# Patient Record
Sex: Female | Born: 1980 | Race: White | Hispanic: No | Marital: Married | Smoking: Never smoker
Health system: Southern US, Community
[De-identification: ages and names within clinical notes are randomized; demographics above are authoritative.]

## PROBLEM LIST (undated history)

## (undated) DIAGNOSIS — H539 Unspecified visual disturbance: Secondary | ICD-10-CM

## (undated) DIAGNOSIS — S83512A Sprain of anterior cruciate ligament of left knee, initial encounter: Secondary | ICD-10-CM

## (undated) DIAGNOSIS — Z98811 Dental restoration status: Secondary | ICD-10-CM

## (undated) DIAGNOSIS — K7689 Other specified diseases of liver: Secondary | ICD-10-CM

## (undated) DIAGNOSIS — Z87442 Personal history of urinary calculi: Secondary | ICD-10-CM

## (undated) HISTORY — DX: Other specified diseases of liver: K76.89

## (undated) HISTORY — DX: Unspecified visual disturbance: H53.9

## (undated) HISTORY — PX: TUBAL LIGATION: SHX77

## (undated) HISTORY — PX: CHOLECYSTECTOMY: SHX55

## (undated) HISTORY — PX: HEMORRHOID SURGERY: SHX153

---

## 2004-05-16 ENCOUNTER — Encounter: Payer: Self-pay | Admitting: Gastroenterology

## 2006-01-01 ENCOUNTER — Encounter: Payer: Self-pay | Admitting: Gastroenterology

## 2008-02-29 ENCOUNTER — Encounter: Payer: Self-pay | Admitting: Gastroenterology

## 2008-03-09 ENCOUNTER — Encounter: Payer: Self-pay | Admitting: Gastroenterology

## 2008-03-20 ENCOUNTER — Encounter: Payer: Self-pay | Admitting: Gastroenterology

## 2008-03-31 ENCOUNTER — Encounter: Payer: Self-pay | Admitting: Gastroenterology

## 2009-03-01 ENCOUNTER — Encounter: Payer: Self-pay | Admitting: Gastroenterology

## 2009-10-31 ENCOUNTER — Encounter: Payer: Self-pay | Admitting: Gastroenterology

## 2010-12-05 ENCOUNTER — Ambulatory Visit (HOSPITAL_COMMUNITY): Admit: 2010-12-05 | Payer: Self-pay | Admitting: Psychology

## 2010-12-10 ENCOUNTER — Emergency Department (HOSPITAL_COMMUNITY)
Admission: EM | Admit: 2010-12-10 | Discharge: 2010-12-10 | Payer: Self-pay | Source: Home / Self Care | Admitting: Family Medicine

## 2010-12-10 ENCOUNTER — Ambulatory Visit (HOSPITAL_COMMUNITY): Admit: 2010-12-10 | Payer: Self-pay | Admitting: Psychology

## 2010-12-11 ENCOUNTER — Encounter (INDEPENDENT_AMBULATORY_CARE_PROVIDER_SITE_OTHER): Payer: Self-pay | Admitting: *Deleted

## 2010-12-16 LAB — POCT URINALYSIS DIPSTICK
Bilirubin Urine: NEGATIVE
Hgb urine dipstick: NEGATIVE
Ketones, ur: NEGATIVE mg/dL
Nitrite: NEGATIVE
Protein, ur: NEGATIVE mg/dL
Specific Gravity, Urine: 1.01 (ref 1.005–1.030)
Urine Glucose, Fasting: NEGATIVE mg/dL
Urobilinogen, UA: 0.2 mg/dL (ref 0.0–1.0)
pH: 6 (ref 5.0–8.0)

## 2010-12-16 LAB — CBC
HCT: 41.6 % (ref 36.0–46.0)
Hemoglobin: 14.1 g/dL (ref 12.0–15.0)
MCH: 29.2 pg (ref 26.0–34.0)
MCHC: 33.9 g/dL (ref 30.0–36.0)
MCV: 86.1 fL (ref 78.0–100.0)
Platelets: 197 10*3/uL (ref 150–400)
RBC: 4.83 MIL/uL (ref 3.87–5.11)
RDW: 14.1 % (ref 11.5–15.5)
WBC: 7.6 10*3/uL (ref 4.0–10.5)

## 2010-12-16 LAB — DIFFERENTIAL
Basophils Absolute: 0 10*3/uL (ref 0.0–0.1)
Basophils Relative: 0 % (ref 0–1)
Eosinophils Absolute: 0.1 10*3/uL (ref 0.0–0.7)
Eosinophils Relative: 1 % (ref 0–5)
Lymphocytes Relative: 14 % (ref 12–46)
Lymphs Abs: 1 10*3/uL (ref 0.7–4.0)
Monocytes Absolute: 0.4 10*3/uL (ref 0.1–1.0)
Monocytes Relative: 5 % (ref 3–12)
Neutro Abs: 6.1 10*3/uL (ref 1.7–7.7)
Neutrophils Relative %: 80 % — ABNORMAL HIGH (ref 43–77)

## 2010-12-16 LAB — POCT I-STAT, CHEM 8
BUN: 11 mg/dL (ref 6–23)
Calcium, Ion: 1.05 mmol/L — ABNORMAL LOW (ref 1.12–1.32)
Chloride: 104 mEq/L (ref 96–112)
Creatinine, Ser: 0.9 mg/dL (ref 0.4–1.2)
Glucose, Bld: 110 mg/dL — ABNORMAL HIGH (ref 70–99)
HCT: 45 % (ref 36.0–46.0)
Hemoglobin: 15.3 g/dL — ABNORMAL HIGH (ref 12.0–15.0)
Potassium: 3.7 mEq/L (ref 3.5–5.1)
Sodium: 138 mEq/L (ref 135–145)
TCO2: 23 mmol/L (ref 0–100)

## 2010-12-17 ENCOUNTER — Other Ambulatory Visit: Payer: Self-pay | Admitting: Family Medicine

## 2010-12-17 ENCOUNTER — Encounter: Payer: Self-pay | Admitting: Family Medicine

## 2010-12-17 ENCOUNTER — Ambulatory Visit: Admission: RE | Admit: 2010-12-17 | Discharge: 2010-12-17 | Payer: Self-pay | Source: Home / Self Care

## 2010-12-17 ENCOUNTER — Ambulatory Visit (HOSPITAL_COMMUNITY): Admit: 2010-12-17 | Payer: Self-pay | Admitting: Psychology

## 2010-12-17 ENCOUNTER — Other Ambulatory Visit
Admission: RE | Admit: 2010-12-17 | Discharge: 2010-12-17 | Payer: Self-pay | Source: Home / Self Care | Admitting: Family Medicine

## 2010-12-17 DIAGNOSIS — F411 Generalized anxiety disorder: Secondary | ICD-10-CM

## 2010-12-17 DIAGNOSIS — E669 Obesity, unspecified: Secondary | ICD-10-CM

## 2010-12-17 HISTORY — DX: Obesity, unspecified: E66.9

## 2010-12-17 HISTORY — DX: Generalized anxiety disorder: F41.1

## 2010-12-18 LAB — CONVERTED CEMR LAB
ALT: 13 units/L (ref 0–35)
AST: 11 units/L (ref 0–37)
Albumin: 4.8 g/dL (ref 3.5–5.2)
Alkaline Phosphatase: 80 units/L (ref 39–117)
BUN: 14 mg/dL (ref 6–23)
CO2: 26 meq/L (ref 19–32)
Calcium: 9.5 mg/dL (ref 8.4–10.5)
Chloride: 105 meq/L (ref 96–112)
Creatinine, Ser: 0.77 mg/dL (ref 0.40–1.20)
Direct LDL: 73 mg/dL
Glucose, Bld: 104 mg/dL — ABNORMAL HIGH (ref 70–99)
Potassium: 4.2 meq/L (ref 3.5–5.3)
Sodium: 139 meq/L (ref 135–145)
TSH: 0.957 microintl units/mL (ref 0.350–4.500)
Total Bilirubin: 0.4 mg/dL (ref 0.3–1.2)
Total Protein: 6.8 g/dL (ref 6.0–8.3)

## 2010-12-26 ENCOUNTER — Encounter: Payer: Self-pay | Admitting: Gastroenterology

## 2010-12-26 ENCOUNTER — Ambulatory Visit
Admission: RE | Admit: 2010-12-26 | Discharge: 2010-12-26 | Payer: Self-pay | Source: Home / Self Care | Attending: Gastroenterology | Admitting: Gastroenterology

## 2010-12-26 DIAGNOSIS — R933 Abnormal findings on diagnostic imaging of other parts of digestive tract: Secondary | ICD-10-CM

## 2010-12-26 DIAGNOSIS — R1013 Epigastric pain: Secondary | ICD-10-CM | POA: Insufficient documentation

## 2010-12-26 HISTORY — DX: Abnormal findings on diagnostic imaging of other parts of digestive tract: R93.3

## 2010-12-27 ENCOUNTER — Ambulatory Visit (HOSPITAL_COMMUNITY)
Admission: RE | Admit: 2010-12-27 | Discharge: 2010-12-27 | Payer: Self-pay | Source: Home / Self Care | Attending: Psychiatry | Admitting: Psychiatry

## 2011-01-02 NOTE — Letter (Signed)
Summary: New Patient letter  [facility]  [address]   [place], [place] [address]   Phone: [phone]  Fax: [phone]       12/11/2010 MRN: [id number]  Katherine Levine [address] [place], [place]  [address]  Dear Ms. Greggory Stallion,  Welcome to the Gastroenterology Division at [facility].    You are scheduled to see Dr.  Arlyce Dice on 12/26/2010 at 8:30am on the 3rd floor at [facility], [address]  We ask that you try to arrive at our office 15 minutes prior to your appointment time to allow for check-in.  We would like you to complete the enclosed self-administered evaluation form prior to your visit and bring it with you on the day of your appointment.  We will review it with you.  Also, please bring a complete list of all your medications or, if you prefer, bring the medication bottles and we will list them.  Please bring your insurance card so that we may make a copy of it.  If your insurance requires a referral to see a specialist, please bring your referral form from your primary care physician.  Co-payments are due at the time of your visit and may be paid by cash, check or credit card.     Your office visit will consist of a consult with your physician (includes a physical exam), any laboratory testing he/she may order, scheduling of any necessary diagnostic testing (e.g. x-ray, ultrasound, CT-scan), and scheduling of a procedure (e.g. Endoscopy, Colonoscopy) if required.  Please allow enough time on your schedule to allow for any/all of these possibilities.    If you cannot keep your appointment, please call [phone] to cancel or reschedule prior to your appointment date.  This allows [place] the opportunity to schedule an appointment for another patient in need of care.  If you do not cancel or reschedule by 5 p.m. the business day prior to your appointment date, you will be charged a $50.00 late cancellation/no-show fee.    Thank you for  choosing [facility] for your medical needs.  We appreciate the opportunity to care for you.  Please visit [place] at our website  to learn more about our practice.                     Sincerely,                                                             The Gastroenterology Division

## 2011-01-02 NOTE — Assessment & Plan Note (Addendum)
Summary: ABD DISCOMFORT..JJ.   History of Present Illness Visit Type: Initial Visit Primary GI MD: Melvia Heaps MD [facility] Primary Provider: Dr Cyndia Skeeters Chief Complaint: Patient c/o 2-3 weeks intermittent epigastric abdominal pain and nausea. She has also had some difficulty swallowing solid foods. She has had increasing chest pain. History of Present Illness:   Mrs. Katherine Levine is a 30 year old white female referred at the request of Dr. Teena Dunk for evaluation of abdominal pain.  Approximately 3 weeks ago she awakened with moderately severe upper abdominal pain  accompanied by nausea.  Since that time the pain has become less severe and intermittent.   She has some dysphagia to solids. She is on no gastric irritants including nonsteroidals.  Patient has a history of focal nodular hyperplasia.  She also complains of new onset constipation over the past year.  She denies melena or hematochezia.   GI Review of Systems    Reports abdominal pain, acid reflux, dysphagia with solids, and  nausea.     Location of  Abdominal pain: epigastric area.    Denies belching, bloating, chest pain, dysphagia with liquids, heartburn, loss of appetite, vomiting, vomiting blood, weight loss, and  weight gain.      Reports change in bowel habits and  constipation.     Denies anal fissure, black tarry stools, diarrhea, diverticulosis, fecal incontinence, hemorrhoids, irritable bowel syndrome, jaundice, light color stool, liver problems, rectal bleeding, and  rectal pain. Preventive Screening-Counseling & Management  Alcohol-Tobacco     Smoking Status: never      Drug Use:  no.      Current Medications (verified): 1)  Prevacid 30 Mg Cpdr (Lansoprazole) .Marland Kitchen.. 1 Tab Daily 2)  Hydroxyzine Hcl 25 Mg Tabs (Hydroxyzine Hcl) .Marland Kitchen.. 1 Tab By Mouth Three Times A Day As Needed For Anxiety  Allergies (verified): 1)  ! Amoxicillin 2)  ! * Zpak 3)  ! * Allegra 4)  ! Demerol  Past History:  Past Medical History: focal  nodular hyperplasia GERD relieved with prevacid Anxiety  Past Surgical History: Reviewed history from 12/17/2010 and no changes required. 2 c/s due to FTP.  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  Alcohol Use - no Illicit Drug Use - no Drug Use:  no  Review of Systems  The patient denies allergy/sinus, anemia, anxiety-new, arthritis/joint pain, back pain, blood in urine, breast changes/lumps, change in vision, confusion, cough, coughing up blood, depression-new, fainting, fatigue, fever, headaches-new, hearing problems, heart murmur, heart rhythm changes, itching, menstrual pain, muscle pains/cramps, night sweats, nosebleeds, pregnancy symptoms, shortness of breath, skin rash, sleeping problems, sore throat, swelling of feet/legs, swollen lymph glands, thirst - excessive , urination - excessive , urination changes/pain, urine leakage, vision changes, and voice change.    Vital Signs:  Patient profile:   30 year old female Height:      66 inches Weight:      181.25 pounds BMI:     29.36 BSA:     1.92 Pulse rate:   76 / minute Pulse rhythm:   regular BP sitting:   106 / 68  (left arm)  Vitals Entered By: Lamona Curl CMA Duncan Dull) (December 26, 2010 8:55 AM)  Physical Exam  Additional Exam:  On physical exam she is well-developed well-nourished female  skin: anicteric HEENT: normocephalic; PEERLA; no nasal or pharyngeal abnormalities neck: supple nodes: no cervical lymphadenopathy chest: clear to ausculatation and percussion heart: no murmurs, gallops, or rubs abd: soft, nontender; BS normoactive; no abdominal  masses, organomegaly; there is mild tenderness to palpation in the upper midepigastrium rectal: deferred ext: no cynanosis, clubbing, edema skeletal: no deformities neuro: oriented x 3; no focal abnormalities    Impression & Recommendations:  Problem # 1:  ABDOMINAL PAIN, EPIGASTRIC (ICD-789.06) Assessment New  Symptoms may be due to ulcer or nonulcer dyspepsia.  H. pylori infection should be ruled out.  Recommendations #1 continue Prevacid #2 upper endoscopy  Risks, alternatives, and complications of the procedure, including bleeding, perforation, and possible need for surgery, were explained to the patient.  Patient's questions were answered.  Orders: EGD (EGD)  Problem # 2:  NONSPECIFIC ABN FINDING RAD & OTH EXAM GI TRACT (ICD-793.4)  Patient has focal nodular hyperplasia by history.  No further workup is required.  I will review her old records.  Oral contraceptives should be avoided.  Orders: EGD (EGD)  Patient Instructions: 1)  Copy sent to : Dr Cyndia Skeeters 2)  Continue Prevacid 3)  Your EGD is scheduled on 01/16/2011 nat 2:30pm 4)  Conscious Sedation brochure given.  5)  Upper Endoscopy brochure given.  6)  The medication list was reviewed and reconciled.  All changed / newly prescribed medications were explained.  A complete medication list was provided to the patient / caregiver.

## 2011-01-02 NOTE — Miscellaneous (Signed)
Summary: ROI: [facility]  ROI: [facility]   Imported By: Knox Royalty 12/26/2010 12:06:23  _____________________________________________________________________  External Attachment:    Type:   Image     Comment:   External Document

## 2011-01-02 NOTE — Letter (Signed)
Summary: Results Letter  [facility]  [address] [place], [place] [address]   Phone: [phone]  Fax: [phone]        December 26, 2010 MRN: [id number]    Katherine Levine [address] [place], [place]  [address]    Dear Ms. Katherine Levine,  It is my pleasure to have treated you recently as a new patient in my office. I appreciate your confidence and the opportunity to participate in your care.  Since I do have a busy inpatient endoscopy schedule and office schedule, my office hours vary weekly. I am, however, available for emergency calls everyday through my office. If I am not available for an urgent office appointment, another one of our gastroenterologist will be able to assist you.  My well-trained staff are prepared to help you at all times. For emergencies after office hours, a physician from our Gastroenterology section is always available through my 24 hour answering service  Once again I welcome you as a new patient and I look forward to a happy and healthy relationship             Sincerely,  Louis Meckel MD  This letter has been electronically signed by your physician.  Appended Document: Results Letter LETTER MAILED

## 2011-01-02 NOTE — Letter (Signed)
Summary: EGD Instructions  [facility]  [address] [place], [place] [address]   Phone: [phone]  Fax: [phone]       Genia Hotter    1981/06/26    MRN: [id number]       Procedure Day /Date:THURSDAY 01/16/2011     Arrival Time: 1:30PM     Procedure Time:2:30PM     Location of Procedure:                    X  [facility] (4th Floor)    PREPARATION FOR ENDOSCOPY   On 01/16/2011 THE DAY OF THE PROCEDURE:  1.   No solid foods, milk or milk products are allowed after midnight the night before your procedure.  2.   Do not drink anything colored red or purple.  Avoid juices with pulp.  No orange juice.  3.  You may drink clear liquids until12:30PM, which is 2 hours before your procedure.                                                                                                CLEAR LIQUIDS INCLUDE: Water Jello Ice Popsicles Tea (sugar ok, no milk/cream) Powdered fruit flavored drinks Coffee (sugar ok, no milk/cream) Gatorade Juice: apple, white grape, white cranberry  Lemonade Clear bullion, consomm, broth Carbonated beverages (any kind) Strained chicken noodle soup Hard Candy   MEDICATION INSTRUCTIONS  Unless otherwise instructed, you should take regular prescription medications with a small sip of water as early as possible the morning of your procedure.           OTHER INSTRUCTIONS  You will need a responsible adult at least 32 years of age to accompany you and drive you home.   This person must remain in the waiting room during your procedure.  Wear loose fitting clothing that is easily removed.  Leave jewelry and other valuables at home.  However, you may wish to bring a book to read or an iPod/MP3 player to listen to music as you wait for your procedure to start.  Remove all body piercing jewelry and leave at home.  Total time from sign-in until discharge is approximately 2-3 hours.  You should go home directly  after your procedure and rest.  You can resume normal activities the day after your procedure.  The day of your procedure you should not:   Drive   Make legal decisions   Operate machinery   Drink alcohol   Return to work  You will receive specific instructions about eating, activities and medications before you leave.    The above instructions have been reviewed and explained to me by   _______________________    I fully understand and can verbalize these instructions _____________________________ Date _________

## 2011-01-02 NOTE — Assessment & Plan Note (Signed)
Summary: CPE/PAP and also want lft check/bmc   Vital Signs:  Patient profile:   30 year old female Weight:      180 pounds Temp:     98.4 degrees F oral Pulse rate:   73 / minute BP sitting:   135 / 84  (right arm) Cuff size:   regular  Vitals Entered By: Garen Grams LPN (December 17, 2010 3:35 PM) CC: CPP Is Patient Diabetic? No Pain Assessment Patient in pain? no        CC:  CPP.  History of Present Illness: 30 yo female here to establish care.  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    1.  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  Had been followed up by [facility] in the past and was having MRI done every six months with stable size of the nodules.   Pt though was symptomatic at first but now has been doing better.  been quite awhile since seen and had an MRI screen.  Pt is scheduled to see [facility] next week to be seen at that time.   2.  Pt states she has these times of overwhelming. Pt states this usually occurs at work and seems to be  situational.  Is not all the time.  Pt had tried a xanax but did not like how tired it made her. Pt would liek to know if there is anything else.   3.  Pt is due for her pap smear today as well.   4.  fatigue-  Pt has noticed she has been much more tired recently, no illnesses, no weight change has had some hair loss no swelling of lower extremities.  Pt has never had her thyroid or LDL checked.   5.  Previcid- for previcid.     Habits & Providers  Alcohol-Tobacco-Diet     Tobacco Status: never  Current Medications (verified): 1)  Prevacid 30 Mg Cpdr (Lansoprazole) .Marland Kitchen.. 1 Tab Daily 2)  Hydroxyzine Hcl 25 Mg Tabs (Hydroxyzine Hcl) .Marland Kitchen.. 1 Tab By Mouth Three Times A Day As Needed For Anxiety  Allergies (verified): No Known Drug Allergies  Past History:  Past Medical History: focal nodular hyperplasia GERD relieved with prevacid  Past  Surgical History: 2 c/s due to FTP.  Gallbladder removed 1996 Tubal ligation 2010  Family History: CAD, DM, pancreatic cancer in 1st degree relatives.  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  Abdomen:  BS+, NT, ND Genitalia:  Pelvic Exam:        External: normal female genitalia without lesions or masses        Vagina: normal without lesions or masses        Cervix: normal without lesions or masses, mild bleeding from os        Adnexa: normal bimanual exam without masses or fullness  Uterus: normal by palpation        Pap smear: performed Pulses:  2+ all extremities Extremities:  no edema Neurologic:  CN 2-12 intact Skin:  no rash or suspicious lesions   Impression & Recommendations:  Problem # 1:  Preventive Health Care (ICD-V70.0) pap collected will get baseline LDL and see how pt is doing.   Problem # 2:  FATIGUE (ICD-780.79) TSH, will get CBC will get CMET and monitor.  Orders: Comp Met-[facility] [phone]) TSH-[facility] [phone])  Problem # 3:  ELEVATED BLOOD PRESSURE WITHOUT DIAGNOSIS OF HYPERTENSION (ICD-796.2) will check again at next visit, if elevated at that time will chweck again.   Problem # 4:  ANXIETY STATE, UNSPECIFIED (ICD-300.00) will attempt hdroxizine, did warn pt that it may make her sleepy but likely less then Benzos.  Her updated medication list for this problem includes:    Hydroxyzine Hcl 25 Mg Tabs (Hydroxyzine hcl) .Marland Kitchen... 1 tab by mouth three times a day as needed for anxiety  Complete Medication  List: 1)  Prevacid 30 Mg Cpdr (Lansoprazole) .Marland Kitchen.. 1 tab daily 2)  Hydroxyzine Hcl 25 Mg Tabs (Hydroxyzine hcl) .Marland Kitchen.. 1 tab by mouth three times a day as needed for anxiety  Other Orders: Direct LDL-[facility] [phone]) Pap Smear-[facility] ([id number])  Patient Instructions: 1)  Nice to meet you 2)  I will call you once I get all the labs back 3)  Please keep [place] informed on if [facility] gets the scan for yuor liver or not 4)  I only need to see you when you need me.   Prescriptions: HYDROXYZINE HCL 25 MG TABS (HYDROXYZINE HCL) 1 tab by mouth three times a day as needed for anxiety  #90 x 1   Entered and Authorized by:   Antoine Primas DO   Signed by:   Antoine Primas DO on 12/17/2010   Method used:   Electronically to        [facility]* (retail)       [address]       [address] Shipping/mailing       [place], [place]  [address]       Ph: [phone]       Fax: [phone]   RxID:   [id number] PREVACID 30 MG CPDR (LANSOPRAZOLE) 1 tab daily  #90 x 3   Entered and Authorized by:   Antoine Primas DO   Signed by:   Antoine Primas DO on 12/17/2010   Method used:   Historical   RxID:   [id number]    Orders Added: 1)  [facility]- New 18-52yrs [[id number]] 2)  Comp Met-[facility] [[id number]] 3)  Direct LDL-[facility] [[id number]] 4)  TSH-[facility] [[id number]] 5)  Pap Smear-[facility] [[id number]] 6)  [facility]- New 18-47yrs [[id number]]

## 2011-01-03 ENCOUNTER — Encounter (HOSPITAL_COMMUNITY): Payer: 59 | Admitting: Psychology

## 2011-01-10 ENCOUNTER — Encounter (INDEPENDENT_AMBULATORY_CARE_PROVIDER_SITE_OTHER): Payer: 59 | Admitting: Psychology

## 2011-01-10 DIAGNOSIS — F4323 Adjustment disorder with mixed anxiety and depressed mood: Secondary | ICD-10-CM

## 2011-01-10 DIAGNOSIS — F4310 Post-traumatic stress disorder, unspecified: Secondary | ICD-10-CM

## 2011-01-16 ENCOUNTER — Other Ambulatory Visit: Payer: Self-pay | Admitting: Gastroenterology

## 2011-01-22 NOTE — Procedures (Signed)
Summary: EGD/John Dan Humphreys MD  EGD/John Dan Humphreys MD   Imported By: Lester [place]urg 01/16/2011 10:25:45  _____________________________________________________________________  External Attachment:    Type:   Image     Comment:   External Document

## 2011-01-22 NOTE — Letter (Signed)
Summary: Ronnald Ramp MD  Ronnald Ramp MD   Imported By: Lester [place] 01/16/2011 10:08:39  _____________________________________________________________________  External Attachment:    Type:   Image     Comment:   External Document

## 2011-01-22 NOTE — Letter (Signed)
Summary: Ronnald Ramp MD  Ronnald Ramp MD   Imported By: Lester [place] 01/16/2011 10:27:29  _____________________________________________________________________  External Attachment:    Type:   Image     Comment:   External Document

## 2011-01-24 ENCOUNTER — Other Ambulatory Visit (AMBULATORY_SURGERY_CENTER): Payer: 59 | Admitting: Gastroenterology

## 2011-01-24 ENCOUNTER — Encounter: Payer: Self-pay | Admitting: Gastroenterology

## 2011-01-24 DIAGNOSIS — K209 Esophagitis, unspecified without bleeding: Secondary | ICD-10-CM

## 2011-01-24 DIAGNOSIS — K222 Esophageal obstruction: Secondary | ICD-10-CM

## 2011-01-24 DIAGNOSIS — R109 Unspecified abdominal pain: Secondary | ICD-10-CM

## 2011-01-24 DIAGNOSIS — R1310 Dysphagia, unspecified: Secondary | ICD-10-CM

## 2011-01-27 ENCOUNTER — Inpatient Hospital Stay (INDEPENDENT_AMBULATORY_CARE_PROVIDER_SITE_OTHER)
Admission: RE | Admit: 2011-01-27 | Discharge: 2011-01-27 | Disposition: A | Discharge status: Home / Self Care | Payer: 59 | Source: Ambulatory Visit | Attending: Family Medicine | Admitting: Family Medicine

## 2011-01-27 ENCOUNTER — Encounter: Payer: Self-pay | Admitting: Gastroenterology

## 2011-01-27 DIAGNOSIS — J069 Acute upper respiratory infection, unspecified: Secondary | ICD-10-CM

## 2011-01-28 NOTE — Procedures (Addendum)
Summary: Upper Endoscopy  Patient: Katherine Levine Note: All result statuses are Final unless otherwise noted.  Tests: (1) Upper Endoscopy (EGD)   EGD Upper Endoscopy       DONE     [facility]     [address]     [place], [place]  [address]           ENDOSCOPY PROCEDURE REPORT           PATIENT:  Katherine Levine  MR#:  [id number]     BIRTHDATE:  02-06-1981, 29 yrs. old  GENDER:  female           ENDOSCOPIST:  Barbette Hair. Arlyce Dice, MD     Referred by:  Cyndia Skeeters, M.D.           PROCEDURE DATE:  01/24/2011     PROCEDURE:  EGD, diagnostic 43235     ASA CLASS:  Class I     INDICATIONS:  abdominal pain, dysphagia           MEDICATIONS:   Fentanyl 75 mcg IV, Versed 9 mg IV, glycopyrrolate     (Robinal) 0.2 mg IV, 0.6cc simethancone 0.6 cc PO     TOPICAL ANESTHETIC:  Exactacain Spray           DESCRIPTION OF PROCEDURE:   After the risks benefits and     alternatives of the procedure were thoroughly explained, informed     consent was obtained.  The LB [id number] [id number] endoscope was     introduced through the mouth and advanced to the third portion of     the duodenum, without limitations.  The instrument was slowly     withdrawn as the mucosa was fully examined.     <<PROCEDUREIMAGES>>           Esophagitis was found at the gastroesophageal junction (see     image8). 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    Retroflexed views revealed no abnormalities.     The scope was then withdrawn from the patient and the procedure     completed.           COMPLICATIONS:  None           ENDOSCOPIC IMPRESSION:     1) Esophagitis at the gastroesophageal junction     2) Stricture at the gastroesophageal junction - s/p maloney     dilitation  3) Otherwise normal examination     RECOMMENDATIONS:     1) continue PPI     2) Call office next 2-3 days to schedule an office appointment     for 1 month           REPEAT EXAM:  No           ______________________________     Barbette Hair. Arlyce Dice, MD           CC:           n.     eSIGNED:   Barbette Hair. Starlyn Droge at 01/24/2011 09:52 AM           Katherine Levine, [id number]  Note: An exclamation mark (!) indicates a result that was not dispersed into the flowsheet. Document Creation  Date: 01/24/2011 9:52 AM _______________________________________________________________________  (1) Order result status: Final Collection or observation date-time: 01/24/2011 09:42 Requested date-time:  Receipt date-time:  Reported date-time:  Referring Physician:   Ordering Physician: Melvia Heaps [phone]) Specimen Source:  Source: Launa Grill Order Number: [id number] Lab site:

## 2011-01-31 ENCOUNTER — Encounter (HOSPITAL_COMMUNITY): Payer: 59 | Admitting: Psychology

## 2011-01-31 DIAGNOSIS — F4310 Post-traumatic stress disorder, unspecified: Secondary | ICD-10-CM

## 2011-02-06 NOTE — Letter (Signed)
Summary: Appt Reminder 2  [facility]  [address]   [place], [place] [address]   Phone: [phone]  Fax: [phone]        January 27, 2011 MRN: [id number]    [facility] [address] [place], [place]  [address]    Dear Ms. Katherine Levine,   You have a return appointment with Dr. Arlyce Dice on 03/24/11 at 9:45am.  Please remember to bring a complete list of the medicines you are taking, your insurance card and your co-pay.  If you have to cancel or reschedule this appointment, please call before 5:00 pm the evening before to avoid a cancellation fee.  If you have any questions or concerns, please call [phone].    Sincerely,    Selinda Michaels RN  Appended Document: Appt Reminder 2 Letter is mailed to the patient's home address

## 2011-02-12 ENCOUNTER — Inpatient Hospital Stay (INDEPENDENT_AMBULATORY_CARE_PROVIDER_SITE_OTHER)
Admission: RE | Admit: 2011-02-12 | Discharge: 2011-02-12 | Disposition: A | Discharge status: Home / Self Care | Payer: 59 | Source: Ambulatory Visit

## 2011-02-12 DIAGNOSIS — B3731 Acute candidiasis of vulva and vagina: Secondary | ICD-10-CM

## 2011-02-12 DIAGNOSIS — B373 Candidiasis of vulva and vagina: Secondary | ICD-10-CM

## 2011-02-12 LAB — POCT URINALYSIS DIPSTICK
Glucose, UA: NEGATIVE mg/dL
Nitrite: NEGATIVE
Protein, ur: 30 mg/dL — AB
Specific Gravity, Urine: 1.02 (ref 1.005–1.030)
Urobilinogen, UA: 1 mg/dL (ref 0.0–1.0)
pH: 6.5 (ref 5.0–8.0)

## 2011-02-12 LAB — WET PREP, GENITAL
Clue Cells Wet Prep HPF POC: NONE SEEN
Trich, Wet Prep: NONE SEEN
WBC, Wet Prep HPF POC: NONE SEEN
Yeast Wet Prep HPF POC: NONE SEEN

## 2011-02-12 LAB — HIV ANTIBODY (ROUTINE TESTING W REFLEX): HIV: NONREACTIVE

## 2011-02-12 LAB — POCT PREGNANCY, URINE: Preg Test, Ur: NEGATIVE

## 2011-02-13 LAB — GC/CHLAMYDIA PROBE AMP, GENITAL
Chlamydia, DNA Probe: NEGATIVE
GC Probe Amp, Genital: NEGATIVE

## 2011-02-21 ENCOUNTER — Encounter (HOSPITAL_COMMUNITY): Payer: 59 | Admitting: Psychology

## 2011-02-21 DIAGNOSIS — F4310 Post-traumatic stress disorder, unspecified: Secondary | ICD-10-CM

## 2011-03-24 ENCOUNTER — Ambulatory Visit: Payer: Commercial Managed Care - PPO | Admitting: Gastroenterology

## 2011-04-18 ENCOUNTER — Encounter (HOSPITAL_COMMUNITY): Payer: 59 | Admitting: Psychology

## 2011-04-18 DIAGNOSIS — F39 Unspecified mood [affective] disorder: Secondary | ICD-10-CM

## 2011-04-27 ENCOUNTER — Emergency Department (HOSPITAL_COMMUNITY)
Admission: EM | Admit: 2011-04-27 | Discharge: 2011-04-27 | Disposition: A | Discharge status: Home / Self Care | Payer: 59 | Attending: Emergency Medicine | Admitting: Emergency Medicine

## 2011-04-27 DIAGNOSIS — M549 Dorsalgia, unspecified: Secondary | ICD-10-CM | POA: Insufficient documentation

## 2011-04-27 DIAGNOSIS — M545 Low back pain, unspecified: Secondary | ICD-10-CM | POA: Insufficient documentation

## 2011-04-27 DIAGNOSIS — Z87442 Personal history of urinary calculi: Secondary | ICD-10-CM | POA: Insufficient documentation

## 2011-04-27 LAB — URINALYSIS, ROUTINE W REFLEX MICROSCOPIC
Bilirubin Urine: NEGATIVE
Glucose, UA: NEGATIVE mg/dL
Hgb urine dipstick: NEGATIVE
Ketones, ur: 15 mg/dL — AB
Nitrite: NEGATIVE
Protein, ur: NEGATIVE mg/dL
Specific Gravity, Urine: 1.018 (ref 1.005–1.030)
Urobilinogen, UA: 0.2 mg/dL (ref 0.0–1.0)
pH: 7 (ref 5.0–8.0)

## 2011-04-27 LAB — PREGNANCY, URINE: Preg Test, Ur: NEGATIVE

## 2011-05-16 ENCOUNTER — Encounter (HOSPITAL_COMMUNITY): Payer: 59 | Admitting: Psychology

## 2011-05-30 ENCOUNTER — Ambulatory Visit (INDEPENDENT_AMBULATORY_CARE_PROVIDER_SITE_OTHER): Payer: Commercial Managed Care - PPO | Admitting: Family Medicine

## 2011-05-30 ENCOUNTER — Encounter (INDEPENDENT_AMBULATORY_CARE_PROVIDER_SITE_OTHER): Payer: Commercial Managed Care - PPO

## 2011-05-30 VITALS — BP 115/76 | HR 68 | Temp 97.6°F | Wt 173.0 lb

## 2011-05-30 DIAGNOSIS — R300 Dysuria: Secondary | ICD-10-CM

## 2011-05-30 LAB — POCT URINALYSIS DIPSTICK
Bilirubin, UA: NEGATIVE
Blood, UA: NEGATIVE
Glucose, UA: NEGATIVE
Ketones, UA: NEGATIVE
Leukocytes, UA: NEGATIVE
Nitrite, UA: NEGATIVE
Protein, UA: 30
Spec Grav, UA: >=1.03
Urobilinogen, UA: 0.2
pH, UA: 6

## 2011-05-30 LAB — POCT UA - MICROSCOPIC ONLY
Bacteria, U Microscopic: 3
Epithelial cells, urine per micros: >20

## 2011-05-30 MED ORDER — CIPROFLOXACIN HCL 250 MG PO TABS: 250.0000 mg | ORAL_TABLET | Freq: Two times a day (BID) | ORAL | Status: AC

## 2011-05-30 NOTE — Assessment & Plan Note (Signed)
See previous one

## 2011-05-30 NOTE — Progress Notes (Signed)
  Subjective:    Patient ID: Katherine Levine, female    DOB: 04-28-1981, 30 y.o.   MRN: [id number]  HPI  DYSURIA  Onset: a few weeks ago.  Was seen at [facility] ER for dysuria and back pain. No treatment given.  Urine was negative  Currently has a vague feeling of having to urinate and frequency.  No back or abdominal pain.  Does have a history of stones about 6 years ago    Symptoms Urgency: yes  Frequency: yes  Hesitancy: no  Hematuria: no  Flank Pain: no  Fever: no   Highest Temp:   Nausea/Vomiting: no  Pregnant: no STD exposure/history: no Discharge: no Irritants: no Rash: no  Red Flags  : (Risk Factors for Complicated UTI) Recent Antibiotic Usage (last 30 days): no  Symptoms lasting more than seven (7) days: yes  More than 3 UTI's last 12 months: no  PMH of  1. DM: no 2. Renal Disease/Calculi: no 3. Urinary Tract Abnormality: no  4. Instrumentation/Trauma: no 5. Immunosuppression: no   Review of Symptoms - see HPI  PMH - Smoking status noted.     Review of Systems     Objective:   Physical Exam  No distress No CVAT No rash Abdomen: soft and non-tender without masses, organomegaly or hernias noted.  No guarding or rebound No suprapubic tenderness       Assessment & Plan:   This encounter was created in error - please disregard.

## 2011-05-30 NOTE — Assessment & Plan Note (Signed)
Acute. Urinalysis suggests poor catch without signs of stone or infection.  Symptoms are suggestive of smoldering infection.  Will treat with Cipro for 5 days.  If does not improve needs a full culture

## 2011-05-30 NOTE — Patient Instructions (Signed)
Drink at least 8 glasses of water for the next few days  If you get fever or severe pain or bleeding or your symptoms are not gone in 1 week come back

## 2011-05-30 NOTE — Progress Notes (Signed)
  Subjective:    Patient ID: Katherine Levine, female    DOB: [birth year], 30 y.o.   MRN: [id number]  Duplicate notes   HPI HPI  DYSURIA  Onset: a few weeks ago. Was seen at [facility] ER for dysuria and back pain. No treatment given. Urine was negative Currently has a vague feeling of having to urinate and frequency. No back or abdominal pain. Does have a history of stones about 6 years ago  Symptoms  Urgency: yes  Frequency: yes  Hesitancy: no  Hematuria: no  Flank Pain: no  Fever: no Highest Temp:  Nausea/Vomiting: no  Pregnant: no  STD exposure/history: no  Discharge: no  Irritants: no  Rash: no  Red Flags : (Risk Factors for Complicated UTI)  Recent Antibiotic Usage (last 30 days): no  Symptoms lasting more than seven (7) days: yes  More than 3 UTI's last 12 months: no  PMH of  1. DM: no  2. Renal Disease/Calculi: no  3. Urinary Tract Abnormality: no  4. Instrumentation/Trauma: no  5. Immunosuppression: no  Review of Symptoms - see HPI  PMH - Smoking status noted.  Review of Systems   Objective:   Physical Exam  No distress  No CVAT  No rash  Abdomen: soft and non-tender without masses, organomegaly or hernias noted. No guarding or rebound  No suprapubic tenderness    Review of Systems     Objective:   Physical Exam        Assessment & Plan:

## 2011-06-01 HISTORY — PX: LIVER RESECTION: SHX1977

## 2011-06-26 ENCOUNTER — Ambulatory Visit (INDEPENDENT_AMBULATORY_CARE_PROVIDER_SITE_OTHER): Payer: Commercial Managed Care - PPO | Admitting: Family Medicine

## 2011-06-26 ENCOUNTER — Encounter: Payer: Self-pay | Admitting: Family Medicine

## 2011-06-26 VITALS — BP 128/89 | HR 60 | Temp 98.1°F | Ht 66.0 in | Wt 172.0 lb

## 2011-06-26 DIAGNOSIS — R300 Dysuria: Secondary | ICD-10-CM

## 2011-06-26 LAB — POCT URINALYSIS DIPSTICK
Bilirubin, UA: NEGATIVE
Blood, UA: NEGATIVE
Glucose, UA: NEGATIVE
Ketones, UA: NEGATIVE
Leukocytes, UA: NEGATIVE
Nitrite, UA: NEGATIVE
Protein, UA: NEGATIVE
Spec Grav, UA: <=1.005
Urobilinogen, UA: 0.2
pH, UA: 6

## 2011-06-26 LAB — POCT WET PREP (WET MOUNT)
Clue Cells Wet Prep HPF POC: NEGATIVE
Trichomonas Wet Prep HPF POC: NEGATIVE
Yeast Wet Prep HPF POC: NEGATIVE

## 2011-06-26 NOTE — Patient Instructions (Signed)
Thank you for coming in today. We will follow up the lab tests today.  I will call you if your results indicate that you have an infection.  See the handout on interstitial cystitis. Try to avoid those foods.  Make an appointment with Dr. Katrinka Blazing or myself in a few weeks.

## 2011-06-26 NOTE — Assessment & Plan Note (Addendum)
I suspect that [name] has interstitial cystitis based on multiple recurrent "UTIs" and frequent symptoms. All labs and exams today are unrevelaing.  Plan: Culture urine to confirm no UTI.  F/u GC/CHL to show no STIs.  Gave literature on IC to the patient. She will review the common provocative foods and avoid ones that are known to make IC worse.  Will f/u in a few weeks with myself or PCP. Will keep symptom log. At that time if still a problem will:  1) Consider referral to pelvic PT. 2) Consider urology referral  3) Consider starting amitriptyline therapy

## 2011-06-26 NOTE — Progress Notes (Signed)
[name] presents to clinic today with dysuria. She notes urinary frequency, urgency, and pain and burning with voiding and after voiding. She also notes these symptoms following sex. She denies any vaginal discharge. No fevers or chills. No blood in her urine.  Has had similar episodes like this in the past. Multiple rounds of UTI symptoms that did not show infection. Was most recently treated for a UTI 1 month ago.   PMH reviewed in E-Chart ROS as above otherwise neg  Exam:  BP 128/89  Pulse 60  Temp(Src) 98.1 F (36.7 C) (Oral)  Ht 5\' 6"  (1.676 m)  Wt 172 lb (78.019 kg)  BMI 27.76 kg/m2  LMP 04/30/2011 Gen: Well NAD Lungs: CTABL Nl WOB Heart: RRR no MRG Abd: NABS, NT, ND Exts: Non edematous BL  LE, warm and well perfused.  GYN: Normal appearing external genitalia. No discharge and normal cervix. No cervical motion tenderness. No adnexal mass or tenderness. Normal sized uterus.   UA: Bland: Wet Prep: Parke Simmers

## 2011-06-27 LAB — GC/CHLAMYDIA PROBE AMP, GENITAL
Chlamydia, DNA Probe: NEGATIVE
GC Probe Amp, Genital: NEGATIVE

## 2011-07-18 ENCOUNTER — Ambulatory Visit (INDEPENDENT_AMBULATORY_CARE_PROVIDER_SITE_OTHER): Payer: 59 | Admitting: Family Medicine

## 2011-07-18 ENCOUNTER — Encounter: Payer: Self-pay | Admitting: Family Medicine

## 2011-07-18 VITALS — BP 122/75 | HR 73 | Temp 98.4°F | Wt 173.0 lb

## 2011-07-18 DIAGNOSIS — N3010 Interstitial cystitis (chronic) without hematuria: Secondary | ICD-10-CM

## 2011-07-20 ENCOUNTER — Encounter: Payer: Self-pay | Admitting: Family Medicine

## 2011-07-20 DIAGNOSIS — N3010 Interstitial cystitis (chronic) without hematuria: Secondary | ICD-10-CM | POA: Insufficient documentation

## 2011-07-20 NOTE — Progress Notes (Signed)
  Subjective:    Patient ID: Katherine Levine, female    DOB: [birth year], 30 y.o.   MRN: [id number]  HPI Pt returns for f/u on dysuria.  Pt has had a good amount of laboratory workup without any significant findings, pt states at the moment the problem is much better and seems to be doing well.  Pt though would like to know why it continues to occur.  Pt states she has tried to change her diet and it may have helped some so far. Pt was seen by Dr. Denyse Amass recently and diagnosed possible IC.  Pt has not gone to pelvic PT, but does not appear to need it.   Pt does give hx of kidney stone some time ago but passed without trouble and had not workup on the stone.      Review of Systems Denies fever, chills, nausea vomiting abdominal pain, dysuria, chest pain, shortness of breath dyspnea on exertion or numbness in extremities, no vaginal discharge, no association of symptoms around her periods which are normal .  Past medical history, social, surgical and family history all reviewed.      Objective:   Physical Exam Gen: Well NAD Lungs: CTABL Nl WOB Heart: RRR no MRG Abd: NABS, NT, ND Exts: No edema 2+ pulses.   GYN: Normal appearing external genitalia. No discharge and normal cervix. No cervical motion tenderness. No adnexal mass or tenderness. Normal sized uterus.         Assessment & Plan:

## 2011-07-20 NOTE — Assessment & Plan Note (Signed)
Discussed with pt options available to her at this time, given handout of things to change around home and home exercises, if worse n to come back and then will refer to urology for work up.  Pt agreed with plan.

## 2011-07-20 NOTE — Patient Instructions (Signed)
Handout given

## 2011-09-15 ENCOUNTER — Ambulatory Visit (INDEPENDENT_AMBULATORY_CARE_PROVIDER_SITE_OTHER): Payer: 59 | Admitting: Family Medicine

## 2011-09-15 ENCOUNTER — Encounter: Payer: Self-pay | Admitting: Family Medicine

## 2011-09-15 VITALS — BP 125/82 | HR 78 | Temp 98.1°F | Ht 66.0 in | Wt 171.9 lb

## 2011-09-15 DIAGNOSIS — J029 Acute pharyngitis, unspecified: Secondary | ICD-10-CM

## 2011-09-15 LAB — POCT RAPID STREP A (OFFICE): Rapid Strep A Screen: NEGATIVE

## 2011-09-15 NOTE — Patient Instructions (Signed)
You have a viral post nasal drip sore throat  Things to try Nasal saline to wash out your nose and throat Salt Water gargles Throat lozenges Clariten daily for any allergic component  If you develop fever or shortness of breath or not better in 4 weeks call us

## 2011-09-15 NOTE — Assessment & Plan Note (Signed)
Viral.  Symptomatic treatment

## 2011-09-15 NOTE — Progress Notes (Signed)
  Subjective:    Patient ID: Katherine Levine, female    DOB: [birth year], 30 y.o.   MRN: [id number]  HPI  SORE THROAT   Onset: 2 days ago although was very hoarse a few weeks ago  Severity: mild Better with: ibuprofen  Symptoms  Fever: no    Cough/URI sxs: yes Myalgias: no Headache: no Rash: no Swollen neck glands: no    Recent Strep Exposure: no LUQ pain: no Heartburn/brash: no Allergy Sxs: yes  Red Flags STD exposure: no Breathing difficulty: no Drooling: no Trismus: No  Does not smoke   Review of Systems     Objective:   Physical ExamNo acute distress Ears:  External ear exam shows no significant lesions or deformities.  Otoscopic examination reveals clear canals, tympanic membranes are intact bilaterally without bulging, retraction, inflammation or discharge. Hearing is grossly normal bilaterall Neck:  No deformities, thyromegaly, masses, or tenderness noted.   Supple with full range of motion without pain. Throat: mucosa is red  no exudate, uvula midline,  Lungs:  Normal respiratory effort, chest expands symmetrically. Lungs are clear to auscultation, no crackles or wheezes. Skin:  Intact without suspicious lesions or rashes        Assessment & Plan:

## 2011-11-10 ENCOUNTER — Encounter (HOSPITAL_COMMUNITY): Payer: Self-pay

## 2011-11-10 ENCOUNTER — Emergency Department (HOSPITAL_COMMUNITY)
Admission: EM | Admit: 2011-11-10 | Discharge: 2011-11-10 | Disposition: A | Discharge status: Home / Self Care | Payer: 59 | Attending: Emergency Medicine | Admitting: Emergency Medicine

## 2011-11-10 DIAGNOSIS — R10816 Epigastric abdominal tenderness: Secondary | ICD-10-CM | POA: Insufficient documentation

## 2011-11-10 DIAGNOSIS — R109 Unspecified abdominal pain: Secondary | ICD-10-CM | POA: Insufficient documentation

## 2011-11-10 DIAGNOSIS — R112 Nausea with vomiting, unspecified: Secondary | ICD-10-CM | POA: Insufficient documentation

## 2011-11-10 LAB — GLUCOSE, CAPILLARY: Glucose-Capillary: 129 mg/dL — ABNORMAL HIGH (ref 70–99)

## 2011-11-10 LAB — URINALYSIS, ROUTINE W REFLEX MICROSCOPIC
Glucose, UA: NEGATIVE mg/dL
Leukocytes, UA: NEGATIVE
Nitrite: NEGATIVE
Protein, ur: NEGATIVE mg/dL

## 2011-11-10 MED ORDER — DIPHENHYDRAMINE HCL 50 MG/ML IJ SOLN
25.0000 mg | Freq: Once | INTRAMUSCULAR | Status: AC
  Administered 2011-11-10: 25 mg via INTRAVENOUS
  Filled 2011-11-10: qty 1

## 2011-11-10 MED ORDER — METOCLOPRAMIDE HCL 5 MG/ML IJ SOLN
10.0000 mg | Freq: Once | INTRAMUSCULAR | Status: AC
  Administered 2011-11-10: 10 mg via INTRAVENOUS
  Filled 2011-11-10: qty 2

## 2011-11-10 MED ORDER — ONDANSETRON HCL 8 MG PO TABS: 8.0000 mg | ORAL_TABLET | Freq: Three times a day (TID) | ORAL | Status: AC | PRN

## 2011-11-10 MED ORDER — PANTOPRAZOLE SODIUM 40 MG IV SOLR
40.0000 mg | Freq: Once | INTRAVENOUS | Status: AC
Start: 2011-11-10 — End: 2011-11-10
  Administered 2011-11-10: 40 mg via INTRAVENOUS
  Filled 2011-11-10: qty 40

## 2011-11-10 NOTE — ED Provider Notes (Signed)
History     CSN: [id number] Arrival date & time: 11/10/2011  2:11 AM   First MD Initiated Contact with Patient 11/10/11 0445      Chief Complaint  Patient presents with  . Abdominal Pain    (Consider location/radiation/quality/duration/timing/severity/associated sxs/prior treatment) HPI This is a 30 year old white female who works as a Licensed conveyancer at [employer]. She was at work several hours ago when she felt the onset of epigastric pain which became severe. She became nauseated and vomited several times. This relieved a lot of the pain but some pain still persists. She took some Zofran with transient relief but the nausea has subsequently returned. She had some mild diarrhea. She attempted to leave work to go home but was too weak to stay standing due to nausea and lightheadedness. Hospital security then escorted her to the ED. She was given a liter of IV fluid bolus per nursing protocol prior to evaluation by myself. She does not know she's had a fever but did feel hot earlier. She's had a mild cough and nasal congestion for the past 2 days.  Past Medical History  Diagnosis Date  . Kidney stone   . Obese   . Renal stone     Past Surgical History  Procedure Date  . Cesarean section   . Tubal ligation     History reviewed. No pertinent family history.  History  Substance Use Topics  . Smoking status: Never Smoker   . Smokeless tobacco: Not on file  . Alcohol Use: No    OB History    Grav Para Term Preterm Abortions TAB SAB Ect Mult Living                  Review of Systems  All other systems reviewed and are negative.    Allergies  Amoxicillin; Azithromycin; Fexofenadine; and Meperidine hcl  Home Medications  No current outpatient prescriptions on file.  BP 106/57  Pulse 88  Temp(Src) 98.9 F (37.2 C) (Oral)  Resp 12  SpO2 100%  LMP 10/27/2011  Physical Exam General: Well-developed, well-nourished female in no acute distress; appearance  consistent with age of record HENT: normocephalic, atraumatic Eyes: pupils equal round and reactive to light; extraocular muscles intact Neck: supple Heart: regular rate and rhythm Lungs: clear to auscultation bilaterally Abdomen: soft; mild epigastric tenderness; nondistended; no masses or hepatosplenomegaly; bowel sounds present Extremities: No deformity; full range of motion Neurologic: Awake, alert; motor function intact in all extremities and symmetric; no facial droop Skin: Warm and dry     ED Course  Procedures (including critical care time)    MDM   Nursing notes and vitals signs, including pulse oximetry, reviewed.  Summary of this visit's results, reviewed by myself:  Labs:  Results for orders placed during the hospital encounter of 11/10/11  URINALYSIS, ROUTINE W REFLEX MICROSCOPIC      Component Value Range   Color, Urine YELLOW  YELLOW    APPearance CLEAR  CLEAR    Specific Gravity, Urine 1.013  1.005 - 1.030    pH 6.0  5.0 - 8.0    Glucose, UA NEGATIVE  NEGATIVE (mg/dL)   Hgb urine dipstick NEGATIVE  NEGATIVE    Bilirubin Urine NEGATIVE  NEGATIVE    Ketones, ur NEGATIVE  NEGATIVE (mg/dL)   Protein, ur NEGATIVE  NEGATIVE (mg/dL)   Urobilinogen, UA 0.2  0.0 - 1.0 (mg/dL)   Nitrite NEGATIVE  NEGATIVE    Leukocytes, UA NEGATIVE  NEGATIVE   GLUCOSE,  CAPILLARY      Component Value Range   Glucose-Capillary 129 (*) 70 - 99 (mg/dL)   Comment 1 Notify RN    PREGNANCY, URINE      Component Value Range   Preg Test, Ur NEGATIVE     7:06 AM Feels better after IV fluid bolus and antiemetic. Suspect early gastroenteritis.      Hanley Seamen, MD 11/10/11 [phone]

## 2011-11-10 NOTE — ED Notes (Signed)
Pt reports feeling symptoms stat this evening- increased as night progressed- zofran taken with relief.  Pt report sudden onset of weakness and nausea just before arrival to ED.  Pt negative for stroke PERRL- GCS 15

## 2011-11-10 NOTE — ED Notes (Signed)
Pt actively vomiting once standing for [facility]

## 2011-11-13 ENCOUNTER — Encounter: Payer: Self-pay | Admitting: Family Medicine

## 2011-11-13 ENCOUNTER — Ambulatory Visit (INDEPENDENT_AMBULATORY_CARE_PROVIDER_SITE_OTHER): Payer: 59 | Admitting: Family Medicine

## 2011-11-13 VITALS — BP 131/85 | HR 86 | Temp 98.1°F | Ht 66.0 in | Wt 172.0 lb

## 2011-11-13 DIAGNOSIS — J329 Chronic sinusitis, unspecified: Secondary | ICD-10-CM | POA: Insufficient documentation

## 2011-11-13 DIAGNOSIS — R300 Dysuria: Secondary | ICD-10-CM

## 2011-11-13 LAB — POCT URINALYSIS DIPSTICK
Blood, UA: NEGATIVE
Ketones, UA: NEGATIVE
Protein, UA: NEGATIVE
Spec Grav, UA: 1.025
pH, UA: 6

## 2011-11-13 LAB — POCT UA - MICROSCOPIC ONLY

## 2011-11-13 MED ORDER — CEPHALEXIN 500 MG PO CAPS: 500.0000 mg | ORAL_CAPSULE | Freq: Two times a day (BID) | ORAL | Status: AC

## 2011-11-13 MED ORDER — PROMETHAZINE HCL 12.5 MG RE SUPP: 12.5000 mg | Freq: Four times a day (QID) | RECTAL | Status: DC | PRN

## 2011-11-13 NOTE — Patient Instructions (Signed)
It is good to see you. I'm sorry or not feeling good. I'm giving you an antibiotic to take for the next 7 days. Your urine looks clean and no signs of infection. This is likely a virus in you still probably have 2-3 days of feeling lousy. If you're not feeling better he should come back next week.

## 2011-11-13 NOTE — Progress Notes (Signed)
Addended by: [place], Kamryn Messineo on: 11/13/2011 10:11 AM   Modules accepted: Orders

## 2011-11-13 NOTE — Progress Notes (Signed)
  Subjective:     Katherine Levine is a 30 y.o. female who presents for evaluation of sinus pain. Symptoms include: congestion, cough, facial pain, frequent clearing of the throat, mouth breathing, nasal congestion, post nasal drip and sore throat. Onset of symptoms was 4 days ago. Symptoms have been gradually worsening since that time. Past history is significant for occasional episodes of bronchitis. Patient is a non-smoker. Patient states for the last one to 2 days she's also complaining of some dysuria denies hematuria denies any shortness of breath but feels like her chest is becoming congested.  The following portions of the patient's history were reviewed and updated as appropriate: allergies, current medications, past family history, past medical history, past social history, past surgical history and problem list.  Review of Systems Pertinent items are noted in HPI.   Objective:    BP 131/85  Pulse 86  Temp(Src) 98.1 F (36.7 C) (Oral)  Ht 5\' 6"  (1.676 m)  Wt 172 lb (78.019 kg)  BMI 27.76 kg/m2  LMP 10/27/2011 General appearance: alert and cooperative Eyes: conjunctivae/corneas clear. PERRL, EOM's intact. Fundi benign. Ears: normal TM's and external ear canals both ears and In patient does have some mild retraction and erythema of the left tympanic membrane Nose: Nares normal. Septum midline. Mucosa normal. No drainage or sinus tenderness., turbinates swollen, inflamed Throat: abnormal findings: mild oropharyngeal erythema Neck: mild anterior cervical adenopathy, no adenopathy, supple, symmetrical, trachea midline, thyroid: normal to inspection and palpation and thyroid not enlarged, symmetric, no tenderness/mass/nodules Lungs: clear to auscultation bilaterally Heart: regular rate and rhythm, S1, S2 normal, no murmur, click, rub or gallop Abdomen: soft, non-tender; bowel sounds normal; no masses,  no organomegaly Pulses: 2+ and symmetric    Assessment:    Acute viral  sinusitis.  patient could potentially be having a bacterial infection 2 with the pain 5 days if not improving.   Plan:    Nasal saline sprays. Ceftin per medication orders.

## 2012-01-20 ENCOUNTER — Encounter: Payer: Self-pay | Admitting: Family Medicine

## 2012-01-20 ENCOUNTER — Ambulatory Visit (INDEPENDENT_AMBULATORY_CARE_PROVIDER_SITE_OTHER): Payer: 59 | Admitting: Family Medicine

## 2012-01-20 VITALS — BP 136/85 | HR 73 | Temp 98.1°F | Ht 66.0 in | Wt 177.0 lb

## 2012-01-20 DIAGNOSIS — R933 Abnormal findings on diagnostic imaging of other parts of digestive tract: Secondary | ICD-10-CM

## 2012-01-20 DIAGNOSIS — K7689 Other specified diseases of liver: Secondary | ICD-10-CM

## 2012-01-20 MED ORDER — OXYCODONE-ACETAMINOPHEN 5-325 MG PO TABS: 1.0000 | ORAL_TABLET | Freq: Three times a day (TID) | ORAL | Status: AC | PRN

## 2012-01-20 NOTE — Assessment & Plan Note (Signed)
Patient has history of this multinodular liver lesions for some time. Patient does not have a gallbladder on the right side, discussed case with radiologist who felt that a CT scan could be done instead of MRI for potential better imaging of vascularization. Patient is agreeable we'll get this findings and see if this is the cause of patient's pain. Patient also given a little bit of Percocet to have on hand to help her with sleeping.

## 2012-01-20 NOTE — Patient Instructions (Signed)
We will get a CT scan of your liver. I'm going to give you a little Percocet to have on hand as well. Probably come back and see me in 2 weeks.

## 2012-01-20 NOTE — Progress Notes (Signed)
  Subjective:    Patient ID: Katherine Levine, female    DOB: 04/08/1981, 31 y.o.   MRN: [id number]  HPI 31 year old female with worsening right upper quadrant pain. Patient has had right upper quadrant pain for a very long time but seems to be getting worse. Patient states she has a history of a multinodular liver diagnosed on MRI back in 2009. Results reviewed to see multiple lesions last one seen on MRI in 2010 being 6 x 6 cm. Patient states that the pain is worse on the right side with a deep inspiration or when she lies down. Patient denies any shortness of breath any cough or any fevers or fatigue. Patient denies pain associated with eating and has had her gallbladder removed previously. Patient also denies any diarrhea constipation or any weight loss out of the ordinary.   Review of Systems As stated above Past medical and surgical history updated.    Objective:   Physical Exam General: No apparent distress alert Cardiovascular: Regular rhythm no murmur Pulmonary: Clear to auscultation bilaterally. Abdomen: Bowel sounds positive tender to palpation over the liver the liver edge is not palpated. No masses appreciated. Extremities: Neurovascularly intact    Assessment & Plan:

## 2012-01-22 ENCOUNTER — Ambulatory Visit (HOSPITAL_COMMUNITY)
Admission: RE | Admit: 2012-01-22 | Discharge: 2012-01-22 | Disposition: A | Discharge status: Home / Self Care | Payer: 59 | Source: Ambulatory Visit | Attending: Family Medicine | Admitting: Family Medicine

## 2012-01-22 ENCOUNTER — Other Ambulatory Visit: Payer: Self-pay | Admitting: Family Medicine

## 2012-01-22 DIAGNOSIS — K7689 Other specified diseases of liver: Secondary | ICD-10-CM | POA: Insufficient documentation

## 2012-01-22 DIAGNOSIS — R933 Abnormal findings on diagnostic imaging of other parts of digestive tract: Secondary | ICD-10-CM

## 2012-01-22 DIAGNOSIS — R1011 Right upper quadrant pain: Secondary | ICD-10-CM | POA: Insufficient documentation

## 2012-01-22 DIAGNOSIS — E669 Obesity, unspecified: Secondary | ICD-10-CM | POA: Insufficient documentation

## 2012-01-22 DIAGNOSIS — K769 Liver disease, unspecified: Secondary | ICD-10-CM

## 2012-01-22 MED ORDER — IOHEXOL 300 MG/ML  SOLN
100.0000 mL | Freq: Once | INTRAMUSCULAR | Status: AC | PRN
  Administered 2012-01-22: 100 mL via INTRAVENOUS

## 2012-01-23 ENCOUNTER — Telehealth: Payer: Self-pay | Admitting: Family Medicine

## 2012-01-23 ENCOUNTER — Other Ambulatory Visit: Payer: Self-pay | Admitting: Family Medicine

## 2012-01-23 DIAGNOSIS — R933 Abnormal findings on diagnostic imaging of other parts of digestive tract: Secondary | ICD-10-CM

## 2012-01-23 MED ORDER — GABAPENTIN 100 MG PO CAPS: 100.0000 mg | ORAL_CAPSULE | Freq: Every day | ORAL | Status: DC

## 2012-01-23 NOTE — Telephone Encounter (Signed)
Called patient back told her results of her scan. Patient is still having significant amount pain. Concerned that this might be some nerve impingement secondary to the inflammation in the area. We'll try low-dose Neurontin. Have patient followup when she has time.the patient red flags to look out for.

## 2012-01-23 NOTE — Telephone Encounter (Signed)
Message copied by Judi Saa on Fri Jan 23, 2012 12:22 PM ------      Message from: Denny Levy L      Created: Thu Jan 22, 2012  7:34 PM       Ian Malkin            So this is pretty interesting---sort of what we expected---I am not sure what all of the things in the d dx the radiologist mentioned ARE---maybe we can sit down and brainstorm at some point--given hte length of time she has known about these,  HCC is still very much low on my list altho I am not sure what lamellar HCC is. Way cool      u have the most interesting stuff!      sara      ----- Message -----         From: Rad Results In Interface         Sent: 01/22/2012  11:22 AM           To: Denny Levy, MD

## 2012-01-29 ENCOUNTER — Telehealth: Payer: Self-pay | Admitting: Family Medicine

## 2012-01-29 DIAGNOSIS — K7689 Other specified diseases of liver: Secondary | ICD-10-CM

## 2012-01-29 NOTE — Telephone Encounter (Signed)
Wants a referral to Dr Cephas Darby - for her [facility]

## 2012-01-30 ENCOUNTER — Other Ambulatory Visit: Payer: Self-pay | Admitting: Family Medicine

## 2012-01-30 DIAGNOSIS — K7689 Other specified diseases of liver: Secondary | ICD-10-CM

## 2012-01-30 NOTE — Telephone Encounter (Signed)
Called pt back told her I think she should go to a hepatologist first.  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  Pt agreeable.  Pt also told to come in at some point for labs draw to check hepatitis panel.

## 2012-02-02 ENCOUNTER — Other Ambulatory Visit: Payer: 59

## 2012-02-02 DIAGNOSIS — K7689 Other specified diseases of liver: Secondary | ICD-10-CM

## 2012-02-02 NOTE — Progress Notes (Signed)
HIV,CMP,HEP PANEL AND HEP C ANTIBODY DONE TODAY Katherine Levine

## 2012-02-03 LAB — COMPREHENSIVE METABOLIC PANEL
ALT: 10 U/L (ref 0–35)
AST: 14 U/L (ref 0–37)
Alkaline Phosphatase: 62 U/L (ref 39–117)
BUN: 13 mg/dL (ref 6–23)
Creat: 0.81 mg/dL (ref 0.50–1.10)
Total Bilirubin: 0.6 mg/dL (ref 0.3–1.2)

## 2012-02-03 LAB — HEPATITIS PANEL, ACUTE
HCV Ab: NEGATIVE
Hep A IgM: NEGATIVE
Hep B C IgM: NEGATIVE
Hepatitis B Surface Ag: NEGATIVE

## 2012-03-12 ENCOUNTER — Encounter: Payer: Self-pay | Admitting: Family Medicine

## 2012-03-12 ENCOUNTER — Emergency Department (HOSPITAL_COMMUNITY)
Admission: EM | Admit: 2012-03-12 | Discharge: 2012-03-12 | Disposition: A | Discharge status: Home / Self Care | Payer: 59 | Attending: Emergency Medicine | Admitting: Emergency Medicine

## 2012-03-12 ENCOUNTER — Telehealth: Payer: Self-pay | Admitting: Family Medicine

## 2012-03-12 ENCOUNTER — Ambulatory Visit (INDEPENDENT_AMBULATORY_CARE_PROVIDER_SITE_OTHER): Payer: 59 | Admitting: Family Medicine

## 2012-03-12 ENCOUNTER — Emergency Department (HOSPITAL_COMMUNITY): Payer: 59

## 2012-03-12 VITALS — BP 122/82 | HR 85 | Temp 98.0°F | Ht 66.0 in | Wt 181.0 lb

## 2012-03-12 DIAGNOSIS — R1011 Right upper quadrant pain: Secondary | ICD-10-CM | POA: Insufficient documentation

## 2012-03-12 DIAGNOSIS — Z9089 Acquired absence of other organs: Secondary | ICD-10-CM | POA: Insufficient documentation

## 2012-03-12 DIAGNOSIS — R1013 Epigastric pain: Secondary | ICD-10-CM | POA: Insufficient documentation

## 2012-03-12 DIAGNOSIS — K769 Liver disease, unspecified: Secondary | ICD-10-CM | POA: Insufficient documentation

## 2012-03-12 HISTORY — DX: Right upper quadrant pain: R10.11

## 2012-03-12 LAB — COMPREHENSIVE METABOLIC PANEL
ALT: 9 U/L (ref 0–35)
Alkaline Phosphatase: 70 U/L (ref 39–117)
BUN: 13 mg/dL (ref 6–23)
CO2: 22 mEq/L (ref 19–32)
Calcium: 9.2 mg/dL (ref 8.4–10.5)
GFR calc Af Amer: >90 mL/min (ref >90)
GFR calc non Af Amer: >90 mL/min (ref >90)
Glucose, Bld: 107 mg/dL — ABNORMAL HIGH (ref 70–99)
Total Protein: 7 g/dL (ref 6.0–8.3)

## 2012-03-12 LAB — LIPASE, BLOOD: Lipase: 25 U/L (ref 11–59)

## 2012-03-12 LAB — URINALYSIS, ROUTINE W REFLEX MICROSCOPIC
Bilirubin Urine: NEGATIVE
Leukocytes, UA: NEGATIVE
Nitrite: NEGATIVE
Specific Gravity, Urine: 1.007 (ref 1.005–1.030)
pH: 7 (ref 5.0–8.0)

## 2012-03-12 LAB — CBC
HCT: 37.5 % (ref 36.0–46.0)
Hemoglobin: 12.8 g/dL (ref 12.0–15.0)
MCH: 29.6 pg (ref 26.0–34.0)
MCHC: 34.1 g/dL (ref 30.0–36.0)
RBC: 4.33 MIL/uL (ref 3.87–5.11)

## 2012-03-12 LAB — HCG, SERUM, QUALITATIVE: Preg, Serum: NEGATIVE

## 2012-03-12 MED ORDER — HYDROMORPHONE HCL PF 1 MG/ML IJ SOLN
1.0000 mg | Freq: Once | INTRAMUSCULAR | Status: AC
  Administered 2012-03-12: 1 mg via INTRAVENOUS
  Filled 2012-03-12: qty 1

## 2012-03-12 MED ORDER — OXYCODONE HCL 5 MG PO TABS: 5.0000 mg | ORAL_TABLET | ORAL | Status: AC | PRN

## 2012-03-12 MED ORDER — ONDANSETRON HCL 4 MG/2ML IJ SOLN
4.0000 mg | Freq: Once | INTRAMUSCULAR | Status: AC
  Administered 2012-03-12: 4 mg via INTRAVENOUS
  Filled 2012-03-12: qty 2

## 2012-03-12 MED ORDER — TRAMADOL HCL 50 MG PO TABS
50.0000 mg | ORAL_TABLET | Freq: Three times a day (TID) | ORAL | Status: DC | PRN
Start: 2012-03-12 — End: 2012-10-19

## 2012-03-12 MED ORDER — IOHEXOL 300 MG/ML  SOLN
100.0000 mL | Freq: Once | INTRAMUSCULAR | Status: AC | PRN
  Administered 2012-03-12: 100 mL via INTRAVENOUS

## 2012-03-12 NOTE — Patient Instructions (Signed)
I am sorry you are not feeling well today. I hope you can get some rest.  I will give you some Tramadol to try. Take Benadryl as needed for sleep. I would also recommend getting some Miralax to help your bowels move.  If your pain gets worse, do not hesitate to be evaluated again at the ED. Also, make an appointment with Dr. Katrinka Blazing next week as needed.  Take care! Maanvi Lecompte M. Karlee Staff, M.D.

## 2012-03-12 NOTE — Telephone Encounter (Signed)
Having pain upper right pain -

## 2012-03-12 NOTE — Telephone Encounter (Signed)
Patient states she went to ED  this AM for severe RUQ pain and  was told to follow up here. States she can't lay down  and has much pain with breathing. Advised to come to office now and will work in.

## 2012-03-12 NOTE — ED Notes (Addendum)
Pt c/o right upper abdominal pain, 10/10, sharp with deep breath and dull at relax position.Onset last night, no nausea, no vomiting, no diarrhea. Pt sts she had tubal ligation and removal of gallbladder in the past. Denied any vaginal discharge or urinary symptoms. Bowel sound present. Tender at the area upon palpation. Pt sts this is the worst pain she ever had. Has multiple liver nodules that are currently undergoing diagnostic testing at [facility]. No SOB

## 2012-03-12 NOTE — Assessment & Plan Note (Signed)
Patient had negative workup today including CT scan. Physical exam is nonfocal. Unsure precisely what is causing her pain. Possible options include musculoskeletal pain (given location along rib), worsening pain of liver nodule, and stool burden causing ?mass effect. Discussed with patient and her husband that the workup was negative, although her pain is truly bothersome. Patient does not like oxycodone. One of her biggest concerns is she cannot lie flat and is very tired. Offered patient Tramadol for pain as an alternative to oxycodone. Also, encouraged her to take miralax to have more regular bowel movements. She can take benadryl as needed for sleep. Patient has an appointment at [facility] on 4/15. She will keep this appointment, but if she gets worse over the weekend or has any change in her symptoms, she will be seen in ED. Patient agrees.

## 2012-03-12 NOTE — Progress Notes (Signed)
Subjective:     Patient ID: Katherine Levine, female   DOB: 1981-06-22, 31 y.o.   MRN: [id number]  HPI Patient is a 31 yo F presenting with RUQ abdominal pain, started acutely last night which has been gradually worsening. Worked 3rd shift last night and had episodes of severe pain. Had to take shallow breaths. With any movement or lying flat the pain is unbearable. Pain radiates somewhat to right side, but otherwise does not radiate. Describes it as a "tissue feel" and can feel something rubbing in that area. Patient has had liver mass for 4 years, followed by [facility]. Previous liver pain is not typically this type of pain. Denies any trauma to the area. Denies nausea and chest pain. Last bowel movement a few days ago which is typical for her.  Evaluated at [facility] ED today. CBC, CMet, UA, lipase within normal limits. Preg test negative. Recent hepatic panel negative. Cholecystectomy at age 45. Had tubal ligation 3 years ago. Given oxycodone at [facility], which does not help. Patient was instructed to follow up with her PCP. Since her pain continued, she came to [facility] for further evaluation.  Review of Systems  Constitutional: Negative for fever and chills.  HENT: Negative for congestion.   Eyes: Negative for visual disturbance.  Respiratory: Positive for shortness of breath (Must take short breaths secondary to pain). Negative for cough and chest tightness.   Cardiovascular: Negative for chest pain and palpitations.  Gastrointestinal: Positive for abdominal pain. Negative for nausea, vomiting, diarrhea, blood in stool and abdominal distention.  Genitourinary: Negative for dysuria.  Skin: Negative for rash.  Neurological: Negative for headaches.       Objective:   Physical Exam  Constitutional: She appears well-developed and well-nourished. She appears distressed (Appears tired and uncomfortable. Sitting very still on exam table).  HENT:  Head: Normocephalic and atraumatic.    Cardiovascular: Normal rate, regular rhythm and normal heart sounds.   Pulmonary/Chest: Effort normal. She has no wheezes. She exhibits no tenderness.  Abdominal: Soft. She exhibits no distension and no mass. There is no rebound and no guarding.       Minimal tenderness noted, although patient points to RUQ along rib, there is no focal finding. Rib is nontender.   Musculoskeletal: She exhibits no edema.  Lymphadenopathy:    She has no cervical adenopathy.       Assessment:     31 yo F with PMH of hepatic mass presenting with acute onset and progressively worsening RUQ pain     Plan:

## 2012-03-12 NOTE — Discharge Instructions (Signed)

## 2012-03-12 NOTE — ED Provider Notes (Signed)
History     CSN: [id number]  Arrival date & time 03/12/12  4098   First MD Initiated Contact with Patient 03/12/12 0732      Chief Complaint  Patient presents with  . Abdominal Pain     The history is provided by the patient and medical records.   the patient reports developing severe epigastric abdominal pain last night.  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  She has scheduled appointments at [facility].  She denies chest pain or shortness of breath.  She's had no fevers or chills.  She denies nausea vomiting or diarrhea.  She denies radiation of her pain to her back or up into her chest.  Her symptoms are worsened by movement and palpation.  Nothing improves her pain.  Her pain is moderate to severe at this time.  She reports the pain was acute in onset last night and has been constant since then.  Past Medical History  Diagnosis Date  . Kidney stone   . Obese   . Renal stone   . Nodule on liver     Since 2009 multinodular as seen on MRI seem to be a benign cause.    Past Surgical History  Procedure Date  . Cesarean section   . Tubal ligation   . Cholecystectomy     When she was 14    No family history on file.  History  Substance Use Topics  . Smoking status: Never Smoker   . Smokeless tobacco: Not on file  . Alcohol Use: No    OB History    Grav Para Term Preterm Abortions TAB SAB Ect Mult Living                  Review of Systems  Gastrointestinal: Positive for abdominal pain.  All other systems reviewed and are negative.    Allergies  Amoxicillin; Azithromycin; Fexofenadine; and Meperidine hcl  Home Medications   Current Outpatient Rx  Name Route Sig Dispense Refill  . GABAPENTIN 100 MG PO CAPS Oral Take 1 capsule (100 mg total) by mouth at bedtime. 90 capsule 3    BP 133/83  Pulse 78  Temp(Src) 97.7 F (36.5 C) (Oral)  Resp 24  SpO2 100%  LMP 03/08/2012  Physical  Exam  Nursing note and vitals reviewed. Constitutional: She is oriented to person, place, and time. She appears well-developed and well-nourished.       Uncomfortable appearing  HENT:  Head: Normocephalic and atraumatic.  Eyes: EOM are normal.  Neck: Normal range of motion.  Cardiovascular: Normal rate, regular rhythm and normal heart sounds.   Pulmonary/Chest: Effort normal and breath sounds normal.  Abdominal: Soft. She exhibits no distension.       Epigastric tenderness without guarding or rebound.  No changes to the overlying skin.  Musculoskeletal: Normal range of motion.  Neurological: She is alert and oriented to person, place, and time.  Skin: Skin is warm and dry.  Psychiatric: She has a normal mood and affect. Judgment normal.    ED Course  Procedures (including critical care time)  Labs Reviewed  COMPREHENSIVE METABOLIC PANEL - Abnormal; Notable for the following:    Glucose, Bld 107 (*)    All other components within normal limits  CBC  LIPASE, BLOOD  HCG, SERUM, QUALITATIVE  URINALYSIS, ROUTINE W REFLEX MICROSCOPIC   Ct Abdomen Pelvis W Contrast  03/12/2012  *RADIOLOGY REPORT*  Clinical  Data: Epigastric pain, right upper quadrant pain.  CT ABDOMEN AND PELVIS WITH CONTRAST  Technique:  Multidetector CT imaging of the abdomen and pelvis was performed following the standard protocol during bolus administration of intravenous contrast.  Contrast: OMNIPAQUE IOHEXOL 300 MG/ML  SOLN  Comparison: 01/22/2012  Findings: Lung bases are clear.  No effusions.  Heart is normal size.  Prior cholecystectomy.  Large mass in the left hepatic lobe measures up to 6 cm and is stable.  Central low density within this area.  The other previously seen hyperattenuating nodules on early arterial phase imaging not as well visualized on today's study. Spleen, pancreas, adrenals and kidneys are unremarkable.  Appendix is visualized and is normal.  Moderate stool burden throughout the colon.   Small bowel is decompressed.  Aorta is normal caliber.  Uterus, adnexa urinary bladder unremarkable.  No acute bony abnormality.  IMPRESSION: Stable 6 cm mass in the left hepatic lobe.  The other smaller lesions cannot be visualized on today's study.  Moderate stool burden throughout the colon.  No acute findings.  Normal appendix.  Original Report Authenticated By: Cyndie Chime, M.D.     1. Epigastric abdominal pain       MDM  The patient's pain is improved at this time.  Her laboratory studies and CT scan demonstrate no significant abnormalities.  Her pain has been an ongoing problem and I believe there is a chronic component to this.  I recommended close followup with her primary care Dr. as well as her local gastroenterologist as well as her hepatologist.  She understands to return to the ER for new or worsening symptoms        Lyanne Co, MD 03/12/12 1117

## 2012-04-05 DIAGNOSIS — D135 Benign neoplasm of extrahepatic bile ducts: Secondary | ICD-10-CM

## 2012-04-05 DIAGNOSIS — D134 Benign neoplasm of liver: Secondary | ICD-10-CM | POA: Insufficient documentation

## 2012-04-05 HISTORY — DX: Benign neoplasm of extrahepatic bile ducts: D13.5

## 2012-04-14 ENCOUNTER — Emergency Department (HOSPITAL_COMMUNITY)
Admission: EM | Admit: 2012-04-14 | Discharge: 2012-04-15 | Disposition: A | Discharge status: Home / Self Care | Payer: 59 | Attending: Emergency Medicine | Admitting: Emergency Medicine

## 2012-04-14 ENCOUNTER — Encounter (HOSPITAL_COMMUNITY): Payer: Self-pay

## 2012-04-14 ENCOUNTER — Emergency Department (HOSPITAL_COMMUNITY): Payer: 59

## 2012-04-14 DIAGNOSIS — M79609 Pain in unspecified limb: Secondary | ICD-10-CM | POA: Insufficient documentation

## 2012-04-14 DIAGNOSIS — E669 Obesity, unspecified: Secondary | ICD-10-CM | POA: Insufficient documentation

## 2012-04-14 DIAGNOSIS — X500XXA Overexertion from strenuous movement or load, initial encounter: Secondary | ICD-10-CM | POA: Insufficient documentation

## 2012-04-14 MED ORDER — IBUPROFEN 200 MG PO TABS
400.0000 mg | ORAL_TABLET | Freq: Once | ORAL | Status: AC
  Administered 2012-04-14: 400 mg via ORAL
  Filled 2012-04-14: qty 2

## 2012-04-14 NOTE — ED Notes (Signed)
Pt was doing stretches at the gym and heard her right leg pop three times

## 2012-04-14 NOTE — ED Provider Notes (Signed)
History     CSN: [id number]  Arrival date & time 04/14/12  2032   First MD Initiated Contact with Patient 04/14/12 2254      Chief Complaint  Patient presents with  . Right Leg Pain     (Consider location/radiation/quality/duration/timing/severity/associated sxs/prior treatment) HPI Comments: Patient with no significant past medical history presents emergency Department with a chief complaint of right leg pain.  Onset of symptoms began today around 4 clot in the afternoon while at the gym.  She states that when she was stretching she took her heel and her right hand and pulled her heel up to her head in a split when she felt her right hip pop 3 times.  Patient has had difficulty ambulating ever since the incident and has associated hamstring pain as well.  Pain is rated at a 8/10 in severity.  She attempted taking 800 Motrin with no relief.  Patient states or driving home.  Patient denies any numbness or tingling of the extremity as well as weakness. No other complaints at this time.  The history is provided by the patient.    Past Medical History  Diagnosis Date  . Kidney stone   . Obese   . Renal stone   . Nodule on liver     Since 2009 multinodular as seen on MRI seem to be a benign cause.    Past Surgical History  Procedure Date  . Cesarean section   . Tubal ligation   . Cholecystectomy     When she was 14    History reviewed. No pertinent family history.  History  Substance Use Topics  . Smoking status: Never Smoker   . Smokeless tobacco: Not on file  . Alcohol Use: No    OB History    Grav Para Term Preterm Abortions TAB SAB Ect Mult Living                  Review of Systems  Constitutional: Negative for fever, chills and appetite change.  HENT: Negative for congestion.   Eyes: Negative for visual disturbance.  Respiratory: Negative for shortness of breath.   Cardiovascular: Negative for chest pain and leg swelling.  Gastrointestinal: Negative for  abdominal pain.  Genitourinary: Negative for dysuria, urgency and frequency.  Musculoskeletal: Positive for arthralgias and gait problem.  Neurological: Negative for dizziness, syncope, weakness, light-headedness, numbness and headaches.  Psychiatric/Behavioral: Negative for confusion.  All other systems reviewed and are negative.    Allergies  Amoxicillin; Azithromycin; Fexofenadine; and Meperidine hcl  Home Medications   Current Outpatient Rx  Name Route Sig Dispense Refill  . IBUPROFEN 800 MG PO TABS Oral Take 800 mg by mouth every 8 (eight) hours as needed. For pain.    . TRANEXAMIC ACID 650 MG PO TABS Oral Take 1,300 mg by mouth 3 (three) times daily as needed. Only takes when on menstrual period      BP 129/78  Pulse 76  Temp(Src) 98.3 F (36.8 C) (Oral)  Resp 20  Wt 179 lb (81.194 kg)  SpO2 99%  LMP 03/31/2012  Physical Exam  Nursing note and vitals reviewed. Constitutional: She is oriented to person, place, and time. She appears well-developed and well-nourished. No distress.  HENT:  Head: Normocephalic and atraumatic.  Eyes: Conjunctivae and EOM are normal.  Neck: Normal range of motion.  Cardiovascular:       Intact distal pulses  Pulmonary/Chest: Effort normal.  Musculoskeletal: Normal range of motion.  strength 5 out of 5 bilaterally.  Tenderness to palpation of right hip and groin but no pain with internal or external rotation of hip.  Normal flexion and extension of right leg.  No bony tenderness to palpation.  Neurological: She is alert and oriented to person, place, and time.  Skin: Skin is warm and dry. No rash noted. She is not diaphoretic.  Psychiatric: She has a normal mood and affect. Her behavior is normal.    ED Course  Procedures (including critical care time)  Labs Reviewed - No data to display No results found.   No diagnosis found.    MDM  Extremity pain likely muscular  Patient X-Ray negative for obvious fracture or  dislocation. Pain managed in ED. Pt advised to follow up with orthopedics if symptoms persist. Conservative therapy recommended and discussed. Patient will be dc home & is agreeable with above plan.         Jaci Carrel, [place] 04/14/12 2358

## 2012-04-15 MED ORDER — HYDROCODONE-ACETAMINOPHEN 5-325 MG PO TABS: 1.0000 | ORAL_TABLET | Freq: Four times a day (QID) | ORAL | Status: AC | PRN

## 2012-04-15 MED ORDER — IBUPROFEN 600 MG PO TABS: 600.0000 mg | ORAL_TABLET | Freq: Four times a day (QID) | ORAL | Status: AC | PRN

## 2012-04-15 NOTE — Discharge Instructions (Signed)
Take medications as prescribed. Followup with your doctor in regards to your hospital visit. If you do not have a doctor use the resource list below to help he find one. You may return to the emergency department if symptoms worsen, become progressive, or become more concerning.    RESOURCE GUIDE  Dental Problems  Patients with Medicaid: [facility] [phone] [address]                                           [phone] [address] Phone:  [phone]                                                  Phone:  [phone]  If unable to pay or uninsured, contact:  [facility] or [facility]. to become qualified for the adult dental clinic.  Chronic Pain Problems Contact [facility]  [phone] Patients need to be referred by their primary care doctor.  Insufficient Money for Medicine Contact [employer]:  call "211" or [facility] 478-823-4258.  No Primary Care Doctor Call [facility]  ([phone] Other agencies that provide inexpensive medical care    [facility]  [phone]    [facility]  [phone]    [facility]  [phone]    [facility]  [phone]    [facility]  [phone]    [facility]  [phone]  Psychological Services [facility]  [phone] [facility] Services  [phone] [facility] Mental Health   [phone] (emergency services [phone])  Substance Abuse Resources [facility] Services  [phone] [facility] (438)670-4660 [facility] [place] [phone] Floydene Flock [phone] Residential & Outpatient Substance Abuse Program  [phone]  Abuse/Neglect [facility] Child Abuse Hotline 920-676-2661 [facility] Child Abuse Hotline 727-156-4960 (After Hours)  Emergency Shelter [facility] [phone]  Maternity Homes Room at the [place] [facility] ([phone] [facility] [phone]  MRSA Hotline #:   609-544-0112    [facility] Resources  [facility]     [employer]                          [facility] Dept. [address] [place]                       [address]      [address]  [place]                                                Cristobal Goldmann Phone:  [phone]  Phone:  [phone]                 Phone:  [phone]  [facility] Mental Health Phone:  [phone]  [facility] Child Abuse Hotline [phone] (After Hours)

## 2012-04-15 NOTE — ED Provider Notes (Signed)
Medical screening examination/treatment/procedure(s) were performed by non-physician practitioner and as supervising physician I was immediately available for consultation/collaboration.   Celene Kras, MD 04/15/12 0000

## 2012-07-09 ENCOUNTER — Telehealth: Payer: Self-pay | Admitting: Family Medicine

## 2012-07-09 ENCOUNTER — Inpatient Hospital Stay (HOSPITAL_COMMUNITY)
Admission: EM | Admit: 2012-07-09 | Discharge: 2012-07-10 | DRG: 864 | Disposition: A | Discharge status: Other Acute Inpatient Hospital | Payer: 59 | Attending: Family Medicine | Admitting: Family Medicine

## 2012-07-09 ENCOUNTER — Encounter (HOSPITAL_COMMUNITY): Payer: Self-pay | Admitting: Emergency Medicine

## 2012-07-09 ENCOUNTER — Emergency Department (HOSPITAL_COMMUNITY): Payer: 59

## 2012-07-09 ENCOUNTER — Ambulatory Visit (INDEPENDENT_AMBULATORY_CARE_PROVIDER_SITE_OTHER): Payer: 59 | Admitting: Family Medicine

## 2012-07-09 VITALS — BP 130/70 | HR 108 | Temp 99.7°F | Wt 184.6 lb

## 2012-07-09 DIAGNOSIS — D649 Anemia, unspecified: Secondary | ICD-10-CM

## 2012-07-09 DIAGNOSIS — L989 Disorder of the skin and subcutaneous tissue, unspecified: Secondary | ICD-10-CM | POA: Diagnosis present

## 2012-07-09 DIAGNOSIS — R1011 Right upper quadrant pain: Secondary | ICD-10-CM | POA: Diagnosis present

## 2012-07-09 DIAGNOSIS — T3795XA Adverse effect of unspecified systemic anti-infective and antiparasitic, initial encounter: Secondary | ICD-10-CM | POA: Diagnosis present

## 2012-07-09 DIAGNOSIS — R509 Fever, unspecified: Principal | ICD-10-CM | POA: Diagnosis present

## 2012-07-09 DIAGNOSIS — R109 Unspecified abdominal pain: Secondary | ICD-10-CM

## 2012-07-09 DIAGNOSIS — R945 Abnormal results of liver function studies: Secondary | ICD-10-CM | POA: Diagnosis present

## 2012-07-09 DIAGNOSIS — Y9210 Unspecified residential institution as the place of occurrence of the external cause: Secondary | ICD-10-CM | POA: Diagnosis present

## 2012-07-09 DIAGNOSIS — T7840XA Allergy, unspecified, initial encounter: Secondary | ICD-10-CM | POA: Diagnosis not present

## 2012-07-09 DIAGNOSIS — K7689 Other specified diseases of liver: Secondary | ICD-10-CM | POA: Diagnosis present

## 2012-07-09 LAB — CBC WITH DIFFERENTIAL/PLATELET
Hemoglobin: 9.7 g/dL — ABNORMAL LOW (ref 12.0–15.0)
Lymphocytes Relative: 20 % (ref 12–46)
Lymphs Abs: 2.4 10*3/uL (ref 0.7–4.0)
Monocytes Relative: 6 % (ref 3–12)
Neutro Abs: 8.4 10*3/uL — ABNORMAL HIGH (ref 1.7–7.7)
Neutrophils Relative %: 72 % (ref 43–77)
RBC: 3.34 MIL/uL — ABNORMAL LOW (ref 3.87–5.11)
WBC: 11.6 10*3/uL — ABNORMAL HIGH (ref 4.0–10.5)

## 2012-07-09 LAB — COMPREHENSIVE METABOLIC PANEL
ALT: 41 U/L — ABNORMAL HIGH (ref 0–35)
Alkaline Phosphatase: 120 U/L — ABNORMAL HIGH (ref 39–117)
BUN: 6 mg/dL (ref 6–23)
Chloride: 101 mEq/L (ref 96–112)
GFR calc Af Amer: >90 mL/min (ref >90)
Glucose, Bld: 93 mg/dL (ref 70–99)
Potassium: 4.8 mEq/L (ref 3.5–5.1)
Sodium: 137 mEq/L (ref 135–145)
Total Bilirubin: 0.4 mg/dL (ref 0.3–1.2)

## 2012-07-09 LAB — URINALYSIS, ROUTINE W REFLEX MICROSCOPIC
Bilirubin Urine: NEGATIVE
Hgb urine dipstick: NEGATIVE
Ketones, ur: NEGATIVE mg/dL
Nitrite: NEGATIVE
Urobilinogen, UA: 0.2 mg/dL (ref 0.0–1.0)
pH: 8 (ref 5.0–8.0)

## 2012-07-09 LAB — PHOSPHORUS: Phosphorus: 3.6 mg/dL (ref 2.3–4.6)

## 2012-07-09 MED ORDER — ONDANSETRON HCL 4 MG/2ML IJ SOLN
4.0000 mg | Freq: Once | INTRAMUSCULAR | Status: AC
  Administered 2012-07-09: 4 mg via INTRAVENOUS
  Filled 2012-07-09: qty 2

## 2012-07-09 MED ORDER — SODIUM CHLORIDE 0.9 % IV BOLUS (SEPSIS)
1000.0000 mL | Freq: Once | INTRAVENOUS | Status: AC
  Administered 2012-07-09: 1000 mL via INTRAVENOUS

## 2012-07-09 MED ORDER — HYDROMORPHONE HCL PF 1 MG/ML IJ SOLN
1.0000 mg | Freq: Once | INTRAMUSCULAR | Status: AC
  Administered 2012-07-09: 1 mg via INTRAVENOUS
  Filled 2012-07-09: qty 1

## 2012-07-09 MED ORDER — IOHEXOL 300 MG/ML  SOLN
20.0000 mL | INTRAMUSCULAR | Status: AC
  Administered 2012-07-09 (×2): 20 mL via ORAL

## 2012-07-09 MED ORDER — ACETAMINOPHEN 325 MG PO TABS
650.0000 mg | ORAL_TABLET | Freq: Once | ORAL | Status: AC
  Administered 2012-07-09: 650 mg via ORAL
  Filled 2012-07-09 (×2): qty 2

## 2012-07-09 MED ORDER — SODIUM CHLORIDE 0.9 % IV SOLN
Freq: Once | INTRAVENOUS | Status: AC
  Administered 2012-07-09: via INTRAVENOUS

## 2012-07-09 NOTE — Telephone Encounter (Addendum)
Patient reports surgery for  benign liver lesion 06/22/12 at [facility].Marland Kitchen  Four days ago started with fever. 101-102. Has had fever off and on since then . Has been in contact with [facility] and  was there yesterday for evaluation. Could find no problem due to surgery. On trip back from [facility]  yesterday  had  chills.  Last night started with pain in upper right scapula area through to breast. No shortness of breath but uncomfortable when she takes a deep breath. Temp now 99.9 but she feels it it going up.   Just doesn't want to travel back to [facility] today , doesn't feel she can make the trip.  Taking Dilaudid for surgery pain and can feel the pain  In upper back even with the medication.Marland Kitchen Consulted with Dr. Earnest Bailey and  appointment is scheduled for today to come in for work in appointment  today at 1:30

## 2012-07-09 NOTE — ED Notes (Signed)
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

## 2012-07-09 NOTE — Assessment & Plan Note (Addendum)
Unclear etiology, no obvious source.  No travel, bites, or sick contacts.  PE unremarkable with exception of obvious chills, warm/diaphroetic skin, well healing surgical scars on abdomen, TTP of abdomen, and palpable liver.  Will send to ED for CT and w/ contrast to r/o liver abscess 2/2 recent procedure at [facility].  +/- CXR for this shoulder/back/chest pain (likely referred diaphragm irritation 2/2 recent liver bx).  I would be curious if she has elevated WBC and if BMET is WNL.  Pt did require PRBC txf at [facility] after procedure, but this is unlikely to be contributing to fevers at this time since it was 2 weeks ago (procedure was 06/22/12)

## 2012-07-09 NOTE — ED Notes (Addendum)
Pt.  And family Updated on plan of care. Aware that contrast needs to be in her system for approx 2hrs prior to scan.

## 2012-07-09 NOTE — Patient Instructions (Signed)
It was nice to meet you today.  I'm sorry you feel so badly.  Please go to the [facility] ER so that you can have a scan of your abdomen (specificaly your liver) to make sure there is not an infection.  You will also get some labs drawn.  They may also want to get a chest xray because of this shoulder pain.   Based on that scan, the ER will determine if you need to be admitted (if there is an obvious abscess or infection) or if you can go home and try to get some rest there.

## 2012-07-09 NOTE — Progress Notes (Signed)
S: Pt comes in today for SDA for fever/chills.  Pt had liver bx done by Dr. Rush Barer at [facility] 06/22/12 for benign liver lesion.  Has been having fevers (101-102) off and on for the past 4 days. Was evaluated at [facility] yesterday, and no surgical cause for her fevers was found (per pt and husband all they did was put on her abdomen and give her more pain medication).  Had significant chills when leaving the office.  Started having pain in her right upper back last night, from breast straight through to back.  Pain is worse with deep breaths.  Is on dilaudid from surgery for pain, but this is not helping shoulder/breast pain.  Having chills, feels weak.  Abdominal pain is unchanged from when she had the procedure done.  No N/V/D.  No cough or SOB, no chest pain.  Pain is mostly in her breast.  No sick contacts.  Did require PRBC transfusion during the procedure.  No travel out of the country.  Has not been outside, no tick bites that she knows of.     ROS: Per HPI  History  Smoking status  . Never Smoker   Smokeless tobacco  . Not on file    O:  Filed Vitals:   07/09/12 1342  BP: 130/70  Pulse: 108  Temp: 99.7 F (37.6 C)  Temp recheck: 100.9 --> 99.8  Gen: NAD but looks uncomfortable, chills CV: regular, mild tachycardia, no murmur Pulm: CTA bilat, no wheezes or crackles, breath sounds through out Abd: soft, well healing surgical scars, mild TTP, liver palpable Ext: Warm, no edema   A/P: 31 y.o. female p/w fever of unknown origin, possible liver abscess -See problem list -go to ED

## 2012-07-09 NOTE — ED Notes (Signed)
Page to IV Team.

## 2012-07-09 NOTE — Telephone Encounter (Signed)
Having chills and fever & is asking to be seen today

## 2012-07-09 NOTE — ED Notes (Signed)
Family med doctor called about this pts wait.  Wait time given for the ed and the number waiting.  The doctor wants the  Pt to have a c-t of the abd.

## 2012-07-09 NOTE — ED Notes (Signed)
IV team at bedside 

## 2012-07-09 NOTE — Telephone Encounter (Signed)
Patient called to let me know that she is in the waiting room of the ED. She has been there since 4:00 PM this afternoon. She is feeling febrile and weak and would like to know why she hasn't been evaluated. I called the charge nurse to discuss the patient. The charge nurse told me that there were 19 people waiting and that the patient was number 4 in line. I called the patient and discussed her situation for longer than 15 minutes and encouraged her to wait to be evaluated by and ED physician. She was agreeable to this plan.

## 2012-07-09 NOTE — ED Notes (Addendum)
Pt. Reports liver resection at [facility]. Discharges last Wed. Reports fever and chills off x 5 day. "Highest fever 102. 2." Relieved by taking Tylneol  1000mg .Current temp 99.41F.  Denies nausea/vomiting.  Abdomen is tender over liver and incision site in the super pubic area. Reports pain in her back., over the right scapula.  A.O x 4. Sitting up in bed. Respirations even and regular. NAD.

## 2012-07-09 NOTE — ED Notes (Signed)
Patient in waiting room. Told nurse that she is feeling worse. V/S taken and is wnl. Pt aaox4, resp e/u, nad noted at this time. Pt appears uncomfortable. Pt then tells RN that she has been waiting for four hours and needs to be seen. Explained delay to patient. Pt verbalized understanding. Pt now on cell phone and drinking sprite.

## 2012-07-09 NOTE — ED Provider Notes (Addendum)
History     CSN: [id number]  Arrival date & time 07/09/12  1550   First MD Initiated Contact with Patient 07/09/12 2156      Chief Complaint  Patient presents with  . Fever    (Consider location/radiation/quality/duration/timing/severity/associated sxs/prior treatment) Patient is a 31 y.o. female presenting with fever. The history is provided by the patient.  Fever Primary symptoms of the febrile illness include fever.  She had admitted to [facility] on July 23 for biopsy of a liver mass. This was complicated by hemorrhage which required a laparotomy and she was discharged 9 days ago. She required emergent transfusion because of a bleeding during the biopsy, and she thinks that her discharge hemoglobin was 9.6. Over the last 4 days, she has had fever, chills, sweats. She denies sore throat, cough, nausea, vomiting, diarrhea, dysuria. Fevers been as high as 102.2. She gets temporary relief of fever from acetaminophen. She's been taking tramadol and hydromorphone for postoperative pain. She was seen in the family practice clinic office day and since that ED to obtain a CT scan of her abdomen looking for occult causes of infection.  Past Medical History  Diagnosis Date  . Kidney stone   . Obese   . Renal stone   . Nodule on liver     Since 2009 multinodular as seen on MRI seem to be a benign cause.    Past Surgical History  Procedure Date  . Cesarean section   . Tubal ligation   . Cholecystectomy     When she was 14    History reviewed. No pertinent family history.  History  Substance Use Topics  . Smoking status: Never Smoker   . Smokeless tobacco: Not on file  . Alcohol Use: No    OB History    Grav Para Term Preterm Abortions TAB SAB Ect Mult Living                  Review of Systems  Constitutional: Positive for fever.  All other systems reviewed and are negative.    Allergies  Amoxicillin; Azithromycin; Fexofenadine; and Meperidine hcl  Home  Medications   Current Outpatient Rx  Name Route Sig Dispense Refill  . ACETAMINOPHEN 500 MG PO TABS Oral Take 1,000 mg by mouth every 6 (six) hours as needed. For fever    . DIPHENHYDRAMINE HCL 25 MG PO TABS Oral Take 25 mg by mouth at bedtime as needed. For allergies/sleep    . HYDROMORPHONE HCL 2 MG PO TABS Oral Take 2 mg by mouth every 6 (six) hours.    . TRAMADOL HCL 50 MG PO TABS Oral Take 50 mg by mouth every 8 (eight) hours as needed. For pain    . TRANEXAMIC ACID 650 MG PO TABS Oral Take 1,300 mg by mouth 3 (three) times daily as needed. Only takes when on menstrual period      BP 130/77  Pulse 106  Temp 99.4 F (37.4 C) (Oral)  Resp 18  SpO2 97%  LMP 06/27/2012  Physical Exam  Nursing note and vitals reviewed. 31year old female, resting comfortably and in no acute distress. Vital signs are significant for fever with temperature 100.8. Oxygen saturation is 100%, which is normal. Head is normocephalic and atraumatic. PERRLA, EOMI. Oropharynx is clear. Neck is nontender and supple without adenopathy or JVD. Back is nontender and there is no CVA tenderness. Lungs are clear without rales, wheezes, or rhonchi. Chest is nontender. Heart has regular rate and rhythm  without murmur. Abdomen is soft, flat, nontender without masses or hepatosplenomegaly and peristalsis is normoactive. Extremities have no cyanosis or edema, full range of motion is present. Skin is warm and dry without rash. Neurologic: Mental status is normal, cranial nerves are intact, there are no motor or sensory deficits.   ED Course  Procedures (including critical care time)  Results for orders placed during the hospital encounter of 07/09/12  CBC WITH DIFFERENTIAL      Component Value Range   WBC 11.6 (*) 4.0 - 10.5 K/uL   RBC 3.34 (*) 3.87 - 5.11 MIL/uL   Hemoglobin 9.7 (*) 12.0 - 15.0 g/dL   HCT 16.1 (*) 09.6 - 04.5 %   MCV 86.2  78.0 - 100.0 fL   MCH 29.0  26.0 - 34.0 pg   MCHC 33.7  30.0 - 36.0  g/dL   RDW 40.9  81.1 - 91.4 %   Platelets 371  150 - 400 K/uL   Neutrophils Relative 72  43 - 77 %   Neutro Abs 8.4 (*) 1.7 - 7.7 K/uL   Lymphocytes Relative 20  12 - 46 %   Lymphs Abs 2.4  0.7 - 4.0 K/uL   Monocytes Relative 6  3 - 12 %   Monocytes Absolute 0.7  0.1 - 1.0 K/uL   Eosinophils Relative 1  0 - 5 %   Eosinophils Absolute 0.1  0.0 - 0.7 K/uL   Basophils Relative 0  0 - 1 %   Basophils Absolute 0.0  0.0 - 0.1 K/uL  COMPREHENSIVE METABOLIC PANEL      Component Value Range   Sodium 137  135 - 145 mEq/L   Potassium 4.8  3.5 - 5.1 mEq/L   Chloride 101  96 - 112 mEq/L   CO2 26  19 - 32 mEq/L   Glucose, Bld 93  70 - 99 mg/dL   BUN 6  6 - 23 mg/dL   Creatinine, Ser 7.82  0.50 - 1.10 mg/dL   Calcium 9.2  8.4 - 95.6 mg/dL   Total Protein 7.2  6.0 - 8.3 g/dL   Albumin 3.4 (*) 3.5 - 5.2 g/dL   AST 22  0 - 37 U/L   ALT 41 (*) 0 - 35 U/L   Alkaline Phosphatase 120 (*) 39 - 117 U/L   Total Bilirubin 0.4  0.3 - 1.2 mg/dL   GFR calc non Af Amer >90  >90 mL/min   GFR calc Af Amer >90  >90 mL/min  PHOSPHORUS      Component Value Range   Phosphorus 3.6  2.3 - 4.6 mg/dL  URINALYSIS, ROUTINE W REFLEX MICROSCOPIC      Component Value Range   Color, Urine YELLOW  YELLOW   APPearance CLEAR  CLEAR   Specific Gravity, Urine 1.009  1.005 - 1.030   pH 8.0  5.0 - 8.0   Glucose, UA NEGATIVE  NEGATIVE mg/dL   Hgb urine dipstick NEGATIVE  NEGATIVE   Bilirubin Urine NEGATIVE  NEGATIVE   Ketones, ur NEGATIVE  NEGATIVE mg/dL   Protein, ur NEGATIVE  NEGATIVE mg/dL   Urobilinogen, UA 0.2  0.0 - 1.0 mg/dL   Nitrite NEGATIVE  NEGATIVE   Leukocytes, UA NEGATIVE  NEGATIVE  POCT PREGNANCY, URINE      Component Value Range   Preg Test, Ur NEGATIVE  NEGATIVE   Dg Chest 2 View  07/09/2012  *RADIOLOGY REPORT*  Clinical Data: Right-sided chest pain  CHEST - 2 VIEW  Comparison: None.  Findings: Stable cardiac silhouette.  There is elevation right hemidiaphragm. Trace pleural effusions.  No infiltrate  or pneumothorax.  Prior cholecystectomy.  IMPRESSION: 1. Trace pleural effusions.   2.  Elevation of the right hemidiaphragm.  Original Report Authenticated By: Genevive Bi, M.D.      1. Fever       MDM  Fever of undetermined origin. Chest x-ray and CT scan will be obtained. Hemoglobin is stable compared with her discharge hemoglobin. Mild elevation of LFTs is unlikely to be of any clinical significance. No evidence of urinary tract infection. Consider possibility of occult intra-abdominal abscess.        Dione Booze, MD 07/09/12 2213  Workup is negative at this point. CT scan is pending looking for intra-abdominal abscess. Case is signed out to Dr. Rubin Payor to evaluate results of that test.  Dione Booze, MD 07/10/12 [phone]

## 2012-07-10 ENCOUNTER — Emergency Department (HOSPITAL_COMMUNITY): Payer: 59

## 2012-07-10 DIAGNOSIS — K651 Peritoneal abscess: Secondary | ICD-10-CM | POA: Insufficient documentation

## 2012-07-10 HISTORY — DX: Peritoneal abscess: K65.1

## 2012-07-10 LAB — COMPREHENSIVE METABOLIC PANEL
ALT: 29 U/L (ref 0–35)
AST: 11 U/L (ref 0–37)
Albumin: 2.6 g/dL — ABNORMAL LOW (ref 3.5–5.2)
CO2: 25 mEq/L (ref 19–32)
Calcium: 8 mg/dL — ABNORMAL LOW (ref 8.4–10.5)
Chloride: 108 mEq/L (ref 96–112)
Creatinine, Ser: 0.61 mg/dL (ref 0.50–1.10)
GFR calc non Af Amer: >90 mL/min (ref >90)
Sodium: 141 mEq/L (ref 135–145)
Total Bilirubin: 0.4 mg/dL (ref 0.3–1.2)

## 2012-07-10 LAB — CBC
HCT: 25 % — ABNORMAL LOW (ref 36.0–46.0)
Hemoglobin: 8.3 g/dL — ABNORMAL LOW (ref 12.0–15.0)
MCH: 28.5 pg (ref 26.0–34.0)
MCHC: 32.9 g/dL (ref 30.0–36.0)
MCHC: 33.6 g/dL (ref 30.0–36.0)
MCV: 86.6 fL (ref 78.0–100.0)
MCV: 86.2 fL (ref 78.0–100.0)
Platelets: 287 10*3/uL (ref 150–400)
RBC: 2.91 MIL/uL — ABNORMAL LOW (ref 3.87–5.11)
RDW: 14.2 % (ref 11.5–15.5)
WBC: 9.9 10*3/uL (ref 4.0–10.5)

## 2012-07-10 MED ORDER — CHLORHEXIDINE GLUCONATE 0.12 % MT SOLN
OROMUCOSAL | Status: AC
  Administered 2012-07-10: 15 mL
  Filled 2012-07-10: qty 15

## 2012-07-10 MED ORDER — CIPROFLOXACIN IN D5W 400 MG/200ML IV SOLN
400.0000 mg | Freq: Two times a day (BID) | INTRAVENOUS | Status: DC
  Administered 2012-07-10: 400 mg via INTRAVENOUS
  Filled 2012-07-10: qty 200

## 2012-07-10 MED ORDER — OXYCODONE HCL 5 MG PO TABS: 5.0000 mg | ORAL_TABLET | ORAL | Status: DC | PRN

## 2012-07-10 MED ORDER — SENNA 8.6 MG PO TABS: 1.0000 | ORAL_TABLET | Freq: Two times a day (BID) | ORAL | Status: DC

## 2012-07-10 MED ORDER — ONDANSETRON HCL 4 MG/2ML IJ SOLN
4.0000 mg | Freq: Once | INTRAMUSCULAR | Status: AC
  Administered 2012-07-10: 4 mg via INTRAVENOUS
  Filled 2012-07-10: qty 2

## 2012-07-10 MED ORDER — IOHEXOL 300 MG/ML  SOLN
100.0000 mL | Freq: Once | INTRAMUSCULAR | Status: AC | PRN
  Administered 2012-07-10: 100 mL via INTRAVENOUS

## 2012-07-10 MED ORDER — SODIUM CHLORIDE 0.9 % IV SOLN
INTRAVENOUS | Status: DC
  Administered 2012-07-10: 05:00:00 via INTRAVENOUS

## 2012-07-10 MED ORDER — ACETAMINOPHEN 650 MG RE SUPP
650.0000 mg | Freq: Four times a day (QID) | RECTAL | Status: DC | PRN
  Administered 2012-07-10: 650 mg via RECTAL
  Filled 2012-07-10: qty 1

## 2012-07-10 MED ORDER — HYDROMORPHONE HCL PF 1 MG/ML IJ SOLN
1.0000 mg | Freq: Once | INTRAMUSCULAR | Status: AC
  Administered 2012-07-10: 1 mg via INTRAVENOUS
  Filled 2012-07-10: qty 1

## 2012-07-10 MED ORDER — PIPERACILLIN-TAZOBACTAM 3.375 G IVPB
3.3750 g | Freq: Three times a day (TID) | INTRAVENOUS | Status: DC
  Administered 2012-07-10: 3.375 g via INTRAVENOUS
  Filled 2012-07-10 (×3): qty 50

## 2012-07-10 MED ORDER — ACETAMINOPHEN 325 MG PO TABS
650.0000 mg | ORAL_TABLET | Freq: Four times a day (QID) | ORAL | Status: DC | PRN
  Filled 2012-07-10: qty 2

## 2012-07-10 MED ORDER — METRONIDAZOLE IN NACL 5-0.79 MG/ML-% IV SOLN
500.0000 mg | Freq: Three times a day (TID) | INTRAVENOUS | Status: DC
  Filled 2012-07-10: qty 100

## 2012-07-10 MED ORDER — ONDANSETRON HCL 4 MG/2ML IJ SOLN
4.0000 mg | Freq: Four times a day (QID) | INTRAMUSCULAR | Status: DC | PRN
  Filled 2012-07-10: qty 2

## 2012-07-10 MED ORDER — ONDANSETRON HCL 4 MG/2ML IJ SOLN
4.0000 mg | Freq: Once | INTRAMUSCULAR | Status: AC
  Administered 2012-07-10: 4 mg via INTRAVENOUS

## 2012-07-10 MED ORDER — ONDANSETRON HCL 4 MG PO TABS: 4.0000 mg | ORAL_TABLET | Freq: Four times a day (QID) | ORAL | Status: DC | PRN

## 2012-07-10 MED ORDER — HYDROMORPHONE HCL PF 1 MG/ML IJ SOLN
1.0000 mg | INTRAMUSCULAR | Status: DC | PRN
  Administered 2012-07-10 (×2): 1 mg via INTRAVENOUS
  Filled 2012-07-10 (×2): qty 1

## 2012-07-10 MED ORDER — DOCUSATE SODIUM 100 MG PO CAPS: 100.0000 mg | ORAL_CAPSULE | Freq: Two times a day (BID) | ORAL | Status: DC

## 2012-07-10 NOTE — Progress Notes (Signed)
After speaking with our general surgery team and Dr. Windell Moulding, associate professor of transplant surgery at [facility], it is clear that the patient needs her care continued at [facility]. Therefore, she will be transferred to [facility]. The patient has been explained this plan and is in agreement.   Additionally, the patient had a cutaneous reaction to ciprofloxacin and it was d/c'd. We will start Zosyn instead.   Roslynn Amble, MD

## 2012-07-10 NOTE — H&P (Signed)
Family Medicine Teaching [facility] Admission History and Physical  Patient name: Katherine Levine Medical record number: [id number] Date of birth: May 30, 1981 Age: 31 y.o. Gender: female  Primary Care Provider: Ardyth Gal, MD  Chief Complaint: fever, abdominal pain and weakness History of Present Illness: Katherine Levine is a 31 y.o. year old female with a recent liver biopsy who is presenting with fever, abdominal pain and weakness in the setting a recent liver biopsy at [facility] on 06/22/12. The procedure was performed by Dr. Heidi Dach of the abdominal transplant team. The fever started on Monday, August 5th. Her temperature was 101.5, so she called [facility] and was told to take Tylenol 1g by a Consulting civil engineer. It has been persistent. The highest it has been is 102.2 on Thursday 07/08/12. Other symptoms include chills, pain in her right breast radiating to her right shoulder, sweating, and generalized weakness. She was presented to [facility] on 8/8 for , where she was evaluated for a post-operative follow up. No change in management was made after the follow-up. She was then seen in [facility] by Dr. Fara Boros on 07/09/12 who instructed her to come to the ED for further evaluation and imaging.   Patient Active Problem List  Diagnosis  . OBESITY, UNSPECIFIED  . ANXIETY STATE, UNSPECIFIED  . NONSPECIFIC ABN FINDING RAD & OTH EXAM GI TRACT  . Interstitial cystitis  . Pharyngitis  . Sinusitis  . Nodule on liver  . RUQ abdominal pain  . Fever   Past Medical History: Past Medical History  Diagnosis Date  . Kidney stone   . Obese   . Renal stone   . Nodule on liver     Since 2009 multinodular as seen on MRI seem to be a benign cause.    Past Surgical History: Past Surgical History  Procedure Date  . Cesarean section   . Tubal ligation   . Cholecystectomy     When she was 31    Social History: History   Social History  . Marital  Status: Married    Spouse Name: N/A    Number of Children: N/A  . Years of Education: N/A   Social History Main Topics  . Smoking status: Never Smoker   . Smokeless tobacco: None  . Alcohol Use: No  . Drug Use: No  . Sexually Active: Yes    Birth Control/ Protection: Surgical   Other Topics Concern  . None   Social History Narrative  . None    Family History: History reviewed. No pertinent family history.  Allergies: Allergies  Allergen Reactions  . Amoxicillin Itching and Nausea And Vomiting  . Azithromycin Itching and Nausea And Vomiting  . Fexofenadine Itching and Nausea And Vomiting  . Meperidine Hcl Itching    redness    Current Facility-Administered Medications  Medication Dose Route Frequency Provider Last Rate Last Dose  . 0.9 %  sodium chloride infusion   Intravenous Once Dione Booze, MD 125 mL/hr at 07/09/12 2354    . acetaminophen (TYLENOL) tablet 650 mg  650 mg Oral Once Dione Booze, MD   650 mg at 07/09/12 2118  . HYDROmorphone (DILAUDID) injection 1 mg  1 mg Intravenous Once Dione Booze, MD   1 mg at 07/09/12 2304  . HYDROmorphone (DILAUDID) injection 1 mg  1 mg Intravenous Once Nathan R. Pickering, MD   1 mg at 07/10/12 0313  . iohexol (OMNIPAQUE) 300 MG/ML solution 100 mL  100 mL Intravenous Once  PRN Medication Radiologist, MD   100 mL at 07/10/12 0219  . iohexol (OMNIPAQUE) 300 MG/ML solution 20 mL  20 mL Oral Q1 Hr x 2 Medication Radiologist, MD   20 mL at 07/09/12 2232  . ondansetron (ZOFRAN) injection 4 mg  4 mg Intravenous Once Dione Booze, MD   4 mg at 07/09/12 2309  . ondansetron (ZOFRAN) injection 4 mg  4 mg Intravenous Once [employer]. Pickering, MD   4 mg at 07/10/12 0309  . sodium chloride 0.9 % bolus 1,000 mL  1,000 mL Intravenous Once Dione Booze, MD   1,000 mL at 07/09/12 2257   Current Outpatient Prescriptions  Medication Sig Dispense Refill  . acetaminophen (TYLENOL) 500 MG tablet Take 1,000 mg by mouth every 6 (six) hours as needed. For  fever      . diphenhydrAMINE (BENADRYL) 25 MG tablet Take 25 mg by mouth at bedtime as needed. For allergies/sleep      . HYDROmorphone (DILAUDID) 2 MG tablet Take 2 mg by mouth every 6 (six) hours.      . traMADol (ULTRAM) 50 MG tablet Take 50 mg by mouth every 8 (eight) hours as needed. For pain      . tranexamic acid (LYSTEDA) 650 MG TABS Take 1,300 mg by mouth 3 (three) times daily as needed. Only takes when on menstrual period       Review Of Systems: Per HPI with the following additions: none Otherwise 12 point review of systems was performed and was unremarkable.  Physical Exam: BP 122/76  Pulse 93  Temp 97.7 F (36.5 C) (Oral)  Resp 18  SpO2 99%  LMP 06/27/2012 General: alert and cooperative, ill appearing, pale  HEENT: PERRLA, extra ocular movement intact, sclera clear, anicteric and oropharynx clear, no lesions Heart: S1, S2 normal, no murmur, rub or gallop, regular rate and rhythm Lungs: clear to auscultation, no wheezes or rales and unlabored breathing Abdomen: well healing midline incision, moderate epigatric fullness with tenderness to palpation, no guarding or rigidity   Extremities: extremities normal, atraumatic, no cyanosis or edema Skin:pallor of her palms  Neurology: normal without focal findings, mental status, speech normal, alert and oriented x3, PERLA and reflexes normal and symmetric  Labs and Imaging:  Results for orders placed during the hospital encounter of 07/09/12 (from the past 24 hour(s))  CBC WITH DIFFERENTIAL     Status: Abnormal   Collection Time   07/09/12  4:03 PM      Component Value Range   WBC 11.6 (*) 4.0 - 10.5 K/uL   RBC 3.34 (*) 3.87 - 5.11 MIL/uL   Hemoglobin 9.7 (*) 12.0 - 15.0 g/dL   HCT 08.6 (*) 57.8 - 46.9 %   MCV 86.2  78.0 - 100.0 fL   MCH 29.0  26.0 - 34.0 pg   MCHC 33.7  30.0 - 36.0 g/dL   RDW 62.9  52.8 - 41.3 %   Platelets 371  150 - 400 K/uL   Neutrophils Relative 72  43 - 77 %   Neutro Abs 8.4 (*) 1.7 - 7.7 K/uL    Lymphocytes Relative 20  12 - 46 %   Lymphs Abs 2.4  0.7 - 4.0 K/uL   Monocytes Relative 6  3 - 12 %   Monocytes Absolute 0.7  0.1 - 1.0 K/uL   Eosinophils Relative 1  0 - 5 %   Eosinophils Absolute 0.1  0.0 - 0.7 K/uL   Basophils Relative 0  0 - 1 %  Basophils Absolute 0.0  0.0 - 0.1 K/uL  COMPREHENSIVE METABOLIC PANEL     Status: Abnormal   Collection Time   07/09/12  4:03 PM      Component Value Range   Sodium 137  135 - 145 mEq/L   Potassium 4.8  3.5 - 5.1 mEq/L   Chloride 101  96 - 112 mEq/L   CO2 26  19 - 32 mEq/L   Glucose, Bld 93  70 - 99 mg/dL   BUN 6  6 - 23 mg/dL   Creatinine, Ser 1.61  0.50 - 1.10 mg/dL   Calcium 9.2  8.4 - 09.6 mg/dL   Total Protein 7.2  6.0 - 8.3 g/dL   Albumin 3.4 (*) 3.5 - 5.2 g/dL   AST 22  0 - 37 U/L   ALT 41 (*) 0 - 35 U/L   Alkaline Phosphatase 120 (*) 39 - 117 U/L   Total Bilirubin 0.4  0.3 - 1.2 mg/dL   GFR calc non Af Amer >90  >90 mL/min   GFR calc Af Amer >90  >90 mL/min  PHOSPHORUS     Status: Normal   Collection Time   07/09/12  4:03 PM      Component Value Range   Phosphorus 3.6  2.3 - 4.6 mg/dL  URINALYSIS, ROUTINE W REFLEX MICROSCOPIC     Status: Normal   Collection Time   07/09/12  4:18 PM      Component Value Range   Color, Urine YELLOW  YELLOW   APPearance CLEAR  CLEAR   Specific Gravity, Urine 1.009  1.005 - 1.030   pH 8.0  5.0 - 8.0   Glucose, UA NEGATIVE  NEGATIVE mg/dL   Hgb urine dipstick NEGATIVE  NEGATIVE   Bilirubin Urine NEGATIVE  NEGATIVE   Ketones, ur NEGATIVE  NEGATIVE mg/dL   Protein, ur NEGATIVE  NEGATIVE mg/dL   Urobilinogen, UA 0.2  0.0 - 1.0 mg/dL   Nitrite NEGATIVE  NEGATIVE   Leukocytes, UA NEGATIVE  NEGATIVE  POCT PREGNANCY, URINE     Status: Normal   Collection Time   07/09/12  4:42 PM      Component Value Range   Preg Test, Ur NEGATIVE  NEGATIVE    CT Scan Abdomen and Pelvis  IMPRESSION:  Postoperative changes of the left hepatic lobe. There is a 6.7 x  5.1 x 2.2 cm ill-defined lobulated fluid  collection which may  reflect a postoperative hematoma or less likely biloma; sterility  is indeterminate by CT. No active extravasation is visualized.  Original Report Authenticated By: Waneta Martins, M.D.   Assessment and Plan: Katherine Levine is a 31 y.o. year old female  Who is 2 weeks post left hepatectomy presenting with persistent fevers and findings of a lobulated fluid collection on her liver.   # Fever and abdominal pain - This is concerning for an abscess versus a hematoma versus biloma. We will consult general surgery to see if intervention is warranted. We will also contact Dr. Roel Cluck team at [facility].  Additionally, we will start IV antibiotics, Cipro and flagy, to cover most intra-abdominal pathogen. Dilaudid PRN for pain.    # Anemia - Most likely secondary to bleeding of the liver. Continue to follow and transfuse if < 7.0.     # FENGI - NPO in preparation for possible surgery; NS @ 125 ml/hr    # PPX - No DVT PPX given possible active bleed  # Dispo - Admit to [facility]  Medicine Teaching Service.

## 2012-07-10 NOTE — ED Notes (Signed)
Patient currently asleep in bed; no respiratory or acute distress noted.  CT called and stated that they will be by to transport patient to CT (patient finished drinking CT contrast/water cups).  Will continue to monitor.

## 2012-07-10 NOTE — H&P (Signed)
I discussed the care plan with Dr. Clinton Sawyer and the [facility] team and agree with assessment and plan as documented in the admission note for today.    Ammiel Guiney A. Sheffield Slider, MD Family Medicine Teaching Service Attending  07/10/2012 5:08 PM

## 2012-07-10 NOTE — Progress Notes (Signed)
Went to check in on patient.  COBRA form for transfer to [facility] signed.  Patient appreciative of our efforts to provide excellent care.  Apologized for the delay in ED last night while waiting on the CT abd.  Patient aware of transfer to [facility] and understanding of why this should occur.  Currently appears more comfortable than in clinic yesterday.  Afebrile, asking for more than ice chips since she is thirsty.  Explained she is getting IVF so she is not dehydrated but that we cannot allow her to eat or drink in case [facility] needs to do a procedure once she is there.  She is understanding.  Will continue to monitor pt while she is here at [facility].  Manolito Jurewicz 07/10/2012, 9:22 AM

## 2012-07-10 NOTE — ED Notes (Signed)
Report given to [employer], [facility].

## 2012-07-10 NOTE — ED Notes (Signed)
Patient currently sitting up in bed; no respiratory or acute distress noted.  Patient requesting more pain medication; informed patient that the EDP is currently in with a trauma and that we will ask him as soon as he is finished; patient has no other questions or concerns at this time; will continue to monitor.

## 2012-07-13 DIAGNOSIS — K7689 Other specified diseases of liver: Secondary | ICD-10-CM

## 2012-07-13 DIAGNOSIS — T7840XA Allergy, unspecified, initial encounter: Secondary | ICD-10-CM

## 2012-07-13 HISTORY — DX: Other specified diseases of liver: K76.89

## 2012-07-13 HISTORY — DX: Allergy, unspecified, initial encounter: T78.40XA

## 2012-07-13 NOTE — Discharge Summary (Signed)
Physician Discharge Summary  Patient ID: Katherine Levine MRN: [id number] DOB/AGE: 1981/07/27 31 y.o.  Admit date: 07/09/2012 Discharge date: 07/13/2012  Admission Diagnoses: Abdominal Pain and Fever  Discharge Diagnoses:  Principal Problem:  *RUQ abdominal pain Active Problems:  Fever  Allergic reaction to drug  Hepatic cyst   Discharged Condition: fair  Hospital Course: 31 year old F who underwent a recent left hepatectomy at [facility] on 06/22/12. She presented with fever and abdominal pain. An abdominal CT demonstrated a a 5 cm x 7 cm x 2 cm fluid collection on the left margin of the liver that could represent a hematoma or biloma. The patient was admitted and started on IV ciprofloxacin in case an intra-adominal infection was present. She developed a cutaneous reaction in the arm with the IV receiving the antibiotic. The medication was stopped and the problem quickly resolved. Therefore, she was changed to IV Zosyn for coverage of a potential intra-abdominal infection.  After speaking with the on-call surgeon, he recommended that we contact her surgeon, Dr. Heidi Dach, at [facility]. Dr. Roel Cluck partner, Dr. Windell Moulding, agreed to take over care of the patient at [facility]. Therefore, she was transferred to the [facility] abdominal transplant surgery service for further care.    Consults: general surgery  Significant Diagnostic Studies:   CT Abdomen 07/09/12: IMPRESSION:  Postoperative changes of the left hepatic lobe. There is a 6.7 x  5.1 x 2.2 cm ill-defined lobulated fluid collection which may  reflect a postoperative hematoma or less likely biloma; sterility  is indeterminate by CT. No active extravasation is visualized   CBC    Component Value Date/Time   WBC 9.9 07/10/2012 0819   RBC 2.90* 07/10/2012 0819   HGB 8.4* 07/10/2012 0819   HCT 25.0* 07/10/2012 0819   PLT 287 07/10/2012 0819   MCV 86.2 07/10/2012 0819   MCH 29.0 07/10/2012 0819   MCHC 33.6 07/10/2012 0819   RDW  14.2 07/10/2012 0819   LYMPHSABS 2.4 07/09/2012 1603   MONOABS 0.7 07/09/2012 1603   EOSABS 0.1 07/09/2012 1603   BASOSABS 0.0 07/09/2012 1603      Treatments: IV hydration, antibiotics: Zosyn and Cipro and analgesia: Dilaudid  Discharge Exam: Blood pressure 108/63, pulse 105, temperature 101.8 F (38.8 C), temperature source Axillary, resp. rate 18, height 5\' 6"  (1.676 m), weight 184 lb (83.462 kg), last menstrual period 06/27/2012, SpO2 98.00%. General: alert and cooperative, ill appearing, pale  HEENT: PERRLA, extra ocular movement intact, sclera clear, anicteric and oropharynx clear, no lesions  Heart: S1, S2 normal, no murmur, rub or gallop, regular rate and rhythm  Lungs: clear to auscultation, no wheezes or rales and unlabored breathing  Abdomen: well healing midline incision, moderate epigatric fullness with tenderness to palpation, no guarding or rigidity  Extremities: extremities normal, atraumatic, no cyanosis or edema  Skin:pallor of her palms  Neurology: normal without focal findings, mental status, speech normal, alert and oriented x3, PERLA and reflexes normal and symmetric   Disposition: [facility] Not Defined  Discharge Orders    Future Orders Please Complete By Expires   Discharge patient      Comments:   Discharge to [facility] who will transfer to [facility] hospital. Please make sure the patient CT scan is transported with her. Thank you.     Medication List  As of 07/13/2012  1:52 PM   STOP taking these medications         HYDROmorphone 2 MG tablet  TAKE these medications         acetaminophen 500 MG tablet   Commonly known as: TYLENOL   Take 1,000 mg by mouth every 6 (six) hours as needed. For fever      diphenhydrAMINE 25 MG tablet   Commonly known as: BENADRYL   Take 25 mg by mouth at bedtime as needed. For allergies/sleep      LYSTEDA 650 MG Tabs   Generic drug: tranexamic acid   Take 1,300 mg by mouth 3 (three) times daily as  needed. Only takes when on menstrual period      traMADol 50 MG tablet   Commonly known as: ULTRAM   Take 50 mg by mouth every 8 (eight) hours as needed. For pain             Signed: Mat Carne 07/13/2012, 1:52 PM

## 2012-07-13 NOTE — Discharge Summary (Signed)
I discussed the care plan with Dr. Ree Edman and the [facility] team and agree with assessment and plan as documented in the admission note. She was transferred to [facility] before I was able to see her.     Elza Varricchio A. Sheffield Slider, MD Family Medicine Teaching Service Attending  07/13/2012 6:04 PM

## 2012-08-01 ENCOUNTER — Ambulatory Visit (INDEPENDENT_AMBULATORY_CARE_PROVIDER_SITE_OTHER): Payer: 59 | Admitting: Emergency Medicine

## 2012-08-01 VITALS — BP 124/80 | HR 88 | Temp 98.9°F | Resp 18 | Ht 64.25 in | Wt 183.2 lb

## 2012-08-01 DIAGNOSIS — J029 Acute pharyngitis, unspecified: Secondary | ICD-10-CM

## 2012-08-01 DIAGNOSIS — J302 Other seasonal allergic rhinitis: Secondary | ICD-10-CM

## 2012-08-01 DIAGNOSIS — J309 Allergic rhinitis, unspecified: Secondary | ICD-10-CM

## 2012-08-01 MED ORDER — PSEUDOEPHEDRINE-GUAIFENESIN ER 60-600 MG PO TB12: 1.0000 | ORAL_TABLET | Freq: Two times a day (BID) | ORAL | Status: DC

## 2012-08-01 NOTE — Progress Notes (Signed)
Date:  08/01/2012   Name:  Katherine Levine   DOB:  [birth year]   MRN:  [id number] Gender: female Age: 31 y.o.  PCP:  Ardyth Gal, MD    Chief Complaint: Sore Throat, Otalgia, Nasal Congestion and Fatigue   History of Present Illness:  Katherine Levine is a 31 y.o. pleasant patient who presents with the following:  Underwent a liver resection in July complicated by a major bleed and a hepatic abscess.  Still on antibiotics from surgery and drainage (levaquin and clindamycin).  Now has sinus congestion and nasal congestion.  Pain and pressure in both ears.  Clear nasal drainage with post nasal drainage and a non productive cough. Has a sore throat.  Has malaise and says that she feels "hot" but has no fever or chills.  No nausea or vomiting, shortness of breath or wheezing.  Patient Active Problem List  Diagnosis  . OBESITY, UNSPECIFIED  . ANXIETY STATE, UNSPECIFIED  . NONSPECIFIC ABN FINDING RAD & OTH EXAM GI TRACT  . Interstitial cystitis  . Pharyngitis  . Sinusitis  . Nodule on liver  . RUQ abdominal pain  . Fever  . Allergic reaction to drug  . Hepatic cyst    Past Medical History  Diagnosis Date  . Kidney stone   . Obese   . Renal stone   . Nodule on liver     Since 2009 multinodular as seen on MRI seem to be a benign cause.    Past Surgical History  Procedure Date  . Cesarean section   . Tubal ligation   . Cholecystectomy     When she was 14    History  Substance Use Topics  . Smoking status: Never Smoker   . Smokeless tobacco: Not on file  . Alcohol Use: No    No family history on file.  Allergies  Allergen Reactions  . Ciprofloxacin Rash    Red warm rash on arm of with IV running Cipro  . Fexofenadine Itching and Nausea And Vomiting  . Meperidine Hcl Itching    redness  . Amoxicillin Itching and Nausea And Vomiting  . Azithromycin Itching and Nausea And Vomiting    Medication list has been reviewed and updated.  Current  Outpatient Prescriptions on File Prior to Visit  Medication Sig Dispense Refill  . ferrous sulfate 325 (65 FE) MG tablet Take 325 mg by mouth 2 (two) times daily.      . tranexamic acid (LYSTEDA) 650 MG TABS Take 1,300 mg by mouth 3 (three) times daily as needed. Only takes when on menstrual period        Review of Systems:  As per HPI, otherwise negative.    Physical Examination: Filed Vitals:   08/01/12 1620  BP: 124/80  Pulse: 88  Temp: 98.9 F (37.2 C)  Resp: 18   Filed Vitals:   08/01/12 1620  Height: 5' 4.25" (1.632 m)  Weight: 183 lb 3.2 oz (83.099 kg)   Body mass index is 31.20 kg/(m^2). Ideal Body Weight: Weight in (lb) to have BMI = 25: 146.5    GEN: WDWN, NAD, Non-toxic, Alert & Oriented x 3.  No sepsis or icterus. HEENT: Atraumatic, Normocephalic. TM negative.  Oropharynx negative Ears and Nose: No external deformity.  Nasal congestion and clear drainage.   CHEST:  CTA, BS= EXTR: No clubbing/cyanosis/edema NEURO: Normal gait.  PSYCH: Normally interactive. Conversant. Not depressed or anxious appearing.  Calm demeanor.    Assessment and Plan: Seasonal allergic rhinitis. Mucinex  D Continue antibiotics Follow up as needle  Carmelina Dane, MD

## 2012-09-01 ENCOUNTER — Encounter: Payer: Self-pay | Admitting: Family Medicine

## 2012-09-01 ENCOUNTER — Ambulatory Visit (INDEPENDENT_AMBULATORY_CARE_PROVIDER_SITE_OTHER): Payer: 59 | Admitting: Family Medicine

## 2012-09-01 VITALS — BP 132/82 | HR 90 | Temp 97.7°F | Ht 64.25 in | Wt 188.0 lb

## 2012-09-01 DIAGNOSIS — N938 Other specified abnormal uterine and vaginal bleeding: Secondary | ICD-10-CM

## 2012-09-01 DIAGNOSIS — N949 Unspecified condition associated with female genital organs and menstrual cycle: Secondary | ICD-10-CM

## 2012-09-01 DIAGNOSIS — R5381 Other malaise: Secondary | ICD-10-CM

## 2012-09-01 DIAGNOSIS — R5383 Other fatigue: Secondary | ICD-10-CM

## 2012-09-01 DIAGNOSIS — K7689 Other specified diseases of liver: Secondary | ICD-10-CM

## 2012-09-01 HISTORY — DX: Other fatigue: R53.83

## 2012-09-01 HISTORY — DX: Other specified abnormal uterine and vaginal bleeding: N93.8

## 2012-09-01 LAB — CBC
HCT: 42 % (ref 36.0–46.0)
Platelets: 178 10*3/uL (ref 150–400)
RBC: 4.73 MIL/uL (ref 3.87–5.11)
RDW: 15.6 % — ABNORMAL HIGH (ref 11.5–15.5)
WBC: 7.6 10*3/uL (ref 4.0–10.5)

## 2012-09-01 LAB — COMPREHENSIVE METABOLIC PANEL
ALT: 10 U/L (ref 0–35)
AST: 12 U/L (ref 0–37)
Albumin: 4.3 g/dL (ref 3.5–5.2)
BUN: 13 mg/dL (ref 6–23)
CO2: 22 mEq/L (ref 19–32)
Calcium: 9.4 mg/dL (ref 8.4–10.5)
Chloride: 109 mEq/L (ref 96–112)
Potassium: 4 mEq/L (ref 3.5–5.3)

## 2012-09-01 LAB — TSH: TSH: 1.01 u[IU]/mL (ref 0.350–4.500)

## 2012-09-01 MED ORDER — BENZONATATE 100 MG PO CAPS: 100.0000 mg | ORAL_CAPSULE | Freq: Two times a day (BID) | ORAL | Status: DC | PRN

## 2012-09-01 MED ORDER — TRANEXAMIC ACID 650 MG PO TABS: 1300.0000 mg | ORAL_TABLET | Freq: Three times a day (TID) | ORAL | Status: DC | PRN

## 2012-09-01 NOTE — Assessment & Plan Note (Signed)
Refill  Lysteda. Refer to [facility]BG[facility] within [facility] to look into endometrial ablation.

## 2012-09-01 NOTE — Assessment & Plan Note (Signed)
Will check LFT's to make sure no hepatic problems contributing to fatigue.

## 2012-09-01 NOTE — Assessment & Plan Note (Signed)
Discussed with patient that she had a major surgery that may take months to feel normal again.  However, will check some labs to make sure LFT's blood counts, electrolytes are normal.

## 2012-09-01 NOTE — Patient Instructions (Signed)
It was nice to meet you.  Our office will contact you with your OBGYN appointment.  I am checking some labs to see if there is anything contributing to your fatigue, but I think you need to continue to be patient for your body to recover.

## 2012-09-01 NOTE — Progress Notes (Signed)
  Subjective:    Patient ID: Katherine Levine, female    DOB: [birth year], 31 y.o.   MRN: [id number]  HPI  Katherine Levine comes in for her first visit at [facility] since having liver surgery at the end of July.  She says she is generally feeling bad, fatigued, tired all the time.  She says she is an active person and does not think she should need naps in the middle of the day.  She denies fevers/chills, but has had a cough for a couple of days, that she feels is separate from the fatigue.  She last had her labs checked at [facility] about 4 weeks ago.    She also complains of heavy menstrual bleeding, that has been getting worse.  She takes Lysteda to help, but it is not as effective as it used to be. She has an OBGYN in [place], but getting an Ablation was going to cost $3000 there out of pocket.  She is hoping I can refer her to a doctor in-network so it wont' cost so much.    Review of Systems See HPI    Objective:   Physical Exam  BP 132/82  Pulse 90  Temp 97.7 F (36.5 C) (Oral)  Ht 5' 4.25" (1.632 m)  Wt 188 lb (85.276 kg)  BMI 32.02 kg/m2  LMP 08/30/2012 General appearance: alert, cooperative and no distress Lungs: clear to auscultation bilaterally Heart: regular rate and rhythm, S1, S2 normal, no murmur, click, rub or gallop Abdomen: soft, non-tender; bowel sounds normal; no masses,  no organomegaly Extremities: extremities normal, atraumatic, no cyanosis or edema Pulses: 2+ and symmetric      Assessment & Plan:

## 2012-09-06 ENCOUNTER — Encounter: Payer: Self-pay | Admitting: Family Medicine

## 2012-09-25 ENCOUNTER — Encounter (HOSPITAL_COMMUNITY): Payer: Self-pay | Admitting: Pharmacist

## 2012-10-15 ENCOUNTER — Other Ambulatory Visit: Payer: Self-pay | Admitting: Obstetrics & Gynecology

## 2012-10-18 ENCOUNTER — Encounter (HOSPITAL_COMMUNITY)
Admission: RE | Admit: 2012-10-18 | Discharge: 2012-10-18 | Disposition: A | Discharge status: Home / Self Care | Payer: 59 | Source: Ambulatory Visit | Attending: Obstetrics & Gynecology | Admitting: Obstetrics & Gynecology

## 2012-10-18 ENCOUNTER — Encounter (HOSPITAL_COMMUNITY): Payer: Self-pay

## 2012-10-18 LAB — PROTIME-INR
INR: 0.92 (ref 0.00–1.49)
Prothrombin Time: 12.3 seconds (ref 11.6–15.2)

## 2012-10-18 LAB — CBC
MCHC: 33.8 g/dL (ref 30.0–36.0)
RDW: 14 % (ref 11.5–15.5)

## 2012-10-18 MED ORDER — METRONIDAZOLE IN NACL 5-0.79 MG/ML-% IV SOLN
500.0000 mg | INTRAVENOUS | Status: AC
  Administered 2012-10-19: 500 mg via INTRAVENOUS
  Filled 2012-10-18: qty 100

## 2012-10-18 NOTE — Patient Instructions (Addendum)
20 WILEY SEELINGER  10/18/2012   Your procedure is scheduled on:  10/19/12  Enter through the Main Entrance of [facility] at 1030 AM.  Pick up the phone at the desk and dial [phone].   Call this number if you have problems the morning of surgery: [phone]   Remember:   Do not eat food:After Midnight.  Do not drink clear liquids: After Midnight.  Take these medicines the morning of surgery with A SIP OF WATER: NA   Do not wear jewelry, make-up or nail polish.  Do not wear lotions, powders, or perfumes. You may wear deodorant.  Do not shave 48 hours prior to surgery.  Do not bring valuables to the hospital.  Contacts, dentures or bridgework may not be worn into surgery.  Leave suitcase in the car. After surgery it may be brought to your room.  For patients admitted to the hospital, checkout time is 11:00 AM the day of discharge.   Patients discharged the day of surgery will not be allowed to drive home.  Name and phone number of your driver: Mother Albertina Parr  Special Instructions: Shower using CHG 2 nights before surgery and the night before surgery.  If you shower the day of surgery use CHG.  Use special wash - you have one bottle of CHG for all showers.  You should use approximately 1/3 of the bottle for each shower.   Please read over the following fact sheets that you were given: Surgical Site Infection Prevention

## 2012-10-19 ENCOUNTER — Ambulatory Visit (HOSPITAL_COMMUNITY)
Admission: RE | Admit: 2012-10-19 | Discharge: 2012-10-19 | Disposition: A | Discharge status: Home / Self Care | Payer: 59 | Source: Ambulatory Visit | Attending: Obstetrics & Gynecology | Admitting: Obstetrics & Gynecology

## 2012-10-19 ENCOUNTER — Encounter (HOSPITAL_COMMUNITY): Payer: Self-pay | Admitting: Anesthesiology

## 2012-10-19 ENCOUNTER — Other Ambulatory Visit: Payer: Self-pay | Admitting: Family Medicine

## 2012-10-19 ENCOUNTER — Encounter (HOSPITAL_COMMUNITY)
Admission: RE | Disposition: A | Discharge status: Home / Self Care | Payer: Self-pay | Source: Ambulatory Visit | Attending: Obstetrics & Gynecology

## 2012-10-19 ENCOUNTER — Ambulatory Visit (HOSPITAL_COMMUNITY): Payer: 59 | Admitting: Anesthesiology

## 2012-10-19 ENCOUNTER — Encounter (HOSPITAL_COMMUNITY): Payer: Self-pay | Admitting: *Deleted

## 2012-10-19 DIAGNOSIS — Z01812 Encounter for preprocedural laboratory examination: Secondary | ICD-10-CM | POA: Insufficient documentation

## 2012-10-19 DIAGNOSIS — N920 Excessive and frequent menstruation with regular cycle: Secondary | ICD-10-CM | POA: Insufficient documentation

## 2012-10-19 DIAGNOSIS — Z01818 Encounter for other preprocedural examination: Secondary | ICD-10-CM | POA: Insufficient documentation

## 2012-10-19 HISTORY — PX: DILITATION & CURRETTAGE/HYSTROSCOPY WITH NOVASURE ABLATION: SHX5568

## 2012-10-19 SURGERY — DILATATION & CURETTAGE/HYSTEROSCOPY WITH NOVASURE ABLATION
Anesthesia: General | Site: Uterus | Wound class: Clean Contaminated

## 2012-10-19 MED ORDER — CHLOROPROCAINE HCL 1 % IJ SOLN
INTRAMUSCULAR | Status: DC | PRN
  Administered 2012-10-19: 30 mL

## 2012-10-19 MED ORDER — MIDAZOLAM HCL 5 MG/5ML IJ SOLN
INTRAMUSCULAR | Status: DC | PRN
  Administered 2012-10-19: 2 mg via INTRAVENOUS

## 2012-10-19 MED ORDER — FENTANYL CITRATE 0.05 MG/ML IJ SOLN
INTRAMUSCULAR | Status: AC
  Filled 2012-10-19: qty 2

## 2012-10-19 MED ORDER — HYDROCODONE-ACETAMINOPHEN 5-500 MG PO TABS: 1.0000 | ORAL_TABLET | Freq: Four times a day (QID) | ORAL | Status: DC | PRN

## 2012-10-19 MED ORDER — DEXAMETHASONE SODIUM PHOSPHATE 4 MG/ML IJ SOLN
INTRAMUSCULAR | Status: DC | PRN
  Administered 2012-10-19: 10 mg via INTRAVENOUS

## 2012-10-19 MED ORDER — LACTATED RINGERS IR SOLN
Status: DC | PRN
  Administered 2012-10-19: 3000 mL

## 2012-10-19 MED ORDER — PROPOFOL 10 MG/ML IV EMUL
INTRAVENOUS | Status: DC | PRN
  Administered 2012-10-19: 200 mg via INTRAVENOUS

## 2012-10-19 MED ORDER — FENTANYL CITRATE 0.05 MG/ML IJ SOLN
INTRAMUSCULAR | Status: DC | PRN
  Administered 2012-10-19: 100 ug via INTRAVENOUS

## 2012-10-19 MED ORDER — ONDANSETRON HCL 4 MG/2ML IJ SOLN: 4.0000 mg | Freq: Once | INTRAMUSCULAR | Status: DC | PRN

## 2012-10-19 MED ORDER — PROPOFOL 10 MG/ML IV EMUL
INTRAVENOUS | Status: AC
  Filled 2012-10-19: qty 20

## 2012-10-19 MED ORDER — ONDANSETRON HCL 4 MG/2ML IJ SOLN
INTRAMUSCULAR | Status: AC
  Filled 2012-10-19: qty 2

## 2012-10-19 MED ORDER — ONDANSETRON HCL 4 MG/2ML IJ SOLN
INTRAMUSCULAR | Status: DC | PRN
  Administered 2012-10-19: 4 mg via INTRAVENOUS

## 2012-10-19 MED ORDER — MIDAZOLAM HCL 2 MG/2ML IJ SOLN
INTRAMUSCULAR | Status: AC
  Filled 2012-10-19: qty 2

## 2012-10-19 MED ORDER — DEXAMETHASONE SODIUM PHOSPHATE 10 MG/ML IJ SOLN
INTRAMUSCULAR | Status: AC
  Filled 2012-10-19: qty 1

## 2012-10-19 MED ORDER — MORPHINE SULFATE 4 MG/ML IJ SOLN
INTRAMUSCULAR | Status: AC
  Administered 2012-10-19: 2 mg via INTRAVENOUS
  Filled 2012-10-19: qty 1

## 2012-10-19 MED ORDER — LIDOCAINE HCL (CARDIAC) 20 MG/ML IV SOLN
INTRAVENOUS | Status: DC | PRN
  Administered 2012-10-19: 80 mg via INTRAVENOUS

## 2012-10-19 MED ORDER — KETOROLAC TROMETHAMINE 30 MG/ML IJ SOLN: 15.0000 mg | Freq: Once | INTRAMUSCULAR | Status: DC | PRN

## 2012-10-19 MED ORDER — KETOROLAC TROMETHAMINE 30 MG/ML IJ SOLN
INTRAMUSCULAR | Status: DC | PRN
  Administered 2012-10-19: 30 mg via INTRAVENOUS

## 2012-10-19 MED ORDER — MORPHINE SULFATE 4 MG/ML IJ SOLN
1.0000 mg | INTRAMUSCULAR | Status: DC | PRN
  Administered 2012-10-19: 2 mg via INTRAVENOUS

## 2012-10-19 MED ORDER — LACTATED RINGERS IV SOLN
INTRAVENOUS | Status: DC
  Administered 2012-10-19 (×2): via INTRAVENOUS

## 2012-10-19 MED ORDER — LIDOCAINE HCL (CARDIAC) 20 MG/ML IV SOLN
INTRAVENOUS | Status: AC
  Filled 2012-10-19: qty 5

## 2012-10-19 MED ORDER — CHLOROPROCAINE HCL 1 % IJ SOLN
INTRAMUSCULAR | Status: AC
  Filled 2012-10-19: qty 30

## 2012-10-19 SURGICAL SUPPLY — 13 items
ABLATOR ENDOMETRIAL BIPOLAR (ABLATOR) ×2 IMPLANT
CATH ROBINSON RED A/P 16FR (CATHETERS) ×2 IMPLANT
CLOTH BEACON ORANGE TIMEOUT ST (SAFETY) ×2 IMPLANT
CONTAINER PREFILL 10% NBF 60ML (FORM) IMPLANT
DRESSING TELFA 8X3 (GAUZE/BANDAGES/DRESSINGS) ×2 IMPLANT
GLOVE BIO SURGEON STRL SZ 6.5 (GLOVE) ×4 IMPLANT
GLOVE BIOGEL PI IND STRL 7.0 (GLOVE) ×1 IMPLANT
GLOVE BIOGEL PI INDICATOR 7.0 (GLOVE) ×1
GOWN STRL REIN XL XLG (GOWN DISPOSABLE) ×6 IMPLANT
PACK HYSTEROSCOPY LF (CUSTOM PROCEDURE TRAY) ×2 IMPLANT
PAD OB MATERNITY 4.3X12.25 (PERSONAL CARE ITEMS) ×2 IMPLANT
TOWEL OR 17X24 6PK STRL BLUE (TOWEL DISPOSABLE) ×4 IMPLANT
WATER STERILE IRR 1000ML POUR (IV SOLUTION) ×2 IMPLANT

## 2012-10-19 NOTE — Discharge Summary (Signed)
  Physician Discharge Summary  Patient ID: Katherine Levine MRN: [id number] DOB/AGE: September 20, 1981 31 y.o.  Admit date: 10/19/2012 Discharge date: 10/19/2012  Admission Diagnoses: Menorrhagia  Discharge Diagnoses: Menorrhagia        Active Problems:  * No active hospital problems. *    Discharged Condition: good  Hospital Course: Outpatient  Consults: None  Treatments: surgery: HSC, D+C, Novasure Endometrial Ablation  Disposition: 02-Short Term Hospital     Medication List     As of 10/19/2012 12:57 PM    STOP taking these medications         tranexamic acid 650 MG Tabs   Commonly known as: LYSTEDA      TAKE these medications         ferrous sulfate 325 (65 FE) MG tablet   Take 325 mg by mouth daily with breakfast.      HYDROcodone-acetaminophen 5-500 MG per tablet   Commonly known as: VICODIN   Take 1 tablet by mouth every 6 (six) hours as needed for pain.      multivitamin with minerals tablet   Take 1 tablet by mouth daily.           Follow-up Information    Follow up with Delmar Arriaga,MARIE-LYNE, MD. In 3 weeks.   Contact information:   [address] [place] [address] [phone]          Signed: Genia Del, MD 10/19/2012, 12:57 PM

## 2012-10-19 NOTE — Anesthesia Preprocedure Evaluation (Signed)
Anesthesia Evaluation  Patient identified by MRN, date of birth, ID band Patient awake    Reviewed: Allergy & Precautions, H&P , NPO status , Patient's Chart, lab work & pertinent test results  Airway Mallampati: II TM Distance: >3 FB Neck ROM: full    Dental No notable dental hx. (+) Teeth Intact   Pulmonary neg pulmonary ROS,    Pulmonary exam normal       Cardiovascular negative cardio ROS      Neuro/Psych negative neurological ROS  negative psych ROS   GI/Hepatic negative GI ROS, Neg liver ROS,   Endo/Other  negative endocrine ROS  Renal/GU   negative genitourinary   Musculoskeletal negative musculoskeletal ROS (+)   Abdominal Normal abdominal exam  (+)   Peds negative pediatric ROS (+)  Hematology negative hematology ROS (+)   Anesthesia Other Findings   Reproductive/Obstetrics negative OB ROS                           Anesthesia Physical Anesthesia Plan  ASA: II  Anesthesia Plan: General   Post-op Pain Management:    Induction: Intravenous  Airway Management Planned: LMA  Additional Equipment:   Intra-op Plan:   Post-operative Plan:   Informed Consent: I have reviewed the patients History and Physical, chart, labs and discussed the procedure including the risks, benefits and alternatives for the proposed anesthesia with the patient or authorized representative who has indicated his/her understanding and acceptance.     Plan Discussed with: CRNA and Surgeon  Anesthesia Plan Comments:         Anesthesia Quick Evaluation

## 2012-10-19 NOTE — Anesthesia Procedure Notes (Signed)
Procedure Name: LMA Insertion Date/Time: 10/19/2012 12:19 PM Performed by: Graciela Husbands Pre-anesthesia Checklist: Patient being monitored, Suction available, Emergency Drugs available, Patient identified and Timeout performed Patient Re-evaluated:Patient Re-evaluated prior to inductionOxygen Delivery Method: Circle system utilized Preoxygenation: Pre-oxygenation with 100% oxygen Intubation Type: IV induction LMA: LMA inserted LMA Size: 4.0 Number of attempts: 1 Placement Confirmation: breath sounds checked- equal and bilateral and positive ETCO2 Tube secured with: Tape Dental Injury: Teeth and Oropharynx as per pre-operative assessment

## 2012-10-19 NOTE — Op Note (Signed)
10/19/2012  12:45 PM  PATIENT:  Katherine Levine  31 y.o. female  PRE-OPERATIVE DIAGNOSIS:  Menorrhagia  POST-OPERATIVE DIAGNOSIS:  Menorrhagia  PROCEDURE:  Procedure(s): DILATATION & CURETTAGE/HYSTEROSCOPY WITH NOVASURE ABLATION  SURGEON:  Surgeon(s): Genia Del, MD  ASSISTANTS: none   ANESTHESIA:   general  PROCEDURE:  Under general anesthesia the patient is in lithotomy position. She is prepped with Betadine on the suprapubic, vulvar and vaginal areas. She is draped as usual. The vaginal exam reveals an anteverted uterus, normal volume, no adnexal mass.  A speculum was introduced in the vagina. The anterior lip of the cervix is grasped with a tenaculum. A paracervical block is done with Nesacaine 1% a total of 20 cc at 4 and 8:00.  The hysterometry is at 11 cm and the cervical length is 5 cm, for a cavity length of 6 cm.  Dilation of the cervix with Hegar dilators up to #33 without difficulty. A diagnostic hysteroscopy is done and revealed a normal cavity, both ostia are visualized and pictures are taken.  A curettage of the intrauterine cavity is done with a sharp curet on all surfaces. The specimen is sent to pathology. We then proceeded with on the NovaSure endometrial ablation. The cavity width was measured at 4.4 cm. The integrity test was passed. The ablation was done as a power of 145 at 1 minute and 8 seconds. The instrument was then removed easily. The tenaculum was removed from the cervix. Hemostasis was adequate. The speculum was also removed. No complication occurred and the patient was brought to recovery room in good and stable status.  ESTIMATED BLOOD LOSS: 25 cc Deficit: 5 cc   Intake/Output Summary (Last 24 hours) at 10/19/12 1245 Last data filed at 10/19/12 1241  Gross per 24 hour  Intake   1500 ml  Output    100 ml  Net   1400 ml     BLOOD ADMINISTERED:none   LOCAL MEDICATIONS USED:  Nesacaine 1% 20 cc  SPECIMEN:  Endometrial  curettings  DISPOSITION OF SPECIMEN:  PATHOLOGY  COUNTS:  YES  PLAN OF CARE: Transfer to PACU  Genia Del MD  10/19/12  At 12:45 pm

## 2012-10-19 NOTE — Anesthesia Postprocedure Evaluation (Signed)
Anesthesia Post Note  Patient: Katherine Levine  Procedure(s) Performed: Procedure(s) (LRB): DILATATION & CURETTAGE/HYSTEROSCOPY WITH NOVASURE ABLATION (N/A)  Anesthesia type: General  Patient location: PACU  Post pain: Pain level controlled  Post assessment: Post-op Vital signs reviewed  Last Vitals:  Filed Vitals:   10/19/12 1300  BP: 115/71  Pulse: 58  Temp:   Resp: 16    Post vital signs: Reviewed  Level of consciousness: sedated  Complications: No apparent anesthesia complicationsfj

## 2012-10-19 NOTE — H&P (Signed)
Katherine Levine is an 31 y.o. female G2P2 S/P BT/S  RP:  Menorrhagia for [facility] Novasure endometrial ablation  Pertinent Gynecological History: Menses: Menorrhagia Contraception: BT/S Blood transfusions: None Previous GYN Procedure:  BT/S Last pap: wnl   Menstrual History:  No LMP recorded.    Past Medical History  Diagnosis Date  . Kidney stone   . Obese   . Renal stone   . Nodule on liver     Since 2009 multinodular as seen on MRI seem to be a benign cause.    Past Surgical History  Procedure Date  . Cesarean section   . Tubal ligation   . Cholecystectomy     When she was 14  . Liver resection July 2012    [facility]    History reviewed. No pertinent family history.  Social History:  reports that she has never smoked. She does not have any smokeless tobacco history on file. She reports that she does not drink alcohol or use illicit drugs.  Allergies:  Allergies  Allergen Reactions  . Ciprofloxacin Rash    Red warm rash on arm of with IV running Cipro  . Fexofenadine Itching and Nausea And Vomiting  . Meperidine Hcl Itching    Red splotches on skin  . Amoxicillin Itching and Nausea And Vomiting  . Azithromycin Itching and Nausea And Vomiting    Prescriptions prior to admission  Medication Sig Dispense Refill  . ferrous sulfate 325 (65 FE) MG tablet Take 325 mg by mouth daily with breakfast.       . Multiple Vitamins-Minerals (MULTIVITAMIN WITH MINERALS) tablet Take 1 tablet by mouth daily.      . tranexamic acid (LYSTEDA) 650 MG TABS Take 2 tablets (1,300 mg total) by mouth 3 (three) times daily as needed. Only takes when on menstrual period  30 tablet  2     Blood pressure 125/81, pulse 80, temperature 98.1 F (36.7 C), temperature source Oral, resp. rate 20, height 5\' 6"  (1.676 m), weight 81.647 kg (180 lb), SpO2 100.00%.   Results for orders placed during the hospital encounter of 10/19/12 (from the past 24 hour(s))  PREGNANCY, URINE     Status: Normal   Collection Time   10/19/12 10:29 AM      Component Value Range   Preg Test, Ur NEGATIVE  NEGATIVE    No results found.  Assessment/Plan: Menorrhagia for HSC/Novasure endometrial ablation.  Surgery and risks reviewed.  Renaye Janicki,MARIE-LYNE 10/19/2012, 11:59 AM

## 2012-10-19 NOTE — Transfer of Care (Signed)
Immediate Anesthesia Transfer of Care Note  Patient: Katherine Levine  Procedure(s) Performed: Procedure(s) (LRB) with comments: DILATATION & CURETTAGE/HYSTEROSCOPY WITH NOVASURE ABLATION (N/A)  Patient Location: PACU  Anesthesia Type:General  Level of Consciousness: awake, alert , oriented and patient cooperative  Airway & Oxygen Therapy: Patient Spontanous Breathing and Patient connected to nasal cannula oxygen  Post-op Assessment: Report given to PACU RN, Post -op Vital signs reviewed and stable and Patient moving all extremities X 4  Post vital signs: Reviewed and stable  Complications: No apparent anesthesia complications

## 2012-10-20 ENCOUNTER — Encounter (HOSPITAL_COMMUNITY): Payer: Self-pay | Admitting: Obstetrics & Gynecology

## 2012-11-10 ENCOUNTER — Telehealth: Payer: Self-pay | Admitting: *Deleted

## 2012-11-10 ENCOUNTER — Emergency Department (HOSPITAL_COMMUNITY)
Admission: EM | Admit: 2012-11-10 | Discharge: 2012-11-10 | Disposition: A | Discharge status: Home / Self Care | Payer: 59 | Attending: Emergency Medicine | Admitting: Emergency Medicine

## 2012-11-10 ENCOUNTER — Emergency Department (HOSPITAL_COMMUNITY): Payer: 59

## 2012-11-10 ENCOUNTER — Encounter (HOSPITAL_COMMUNITY): Payer: Self-pay

## 2012-11-10 DIAGNOSIS — Z87442 Personal history of urinary calculi: Secondary | ICD-10-CM | POA: Insufficient documentation

## 2012-11-10 DIAGNOSIS — Z8719 Personal history of other diseases of the digestive system: Secondary | ICD-10-CM | POA: Insufficient documentation

## 2012-11-10 DIAGNOSIS — E669 Obesity, unspecified: Secondary | ICD-10-CM | POA: Insufficient documentation

## 2012-11-10 DIAGNOSIS — Z3202 Encounter for pregnancy test, result negative: Secondary | ICD-10-CM | POA: Insufficient documentation

## 2012-11-10 DIAGNOSIS — R112 Nausea with vomiting, unspecified: Secondary | ICD-10-CM | POA: Insufficient documentation

## 2012-11-10 DIAGNOSIS — R42 Dizziness and giddiness: Secondary | ICD-10-CM | POA: Insufficient documentation

## 2012-11-10 DIAGNOSIS — K529 Noninfective gastroenteritis and colitis, unspecified: Secondary | ICD-10-CM

## 2012-11-10 DIAGNOSIS — K5289 Other specified noninfective gastroenteritis and colitis: Secondary | ICD-10-CM | POA: Insufficient documentation

## 2012-11-10 DIAGNOSIS — R5381 Other malaise: Secondary | ICD-10-CM | POA: Insufficient documentation

## 2012-11-10 DIAGNOSIS — Z9851 Tubal ligation status: Secondary | ICD-10-CM | POA: Insufficient documentation

## 2012-11-10 DIAGNOSIS — R1011 Right upper quadrant pain: Secondary | ICD-10-CM | POA: Insufficient documentation

## 2012-11-10 DIAGNOSIS — Z79899 Other long term (current) drug therapy: Secondary | ICD-10-CM | POA: Insufficient documentation

## 2012-11-10 LAB — COMPREHENSIVE METABOLIC PANEL
ALT: 14 U/L (ref 0–35)
Alkaline Phosphatase: 68 U/L (ref 39–117)
CO2: 24 mEq/L (ref 19–32)
GFR calc Af Amer: >90 mL/min (ref >90)
GFR calc non Af Amer: >90 mL/min (ref >90)
Glucose, Bld: 122 mg/dL — ABNORMAL HIGH (ref 70–99)
Potassium: 3.5 mEq/L (ref 3.5–5.1)
Sodium: 138 mEq/L (ref 135–145)
Total Bilirubin: 0.9 mg/dL (ref 0.3–1.2)

## 2012-11-10 LAB — CBC WITH DIFFERENTIAL/PLATELET
Eosinophils Relative: 0 % (ref 0–5)
Hemoglobin: 14.7 g/dL (ref 12.0–15.0)
Lymphocytes Relative: 7 % — ABNORMAL LOW (ref 12–46)
Lymphs Abs: 0.6 10*3/uL — ABNORMAL LOW (ref 0.7–4.0)
MCV: 87.9 fL (ref 78.0–100.0)
Neutrophils Relative %: 91 % — ABNORMAL HIGH (ref 43–77)
Platelets: 189 10*3/uL (ref 150–400)
RBC: 4.94 MIL/uL (ref 3.87–5.11)
WBC: 9 10*3/uL (ref 4.0–10.5)

## 2012-11-10 LAB — URINALYSIS, MICROSCOPIC ONLY
Bilirubin Urine: NEGATIVE
Protein, ur: NEGATIVE mg/dL
Urobilinogen, UA: 0.2 mg/dL (ref 0.0–1.0)

## 2012-11-10 MED ORDER — ONDANSETRON HCL 4 MG/2ML IJ SOLN
4.0000 mg | Freq: Once | INTRAMUSCULAR | Status: AC
  Administered 2012-11-10: 4 mg via INTRAVENOUS
  Filled 2012-11-10: qty 2

## 2012-11-10 MED ORDER — MORPHINE SULFATE 4 MG/ML IJ SOLN
4.0000 mg | Freq: Once | INTRAMUSCULAR | Status: AC
  Administered 2012-11-10: 4 mg via INTRAVENOUS
  Filled 2012-11-10: qty 1

## 2012-11-10 MED ORDER — ONDANSETRON HCL 4 MG PO TABS: 4.0000 mg | ORAL_TABLET | Freq: Four times a day (QID) | ORAL | Status: DC

## 2012-11-10 MED ORDER — SODIUM CHLORIDE 0.9 % IV BOLUS (SEPSIS)
1000.0000 mL | Freq: Once | INTRAVENOUS | Status: AC
  Administered 2012-11-10: 1000 mL via INTRAVENOUS

## 2012-11-10 MED ORDER — PROMETHAZINE HCL 25 MG PO TABS: 25.0000 mg | ORAL_TABLET | Freq: Four times a day (QID) | ORAL | Status: DC | PRN

## 2012-11-10 MED ORDER — DICYCLOMINE HCL 20 MG PO TABS: 20.0000 mg | ORAL_TABLET | Freq: Two times a day (BID) | ORAL | Status: DC

## 2012-11-10 MED ORDER — SODIUM CHLORIDE 0.9 % IV SOLN
1000.0000 mL | Freq: Once | INTRAVENOUS | Status: AC
  Administered 2012-11-10: 1000 mL via INTRAVENOUS

## 2012-11-10 NOTE — ED Provider Notes (Signed)
History     CSN: [id number]  Arrival date & time 11/10/12  1341   First MD Initiated Contact with Patient 11/10/12 1508      Chief Complaint  Patient presents with  . Diarrhea  . Nausea  . Emesis    (Consider location/radiation/quality/duration/timing/severity/associated sxs/prior treatment) HPI Pt presents for 12 hours of N/V/D. No fever or chills. Pt has had persistent RUQ pain for several weeks. No worse today. Pt had partial hepectomy complicated by abscess this summer. 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  Past Medical History  Diagnosis Date  . Kidney stone   . Obese   . Renal stone   . Nodule on liver     Since 2009 multinodular as seen on MRI seem to be a benign cause.    Past Surgical History  Procedure Date  . Cesarean section   . Tubal ligation   . Cholecystectomy     When she was 14  . Liver resection July 2012    [facility]  . Dilitation & currettage/hystroscopy with novasure ablation 10/19/2012    Procedure: DILATATION & CURETTAGE/HYSTEROSCOPY WITH NOVASURE ABLATION;  Surgeon: Genia Del, MD;  Location: [facility];  Service: Gynecology;  Laterality: N/A;    No family history on file.  History  Substance Use Topics  . Smoking status: Never Smoker   . Smokeless tobacco: Not on file  . Alcohol Use: No    OB History    Grav Para Term Preterm Abortions TAB SAB Ect Mult Living                  Review of Systems  Constitutional: Positive for fatigue. Negative for fever and chills.  Respiratory: Negative for cough, shortness of breath and wheezing.   Cardiovascular: Negative for chest pain and palpitations.  Gastrointestinal: Positive for nausea, vomiting, abdominal pain and diarrhea. Negative for blood in stool.  Genitourinary: Negative for dysuria, flank pain and enuresis.  Musculoskeletal: Negative for myalgias and back pain.  Skin: Negative for rash and wound.  Neurological: Positive for light-headedness. Negative for syncope,  numbness and headaches.    Allergies  Ciprofloxacin; Fexofenadine; Meperidine hcl; Amoxicillin; and Azithromycin  Home Medications   Current Outpatient Rx  Name  Route  Sig  Dispense  Refill  . FERROUS SULFATE 325 (65 FE) MG PO TABS   Oral   Take 325 mg by mouth daily with breakfast.          . PROMETHAZINE HCL 25 MG PO TABS   Oral   Take 25 mg by mouth every 6 (six) hours as needed. For nausea.         Marland Kitchen DICYCLOMINE HCL 20 MG PO TABS   Oral   Take 1 tablet (20 mg total) by mouth 2 (two) times daily.   20 tablet   0   . HYDROCODONE-ACETAMINOPHEN 5-500 MG PO TABS   Oral   Take 1 tablet by mouth every 6 (six) hours as needed for pain.   30 tablet   0   . MULTI-VITAMIN/MINERALS PO TABS   Oral   Take 1 tablet by mouth daily.         Marland Kitchen ONDANSETRON HCL 4 MG PO TABS   Oral   Take 1 tablet (4 mg total) by mouth every 6 (six) hours.   12 tablet   0   . PROMETHAZINE HCL 12.5 MG RE SUPP   Rectal   Place 1 suppository (12.5 mg total) rectally every  6 (six) hours as needed for nausea.   12 each   1   . PROMETHAZINE HCL 25 MG PO TABS   Oral   Take 1 tablet (25 mg total) by mouth every 6 (six) hours as needed for nausea.   30 tablet   0   . TRAMADOL HCL 50 MG PO TABS      TAKE 1 TABLET BY MOUTH EVERY 8 HOURS AS NEEDED FOR PAIN   30 tablet   0     BP 108/68  Pulse 110  Temp 98.2 F (36.8 C) (Axillary)  Resp 18  SpO2 98%  LMP 09/27/2012  Physical Exam  Nursing note and vitals reviewed. Constitutional: She is oriented to person, place, and time. She appears well-developed and well-nourished. No distress.  HENT:  Head: Normocephalic and atraumatic.  Mouth/Throat: Oropharynx is clear and moist.  Eyes: EOM are normal. Pupils are equal, round, and reactive to light.  Neck: Normal range of motion. Neck supple.  Cardiovascular: Regular rhythm.        Tachycardia  Pulmonary/Chest: Effort normal and breath sounds normal. No respiratory distress. She has no  wheezes. She has no rales. She exhibits no tenderness.  Abdominal: Soft. Bowel sounds are normal. She exhibits no distension and no mass. There is no tenderness. There is no rebound and no guarding.  Musculoskeletal: Normal range of motion. She exhibits no edema and no tenderness.  Neurological: She is alert and oriented to person, place, and time.  Skin: Skin is warm and dry. No rash noted. No erythema.  Psychiatric: She has a normal mood and affect. Her behavior is normal.    ED Course  Procedures (including critical care time)  Labs Reviewed  CBC WITH DIFFERENTIAL - Abnormal; Notable for the following:    Neutrophils Relative 91 (*)     Neutro Abs 8.2 (*)     Lymphocytes Relative 7 (*)     Lymphs Abs 0.6 (*)     Monocytes Relative 2 (*)     All other components within normal limits  COMPREHENSIVE METABOLIC PANEL - Abnormal; Notable for the following:    Glucose, Bld 122 (*)     All other components within normal limits  URINALYSIS, MICROSCOPIC ONLY - Abnormal; Notable for the following:    APPearance HAZY (*)     Hgb urine dipstick SMALL (*)     Ketones, ur 15 (*)     Squamous Epithelial / LPF FEW (*)     All other components within normal limits  LIPASE, BLOOD  POCT PREGNANCY, URINE   US Abdomen Complete  11/10/2012  *RADIOLOGY REPORT*  Clinical Data:  Right upper quadrant abdominal pain.  Surgical history includes cholecystectomy and partial left hepatectomy.  COMPLETE ABDOMINAL ULTRASOUND  Comparison:  CT abdomen and pelvis 07/10/2012, 03/12/2012, 01/22/2012.  Findings:  Gallbladder:  Surgically absent.  Common bile duct:  Normal in caliber with maximum diameter approximating 6 mm.  The duct is obscured distally by duodenal bowel gas.  Liver:  Prior partial left hepatectomy.  Normal parenchymal echotexture without focal parenchymal abnormality.  Patent portal vein with hepatopetal flow.  Interval resolution of perihepatic fluid collections since the most recent CT from August,  2013.  IVC:  Patent.  Pancreas:  Obscured by overlying bowel gas and therefore not evaluated.  Spleen:  Normal size and echotexture without focal parenchymal abnormality.  Right Kidney:  No hydronephrosis.  Well-preserved cortex.  No shadowing calculi.  Normal size and parenchymal echotexture without focal abnormalities.  Approximately 11.5 cm in length.  Left Kidney:  No hydronephrosis.  Well-preserved cortex.  No shadowing calculi.  Normal size and parenchymal echotexture without focal abnormalities.  Approximately 11.8 cm in length.  Abdominal aorta:  Normal in caliber throughout its visualized course in the abdomen without significant atherosclerosis.  The mid portion of the aorta was obscured by overlying bowel gas and was therefore not evaluated.  IMPRESSION: Normal examination post cholecystectomy and partial left hepatectomy with a caveat that the pancreas, distal common bile duct, and mid abdominal aorta were obscured by overlying bowel gas and were therefore not evaluated.   Original Report Authenticated By: Hulan Saas, M.D.      1. Gastroenteritis       MDM  Likely viral AGE. Will treat with IVF's. Though doubt complication from prev surgery, will obtain US.   Improved HR. States she is feeling better. Tolerating PO's. Encouraged to return for worsening symptoms or any concerns      Loren Racer, MD 11/10/12 2201

## 2012-11-10 NOTE — ED Notes (Signed)
Meal tray provided.  MD at bedside.

## 2012-11-10 NOTE — ED Notes (Signed)
Last night pt started having n/v/d for past 12 hours.  Pt last vomited 2 hours ago.  Pt called EMS twice.  Pt now complaining of dizziness and weakness.  HR 130's HR 130/74.  No orthostatic changes.  Pt has RUQ abd pain for three weeks.

## 2012-11-10 NOTE — ED Notes (Signed)
MD at bedside. 

## 2012-11-10 NOTE — Telephone Encounter (Signed)
Received call from patient's mother then I called and spoke with patient. States she  Has had vomiting and diarrhea all night and this AM . Projectile vomiting she states. She is unable to hold liquids down .  Has also taken phenergan. Advised to go to ED now for evaluation and treatment. She voices understanding.

## 2012-11-10 NOTE — ED Notes (Signed)
Pt ambulated to RR with NT for urine sample.

## 2013-04-01 ENCOUNTER — Ambulatory Visit (INDEPENDENT_AMBULATORY_CARE_PROVIDER_SITE_OTHER): Payer: 59 | Admitting: Family Medicine

## 2013-04-01 ENCOUNTER — Encounter: Payer: Self-pay | Admitting: Family Medicine

## 2013-04-01 VITALS — BP 124/81 | HR 77 | Temp 98.5°F | Ht 66.0 in | Wt 202.0 lb

## 2013-04-01 DIAGNOSIS — Z0000 Encounter for general adult medical examination without abnormal findings: Secondary | ICD-10-CM | POA: Insufficient documentation

## 2013-04-01 NOTE — Progress Notes (Signed)
  Subjective:    Patient ID: Katherine Levine, female    DOB: Oct 13, 1981, 32 y.o.   MRN: [id number]  HPI:  Katherine Levine comes in for a physical.  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  She says she is doing very well, feeling much better than the last time she saw me.  She had a uterine ablation/d&c and her pelvic pain and bleeding are much improved.  Her fatigue has improved too.    She reports the only medication she is taking is Claritin OTC and she has no complaints.   Past Medical History  Diagnosis Date  . Kidney stone   . Obese   . Renal stone   . Nodule on liver     Resected in 2013.    Past Surgical History  Procedure Laterality Date  . Cesarean section    . Tubal ligation    . Cholecystectomy      When she was 14  . Liver resection  July 2012    [facility]  . Dilitation & currettage/hystroscopy with novasure ablation  10/19/2012    Procedure: DILATATION & CURETTAGE/HYSTEROSCOPY WITH NOVASURE ABLATION;  Surgeon: Genia Del, MD;  Location: [facility];  Service: Gynecology;  Laterality: N/A;    History  Substance Use Topics  . Smoking status: Never Smoker   . Smokeless tobacco: Not on file  . Alcohol Use: No    No family history on file.   ROS 12-Point ROS Negative    Objective:  Physical Exam:  BP 124/81  Pulse 77  Temp(Src) 98.5 F (36.9 C) (Oral)  Ht 5\' 6"  (1.676 m)  Wt 202 lb (91.627 kg)  BMI 32.62 kg/m2  LMP 03/21/2013 General appearance: alert, cooperative and no distress Head: Normocephalic, without obvious abnormality, atraumatic HEENT: PERRL, EOMIT, Oral mucosa moist, no lesions, Neck no adenopathy thyroid non tender Lungs: clear to auscultation bilaterally Heart: regular rate and rhythm, S1, S2 normal, no murmur, click, rub or gallop Abd: +BS NT/ND Pulses: 2+ and symmetric       Assessment & Plan:

## 2013-04-01 NOTE — Patient Instructions (Signed)
It is great to see you today, I am glad you are doing so much better.  Good luck with school, remember to try to be as active as possible.   Your Vital Signs today were:  BP 124/81  Pulse 77  Temp(Src) 98.5 F (36.9 C) (Oral)  Ht 5\' 6"  (1.676 m)  Wt 202 lb (91.627 kg)  BMI 32.62 kg/m2  LMP 03/21/2013

## 2013-04-01 NOTE — Assessment & Plan Note (Signed)
Patient with BMI=32 but no other abnormalities on exam.  Discussed exercise and diet.  She is up to date on immunizations and other health maintenance. Hearing and vision normal. 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

## 2013-06-01 ENCOUNTER — Encounter: Payer: Self-pay | Admitting: Family Medicine

## 2013-06-01 ENCOUNTER — Ambulatory Visit (INDEPENDENT_AMBULATORY_CARE_PROVIDER_SITE_OTHER): Payer: 59 | Admitting: Family Medicine

## 2013-06-01 VITALS — BP 126/80 | HR 75 | Temp 98.3°F | Ht 66.0 in | Wt 200.0 lb

## 2013-06-01 DIAGNOSIS — T300 Burn of unspecified body region, unspecified degree: Secondary | ICD-10-CM

## 2013-06-01 DIAGNOSIS — Z23 Encounter for immunization: Secondary | ICD-10-CM

## 2013-06-01 HISTORY — DX: Burn of unspecified body region, unspecified degree: T30.0

## 2013-06-01 NOTE — Assessment & Plan Note (Signed)
Patient with small superficial burn to left index finger. Appears to be healing well. Plan: continue antibiotic ointment and dressings. Will update tetanus shot. F/u in one week to ensure healing well and no infection.

## 2013-06-01 NOTE — Progress Notes (Signed)
  Subjective:    Patient ID: Katherine Levine, female    DOB: 1981/05/11, 32 y.o.   MRN: [id number]  HPI patient is a 32 yo female who presents for evaluation of burn on her index finger.  Occurred 2 days ago. Initially was red and had portion of skin that flapped off. Used cold water and ice for pain initially. Also using OTC antibiotic ointment and dressing it daily. Pain has resolved and states it is looking improved.   Review of Systems see HPI     Objective:   Physical Exam  Constitutional: She appears well-developed and well-nourished.  Skin: Skin is warm.  Left index finger: just proximal to the nail there is an area of erythema about 0.5 cm in diameter, NT to touch, with small area of skin flap apparent in the middle, no drainage or signs of infection  BP 126/80  Pulse 75  Temp(Src) 98.3 F (36.8 C) (Oral)  Ht 5\' 6"  (1.676 m)  Wt 200 lb (90.719 kg)  BMI 32.3 kg/m2    Assessment & Plan:

## 2013-06-01 NOTE — Patient Instructions (Signed)
Nice to meet you. Please continue to use the antibiotic ointment and dressings for your burn.  We will update your tetanus shot. I will see you back in one week to ensure it is healing well.

## 2013-09-03 ENCOUNTER — Emergency Department (HOSPITAL_COMMUNITY)
Admission: EM | Admit: 2013-09-03 | Discharge: 2013-09-03 | Disposition: A | Discharge status: Home / Self Care | Payer: 59 | Source: Home / Self Care | Attending: Emergency Medicine | Admitting: Emergency Medicine

## 2013-09-03 ENCOUNTER — Encounter (HOSPITAL_COMMUNITY): Payer: Self-pay | Admitting: *Deleted

## 2013-09-03 DIAGNOSIS — H6690 Otitis media, unspecified, unspecified ear: Secondary | ICD-10-CM

## 2013-09-03 DIAGNOSIS — H6691 Otitis media, unspecified, right ear: Secondary | ICD-10-CM

## 2013-09-03 MED ORDER — HYDROCODONE-ACETAMINOPHEN 5-325 MG PO TABS: ORAL_TABLET | ORAL | Status: DC

## 2013-09-03 MED ORDER — CEFDINIR 300 MG PO CAPS: 300.0000 mg | ORAL_CAPSULE | Freq: Two times a day (BID) | ORAL | Status: DC

## 2013-09-03 MED ORDER — PREDNISONE 20 MG PO TABS: ORAL_TABLET | ORAL | Status: DC

## 2013-09-03 MED ORDER — FLUTICASONE PROPIONATE 50 MCG/ACT NA SUSP: 2.0000 | Freq: Every day | NASAL | Status: DC

## 2013-09-03 NOTE — ED Notes (Signed)
Pt             Reports   History   Of          Sinus  Pressure         Hearing  Loss          And            Earache     X   4  Days   Had uri  Last  Week   -  The  Pt       Reports  Symptoms  Not  releived  By otc  meds

## 2013-09-03 NOTE — ED Provider Notes (Signed)
Chief Complaint:   Chief Complaint  Patient presents with  . Facial Pain    History of Present Illness:   Katherine Levine is a 32 year old female who has had a four-day history of right ear pain and congestion. She's also had some nasal congestion, rhinorrhea with yellow drainage, headache, sinus pressure, and sore throat. She denies any fever or cough. No prior history of ear problems in the past.  Review of Systems:  Other than noted above, the patient denies any of the following symptoms: Systemic:  No fevers, chills, sweats, weight loss or gain, fatigue, or tiredness. Eye:  No redness, pain, discharge, itching, blurred vision, or diplopia. ENT:  No headache, nasal congestion, sneezing, itching, epistaxis, ear pain, congestion, decreased hearing, ringing in ears, vertigo, or tinnitus.  No oral lesions, sore throat, pain on swallowing, or hoarseness. Neck:  No mass, tenderness or adenopathy. Lungs:  No coughing, wheezing, or shortness of breath. Skin:  No rash or itching.  PMFSH:  Past medical history, family history, social history, meds, and allergies were reviewed.   Physical Exam:   Vital signs:  BP 132/87  Pulse 65  Temp(Src) 98.2 F (36.8 C) (Oral)  Resp 18  SpO2 100% General:  Alert and oriented.  In no distress.  Skin warm and dry. Eye:  PERRL, full EOMs, lids and conjunctiva normal.   ENT:  There is fluid behind the right TM and slight erythema. Canals normal, left TM and canal are normal.  Nasal mucosa not congested and without drainage.  Mucous membranes moist, no oral lesions, normal dentition, pharynx clear.  No cranial or facial pain to palplation. Neck:  Supple, full ROM.  No adenopathy, tenderness or mass.  Thyroid normal. Lungs:  Breath sounds clear and equal bilaterally.  No wheezes, rales or rhonchi. Heart:  Rhythm regular, without extrasystoles.  No gallops or murmers. Skin:  Clear, warm and dry.  Assessment:  The encounter diagnosis was Right otitis  media.  Plan:   1.  Meds:  The following meds were prescribed:   Discharge Medication List as of 09/03/2013  6:54 PM    START taking these medications   Details  cefdinir (OMNICEF) 300 MG capsule Take 1 capsule (300 mg total) by mouth 2 (two) times daily., Starting 09/03/2013, Until Discontinued, Normal    fluticasone (FLONASE) 50 MCG/ACT nasal spray Place 2 sprays into the nose daily., Starting 09/03/2013, Until Discontinued, Normal    HYDROcodone-acetaminophen (NORCO/VICODIN) 5-325 MG per tablet 1 to 2 tabs every 4 to 6 hours as needed for pain., Print    predniSONE (DELTASONE) 20 MG tablet 3 daily for 4 days, 2 daily for 4 days, 1 daily for 4 days., Normal        2.  Patient Education/Counseling:  The patient was given appropriate handouts, self care instructions, and instructed in symptomatic relief.  Instructed in Polizeration.  3.  Follow up:  The patient was told to follow up if no better in 3 to 4 days, if becoming worse in any way, and given some red flag symptoms such as worsening pain which would prompt immediate return.  Follow up with Dr. Suzanna Obey if no better in 2 weeks.     Reuben Likes, MD 09/03/13 2036

## 2014-03-12 ENCOUNTER — Emergency Department (HOSPITAL_BASED_OUTPATIENT_CLINIC_OR_DEPARTMENT_OTHER)
Admission: EM | Admit: 2014-03-12 | Discharge: 2014-03-12 | Disposition: A | Discharge status: Home / Self Care | Payer: 59 | Attending: Emergency Medicine | Admitting: Emergency Medicine

## 2014-03-12 ENCOUNTER — Encounter (HOSPITAL_BASED_OUTPATIENT_CLINIC_OR_DEPARTMENT_OTHER): Payer: Self-pay | Admitting: Emergency Medicine

## 2014-03-12 ENCOUNTER — Emergency Department (HOSPITAL_BASED_OUTPATIENT_CLINIC_OR_DEPARTMENT_OTHER): Payer: 59

## 2014-03-12 DIAGNOSIS — E669 Obesity, unspecified: Secondary | ICD-10-CM | POA: Insufficient documentation

## 2014-03-12 DIAGNOSIS — Z9089 Acquired absence of other organs: Secondary | ICD-10-CM | POA: Insufficient documentation

## 2014-03-12 DIAGNOSIS — R160 Hepatomegaly, not elsewhere classified: Secondary | ICD-10-CM

## 2014-03-12 DIAGNOSIS — K7689 Other specified diseases of liver: Secondary | ICD-10-CM | POA: Insufficient documentation

## 2014-03-12 DIAGNOSIS — Z9889 Other specified postprocedural states: Secondary | ICD-10-CM | POA: Insufficient documentation

## 2014-03-12 DIAGNOSIS — Z9851 Tubal ligation status: Secondary | ICD-10-CM | POA: Insufficient documentation

## 2014-03-12 DIAGNOSIS — R109 Unspecified abdominal pain: Secondary | ICD-10-CM

## 2014-03-12 DIAGNOSIS — Z3202 Encounter for pregnancy test, result negative: Secondary | ICD-10-CM | POA: Insufficient documentation

## 2014-03-12 DIAGNOSIS — Z87442 Personal history of urinary calculi: Secondary | ICD-10-CM | POA: Insufficient documentation

## 2014-03-12 DIAGNOSIS — Z880 Allergy status to penicillin: Secondary | ICD-10-CM | POA: Insufficient documentation

## 2014-03-12 LAB — URINALYSIS, ROUTINE W REFLEX MICROSCOPIC
BILIRUBIN URINE: NEGATIVE
Glucose, UA: NEGATIVE mg/dL
HGB URINE DIPSTICK: NEGATIVE
KETONES UR: NEGATIVE mg/dL
Leukocytes, UA: NEGATIVE
NITRITE: NEGATIVE
Protein, ur: NEGATIVE mg/dL
SPECIFIC GRAVITY, URINE: 1.008 (ref 1.005–1.030)
UROBILINOGEN UA: 0.2 mg/dL (ref 0.0–1.0)
pH: 7 (ref 5.0–8.0)

## 2014-03-12 LAB — COMPREHENSIVE METABOLIC PANEL
ALT: 13 U/L (ref 0–35)
AST: 14 U/L (ref 0–37)
Albumin: 4.4 g/dL (ref 3.5–5.2)
Alkaline Phosphatase: 72 U/L (ref 39–117)
BUN: 10 mg/dL (ref 6–23)
CALCIUM: 9.4 mg/dL (ref 8.4–10.5)
CO2: 25 meq/L (ref 19–32)
CREATININE: 0.9 mg/dL (ref 0.50–1.10)
Chloride: 103 mEq/L (ref 96–112)
GFR, EST NON AFRICAN AMERICAN: 82 mL/min — AB (ref >90)
GLUCOSE: 106 mg/dL — AB (ref 70–99)
Potassium: 4.3 mEq/L (ref 3.7–5.3)
Sodium: 140 mEq/L (ref 137–147)
TOTAL PROTEIN: 7.3 g/dL (ref 6.0–8.3)
Total Bilirubin: 0.4 mg/dL (ref 0.3–1.2)

## 2014-03-12 LAB — CBC WITH DIFFERENTIAL/PLATELET
Basophils Absolute: 0 10*3/uL (ref 0.0–0.1)
Basophils Relative: 0 % (ref 0–1)
EOS ABS: 0.1 10*3/uL (ref 0.0–0.7)
EOS PCT: 1 % (ref 0–5)
HEMATOCRIT: 44.1 % (ref 36.0–46.0)
Hemoglobin: 15.1 g/dL — ABNORMAL HIGH (ref 12.0–15.0)
LYMPHS ABS: 1.9 10*3/uL (ref 0.7–4.0)
LYMPHS PCT: 23 % (ref 12–46)
MCH: 30.9 pg (ref 26.0–34.0)
MCHC: 34.2 g/dL (ref 30.0–36.0)
MCV: 90.4 fL (ref 78.0–100.0)
MONO ABS: 0.5 10*3/uL (ref 0.1–1.0)
Monocytes Relative: 7 % (ref 3–12)
Neutro Abs: 5.5 10*3/uL (ref 1.7–7.7)
Neutrophils Relative %: 69 % (ref 43–77)
Platelets: 209 10*3/uL (ref 150–400)
RBC: 4.88 MIL/uL (ref 3.87–5.11)
RDW: 13.4 % (ref 11.5–15.5)
WBC: 8 10*3/uL (ref 4.0–10.5)

## 2014-03-12 LAB — PREGNANCY, URINE: Preg Test, Ur: NEGATIVE

## 2014-03-12 LAB — LIPASE, BLOOD: LIPASE: 16 U/L (ref 11–59)

## 2014-03-12 MED ORDER — OXYCODONE-ACETAMINOPHEN 5-325 MG PO TABS: 1.0000 | ORAL_TABLET | ORAL | Status: DC | PRN

## 2014-03-12 MED ORDER — OXYCODONE-ACETAMINOPHEN 5-325 MG PO TABS
1.0000 | ORAL_TABLET | Freq: Once | ORAL | Status: AC
  Administered 2014-03-12: 1 via ORAL
  Filled 2014-03-12: qty 1

## 2014-03-12 NOTE — ED Provider Notes (Addendum)
CSN: [id number]     Arrival date & time 03/12/14  1224 History   First MD Initiated Contact with Patient 03/12/14 1247     Chief Complaint  Patient presents with  . Abdominal Pain     (Consider location/radiation/quality/duration/timing/severity/associated sxs/prior Treatment) Patient is a 33 y.o. female presenting with abdominal pain. The history is provided by the patient. No language interpreter was used.  Abdominal Pain Pain quality: sharp   Pain radiates to:  Does not radiate Pain severity:  Moderate Relieved by:  Nothing Worsened by:  Nothing tried Ineffective treatments:  NSAIDs Associated symptoms comment:  Right sided abdominal pain that started while at rest last night and has been constant since that time. No nausea, vomiting, fever. She had a bowel movement this morning that did not affect the character of the pain.    Past Medical History  Diagnosis Date  . Kidney stone   . Obese   . Renal stone   . Nodule on liver     Resected in 2013.    Past Surgical History  Procedure Laterality Date  . Cesarean section    . Tubal ligation    . Cholecystectomy      When she was 59  . Liver resection  July 2012    [facility]  . Dilitation & currettage/hystroscopy with novasure ablation  10/19/2012    Procedure: DILATATION & CURETTAGE/HYSTEROSCOPY WITH NOVASURE ABLATION;  Surgeon: Princess Bruins, MD;  Location: [facility];  Service: Gynecology;  Laterality: N/A;   No family history on file. History  Substance Use Topics  . Smoking status: Never Smoker   . Smokeless tobacco: Not on file  . Alcohol Use: No   OB History   Grav Para Term Preterm Abortions TAB SAB Ect Mult Living                 Review of Systems  Gastrointestinal: Positive for abdominal pain.      Allergies  Ciprofloxacin; Fexofenadine; Meperidine hcl; Amoxicillin; and Azithromycin  Home Medications  No current outpatient prescriptions on file. BP 163/103  Pulse 77  Temp(Src) 98.7 F (37.1 C)   Resp 20  SpO2 100%  LMP 02/21/2014 Physical Exam  Constitutional: She appears well-developed and well-nourished.  HENT:  Head: Normocephalic.  Neck: Normal range of motion. Neck supple.  Cardiovascular: Normal rate and regular rhythm.   Pulmonary/Chest: Effort normal and breath sounds normal.  Abdominal: Soft. Bowel sounds are normal. There is tenderness. There is no rebound and no guarding.  Right upper and lower abdominal tenderness without guarding.   Musculoskeletal: Normal range of motion.  Neurological: She is alert. No cranial nerve deficit.  Skin: Skin is warm and dry. No rash noted.  Psychiatric: She has a normal mood and affect.    ED Course  Procedures (including critical care time) Labs Review Labs Reviewed  URINALYSIS, ROUTINE W REFLEX MICROSCOPIC  PREGNANCY, URINE  CBC WITH DIFFERENTIAL  COMPREHENSIVE METABOLIC PANEL  LIPASE, BLOOD   Results for orders placed during the hospital encounter of 03/12/14  URINALYSIS, ROUTINE W REFLEX MICROSCOPIC      Result Value Ref Range   Color, Urine YELLOW  YELLOW   APPearance CLEAR  CLEAR   Specific Gravity, Urine 1.008  1.005 - 1.030   pH 7.0  5.0 - 8.0   Glucose, UA NEGATIVE  NEGATIVE mg/dL   Hgb urine dipstick NEGATIVE  NEGATIVE   Bilirubin Urine NEGATIVE  NEGATIVE   Ketones, ur NEGATIVE  NEGATIVE mg/dL   Protein,  ur NEGATIVE  NEGATIVE mg/dL   Urobilinogen, UA 0.2  0.0 - 1.0 mg/dL   Nitrite NEGATIVE  NEGATIVE   Leukocytes, UA NEGATIVE  NEGATIVE  PREGNANCY, URINE      Result Value Ref Range   Preg Test, Ur NEGATIVE  NEGATIVE  CBC WITH DIFFERENTIAL      Result Value Ref Range   WBC 8.0  4.0 - 10.5 K/uL   RBC 4.88  3.87 - 5.11 MIL/uL   Hemoglobin 15.1 (*) 12.0 - 15.0 g/dL   HCT 44.1  36.0 - 46.0 %   MCV 90.4  78.0 - 100.0 fL   MCH 30.9  26.0 - 34.0 pg   MCHC 34.2  30.0 - 36.0 g/dL   RDW 13.4  11.5 - 15.5 %   Platelets 209  150 - 400 K/uL   Neutrophils Relative % 69  43 - 77 %   Neutro Abs 5.5  1.7 - 7.7 K/uL    Lymphocytes Relative 23  12 - 46 %   Lymphs Abs 1.9  0.7 - 4.0 K/uL   Monocytes Relative 7  3 - 12 %   Monocytes Absolute 0.5  0.1 - 1.0 K/uL   Eosinophils Relative 1  0 - 5 %   Eosinophils Absolute 0.1  0.0 - 0.7 K/uL   Basophils Relative 0  0 - 1 %   Basophils Absolute 0.0  0.0 - 0.1 K/uL  COMPREHENSIVE METABOLIC PANEL      Result Value Ref Range   Sodium 140  137 - 147 mEq/L   Potassium 4.3  3.7 - 5.3 mEq/L   Chloride 103  96 - 112 mEq/L   CO2 25  19 - 32 mEq/L   Glucose, Bld 106 (*) 70 - 99 mg/dL   BUN 10  6 - 23 mg/dL   Creatinine, Ser 0.90  0.50 - 1.10 mg/dL   Calcium 9.4  8.4 - 10.5 mg/dL   Total Protein 7.3  6.0 - 8.3 g/dL   Albumin 4.4  3.5 - 5.2 g/dL   AST 14  0 - 37 U/L   ALT 13  0 - 35 U/L   Alkaline Phosphatase 72  39 - 117 U/L   Total Bilirubin 0.4  0.3 - 1.2 mg/dL   GFR calc non Af Amer 82 (*) >90 mL/min   GFR calc Af Amer >90  >90 mL/min  LIPASE, BLOOD      Result Value Ref Range   Lipase 16  11 - 59 U/L   US Abdomen Complete  03/12/2014   CLINICAL DATA:  Abdominal pain.  EXAM: ULTRASOUND ABDOMEN COMPLETE  COMPARISON:  Ultrasound of November 10, 2012.  FINDINGS: Gallbladder:  Status post cholecystectomy.  Common bile duct:  Diameter: Measures 8 mm which is within normal limits for a patient who is status post cholecystectomy.  Liver:  2.3 cm hypoechoic area is seen in right hepatic lobe anteriorly which does not represent cyst by ultrasound criteria; further evaluation with MRI is recommended.  IVC:  No abnormality visualized.  Pancreas:  Not visualized due to overlying bowel gas.  Spleen:  Size and appearance within normal limits.  Right Kidney:  Length: 10.2 cm. Echogenicity within normal limits. No mass or hydronephrosis visualized.  Left Kidney:  Length: 10.8 cm. Echogenicity within normal limits. No mass or hydronephrosis visualized.  Abdominal aorta:  No aneurysm visualized.  Other findings:  Evaluation of the umbilical region does not demonstrate definite  abnormality.  IMPRESSION: 2.3 cm hypoechoic area is  seen in anterior portion of right hepatic lobe ; further evaluation with MRI on a nonemergent basis is recommended to evaluate for possible neoplasm. Status post cholecystectomy. Pancreas not well visualized due to overlying bowel gas. No other abnormality seen in the abdomen.   Electronically Signed   By: Sabino Dick M.D.   On: 03/12/2014 14:52   Imaging Review No results found.   EKG Interpretation None      MDM   Final diagnoses:  None    1. Abdominal pain  Liver mass seen on ultrasound felt incidental to sudden onset pain over last several days. In light of her history of benign liver mass, refer back to GI for recommended outpatient MR. Otherwise, evaluation today is unremarkable, pain is improved. VSS. Can be discharged home for recheck with PCP if pain continues.     Dewaine Oats, PA-C 03/12/14 1518  Dewaine Oats, PA-C 04/01/14 0217

## 2014-03-12 NOTE — ED Provider Notes (Signed)
Medical screening examination/treatment/procedure(s) were performed by non-physician practitioner and as supervising physician I was immediately available for consultation/collaboration.   EKG Interpretation None        Osvaldo Shipper, MD 03/12/14 1520

## 2014-03-12 NOTE — ED Notes (Signed)
Patient here with 4 days of intermittent right sided umbilicus pain. Reports this am became constant discomfort. No nausea, no diarrhea. Decreased appetite, no constipation

## 2014-03-12 NOTE — Discharge Instructions (Signed)
Abdominal Pain, Women °Abdominal (stomach, pelvic, or belly) pain can be caused by many things. It is important to tell your doctor: °· The location of the pain. °· Does it come and go or is it present all the time? °· Are there things that start the pain (eating certain foods, exercise)? °· Are there other symptoms associated with the pain (fever, nausea, vomiting, diarrhea)? °All of this is helpful to know when trying to find the cause of the pain. °CAUSES  °· Stomach: virus or bacteria infection, or ulcer. °· Intestine: appendicitis (inflamed appendix), regional ileitis (Crohn's disease), ulcerative colitis (inflamed colon), irritable bowel syndrome, diverticulitis (inflamed diverticulum of the colon), or cancer of the stomach or intestine. °· Gallbladder disease or stones in the gallbladder. °· Kidney disease, kidney stones, or infection. °· Pancreas infection or cancer. °· Fibromyalgia (pain disorder). °· Diseases of the female organs: °· Uterus: fibroid (non-cancerous) tumors or infection. °· Fallopian tubes: infection or tubal pregnancy. °· Ovary: cysts or tumors. °· Pelvic adhesions (scar tissue). °· Endometriosis (uterus lining tissue growing in the pelvis and on the pelvic organs). °· Pelvic congestion syndrome (female organs filling up with blood just before the menstrual period). °· Pain with the menstrual period. °· Pain with ovulation (producing an egg). °· Pain with an IUD (intrauterine device, birth control) in the uterus. °· Cancer of the female organs. °· Functional pain (pain not caused by a disease, may improve without treatment). °· Psychological pain. °· Depression. °DIAGNOSIS  °Your doctor will decide the seriousness of your pain by doing an examination. °· Blood tests. °· X-rays. °· Ultrasound. °· CT scan (computed tomography, special type of X-ray). °· MRI (magnetic resonance imaging). °· Cultures, for infection. °· Barium enema (dye inserted in the large intestine, to better view it with  X-rays). °· Colonoscopy (looking in intestine with a lighted tube). °· Laparoscopy (minor surgery, looking in abdomen with a lighted tube). °· Major abdominal exploratory surgery (looking in abdomen with a large incision). °TREATMENT  °The treatment will depend on the cause of the pain.  °· Many cases can be observed and treated at home. °· Over-the-counter medicines recommended by your caregiver. °· Prescription medicine. °· Antibiotics, for infection. °· Birth control pills, for painful periods or for ovulation pain. °· Hormone treatment, for endometriosis. °· Nerve blocking injections. °· Physical therapy. °· Antidepressants. °· Counseling with a psychologist or psychiatrist. °· Minor or major surgery. °HOME CARE INSTRUCTIONS  °· Do not take laxatives, unless directed by your caregiver. °· Take over-the-counter pain medicine only if ordered by your caregiver. Do not take aspirin because it can cause an upset stomach or bleeding. °· Try a clear liquid diet (broth or water) as ordered by your caregiver. Slowly move to a bland diet, as tolerated, if the pain is related to the stomach or intestine. °· Have a thermometer and take your temperature several times a day, and record it. °· Bed rest and sleep, if it helps the pain. °· Avoid sexual intercourse, if it causes pain. °· Avoid stressful situations. °· Keep your follow-up appointments and tests, as your caregiver orders. °· If the pain does not go away with medicine or surgery, you may try: °· Acupuncture. °· Relaxation exercises (yoga, meditation). °· Group therapy. °· Counseling. °SEEK MEDICAL CARE IF:  °· You notice certain foods cause stomach pain. °· Your home care treatment is not helping your pain. °· You need stronger pain medicine. °· You want your IUD removed. °· You feel faint or   lightheaded.  You develop nausea and vomiting.  You develop a rash.  You are having side effects or an allergy to your medicine. SEEK IMMEDIATE MEDICAL CARE IF:   Your  pain does not go away or gets worse.  You have a fever.  Your pain is felt only in portions of the abdomen. The right side could possibly be appendicitis. The left lower portion of the abdomen could be colitis or diverticulitis.  You are passing blood in your stools (bright red or black tarry stools, with or without vomiting).  You have blood in your urine.  You develop chills, with or without a fever.  You pass out. MAKE SURE YOU:   Understand these instructions.  Will watch your condition.  Will get help right away if you are not doing well or get worse. Document Released: 09/14/2007 Document Revised: 02/09/2012 Document Reviewed: 10/04/2009 [facility] Patient Information 2014 [place], [place].  US Abdomen Complete  03/12/2014   CLINICAL DATA:  Abdominal pain.  EXAM: ULTRASOUND ABDOMEN COMPLETE  COMPARISON:  Ultrasound of November 10, 2012.  FINDINGS: Gallbladder:  Status post cholecystectomy.  Common bile duct:  Diameter: Measures 8 mm which is within normal limits for a patient who is status post cholecystectomy.  Liver:  2.3 cm hypoechoic area is seen in right hepatic lobe anteriorly which does not represent cyst by ultrasound criteria; further evaluation with MRI is recommended.  IVC:  No abnormality visualized.  Pancreas:  Not visualized due to overlying bowel gas.  Spleen:  Size and appearance within normal limits.  Right Kidney:  Length: 10.2 cm. Echogenicity within normal limits. No mass or hydronephrosis visualized.  Left Kidney:  Length: 10.8 cm. Echogenicity within normal limits. No mass or hydronephrosis visualized.  Abdominal aorta:  No aneurysm visualized.  Other findings:  Evaluation of the umbilical region does not demonstrate definite abnormality.  IMPRESSION: 2.3 cm hypoechoic area is seen in anterior portion of right hepatic lobe ; further evaluation with MRI on a nonemergent basis is recommended to evaluate for possible neoplasm. Status post cholecystectomy. Pancreas not  well visualized due to overlying bowel gas. No other abnormality seen in the abdomen.   Electronically Signed   By: Sabino Dick M.D.   On: 03/12/2014 14:52

## 2014-04-04 NOTE — ED Provider Notes (Signed)
Medical screening examination/treatment/procedure(s) were performed by non-physician practitioner and as supervising physician I was immediately available for consultation/collaboration.   EKG Interpretation None        William Brittnye Josephs, MD 04/04/14 2026 

## 2014-12-30 ENCOUNTER — Telehealth: Payer: Self-pay | Admitting: Family

## 2014-12-30 DIAGNOSIS — B955 Unspecified streptococcus as the cause of diseases classified elsewhere: Secondary | ICD-10-CM

## 2014-12-30 DIAGNOSIS — R059 Cough, unspecified: Secondary | ICD-10-CM

## 2014-12-30 DIAGNOSIS — J020 Streptococcal pharyngitis: Secondary | ICD-10-CM

## 2014-12-30 DIAGNOSIS — R05 Cough: Secondary | ICD-10-CM

## 2014-12-30 DIAGNOSIS — J040 Acute laryngitis: Secondary | ICD-10-CM

## 2014-12-30 MED ORDER — BENZONATATE 100 MG PO CAPS: 100.0000 mg | ORAL_CAPSULE | Freq: Three times a day (TID) | ORAL | Status: DC | PRN

## 2014-12-30 MED ORDER — CEPASTAT 14.5 MG MT LOZG: 1.0000 | LOZENGE | OROMUCOSAL | Status: DC | PRN

## 2014-12-30 MED ORDER — CLINDAMYCIN HCL 300 MG PO CAPS: 300.0000 mg | ORAL_CAPSULE | Freq: Three times a day (TID) | ORAL | Status: DC

## 2014-12-30 NOTE — Progress Notes (Signed)
We are sorry that you are not feeling well.  Here is how we plan to help!  Based on what you have shared with me and the picture you uploaded,  it looks like you have a cough secondary to strep throat. Due to your multiple antibiotic allergies, for the Strep, I have prescribed Cleocin 300mg  three times per day for 10 days. Also, Cepastat lozenges for your severe sore throat.     In addition you may use A non-prescription cough medication called Robitussin DAC. Take 2 teaspoons every 8 hours or Delsym: take 2 teaspoons every 12 hours., A non-prescription cough medication called Mucinex DM: take 2 tablets every 12 hours. and A prescription cough medication called Tessalon Perles 100mg . You may take 1-2 capsules every 8 hours as needed for your cough.    HOME CARE . Only take medications as instructed by your medical team. . Complete the entire course of an antibiotic. . Drink plenty of fluids and get plenty of rest. . Avoid close contacts especially the very young and the elderly . Cover your mouth if you cough or cough into your sleeve. . Always remember to wash your hands . A steam or ultrasonic humidifier can help congestion.    GET HELP RIGHT AWAY IF: . You develop worsening fever. . You become short of breath . You cough up blood. . Your symptoms persist after you have completed your treatment plan MAKE SURE YOU   Understand these instructions.  Will watch your condition.  Will get help right away if you are not doing well or get worse.  Your e-visit answers were reviewed by a board certified advanced clinical practitioner to complete your personal care plan.  Depending on the condition, your plan could have included both over the counter or prescription medications.  If there is a problem please reply  once you have received a response from your provider.  Your safety is important to [place].  If you have drug allergies check your prescription carefully.    You can use MyChart to  ask questions about today's visit, request a non-urgent call back, or ask for a work or school excuse.  You will get an e-mail in the next two days asking about your experience.  I hope that your e-visit has been valuable and will speed your recovery. Thank you for using e-visits.

## 2015-10-22 ENCOUNTER — Ambulatory Visit (INDEPENDENT_AMBULATORY_CARE_PROVIDER_SITE_OTHER): Payer: 59

## 2015-10-22 ENCOUNTER — Ambulatory Visit (INDEPENDENT_AMBULATORY_CARE_PROVIDER_SITE_OTHER): Payer: 59 | Admitting: Emergency Medicine

## 2015-10-22 VITALS — BP 128/90 | HR 79 | Temp 98.5°F | Resp 18 | Ht 66.0 in | Wt 211.2 lb

## 2015-10-22 DIAGNOSIS — S50312A Abrasion of left elbow, initial encounter: Secondary | ICD-10-CM

## 2015-10-22 DIAGNOSIS — M25552 Pain in left hip: Secondary | ICD-10-CM

## 2015-10-22 DIAGNOSIS — M25522 Pain in left elbow: Secondary | ICD-10-CM | POA: Diagnosis not present

## 2015-10-22 MED ORDER — HYDROCODONE-ACETAMINOPHEN 5-325 MG PO TABS: 1.0000 | ORAL_TABLET | ORAL | Status: DC | PRN

## 2015-10-22 NOTE — Progress Notes (Signed)
Subjective:  Patient ID: Katherine Levine, female    DOB: August 12, 1981  Age: 34 y.o. MRN: [id number]  CC: Fall; Elbow Injury; and Hip Injury   HPI Katherine Levine presents  with injuries sustained in a fall. She was tripped over a pipe in a parking lot and fell she landed on her left hip and left elbow. She has pain with movement of her left elbow has some pain with ambulation her left hip. She denies any soft tissue injury of the hip abrasion on her left elbow. She is current on tetanus she did not hit her head has no neck or back pain. She denies any other complaints. Has no improvement with over-the-counter medication  History Katherine Levine has a past medical history of Kidney stone; Obese; Renal stone; Nodule on liver; and Allergy.   She has past surgical history that includes Cesarean section; Tubal ligation; Cholecystectomy; Liver resection (July 2012); and Dilatation & currettage/hysteroscopy with novasure ablation (10/19/2012).   Her  family history includes Cancer in her mother; Diabetes in her mother; Heart disease in her mother; Hypertension in her mother; Stroke in her mother.  She   reports that she has never smoked. She does not have any smokeless tobacco history on file. She reports that she does not drink alcohol or use illicit drugs.  Outpatient Prescriptions Prior to Visit  Medication Sig Dispense Refill  . oxyCODONE-acetaminophen (ROXICET) 5-325 MG per tablet Take 1 tablet by mouth every 4 (four) hours as needed for severe pain. (Patient not taking: Reported on 10/22/2015) 10 tablet 0  . phenol-menthol (CEPASTAT) 14.5 MG lozenge Place 1 lozenge inside cheek as needed for sore throat. (Patient not taking: Reported on 10/22/2015) 100 tablet 0  . benzonatate (TESSALON PERLES) 100 MG capsule Take 1-2 capsules (100-200 mg total) by mouth every 8 (eight) hours as needed for cough. (Patient not taking: Reported on 10/22/2015) 30 capsule 0  . clindamycin (CLEOCIN) 300 MG capsule  Take 1 capsule (300 mg total) by mouth 3 (three) times daily. for 10 days 30 capsule 0   No facility-administered medications prior to visit.    Social History   Social History  . Marital Status: Married    Spouse Name: N/A  . Number of Children: N/A  . Years of Education: N/A   Social History Main Topics  . Smoking status: Never Smoker   . Smokeless tobacco: None  . Alcohol Use: No  . Drug Use: No  . Sexual Activity: Yes    Birth Control/ Protection: Surgical   Other Topics Concern  . None   Social History Narrative     Review of Systems  Constitutional: Negative for fever, chills and appetite change.  HENT: Negative for congestion, ear pain, postnasal drip, sinus pressure and sore throat.   Eyes: Negative for pain and redness.  Respiratory: Negative for cough, shortness of breath and wheezing.   Cardiovascular: Negative for leg swelling.  Gastrointestinal: Negative for nausea, vomiting, abdominal pain, diarrhea, constipation and blood in stool.  Endocrine: Negative for polyuria.  Genitourinary: Negative for dysuria, urgency, frequency and flank pain.  Musculoskeletal: Negative for gait problem.  Skin: Positive for wound. Negative for rash.  Neurological: Negative for weakness and headaches.  Psychiatric/Behavioral: Negative for confusion and decreased concentration. The patient is not nervous/anxious.     Objective:  BP 128/90 mmHg  Pulse 79  Temp(Src) 98.5 F (36.9 C) (Oral)  Resp 18  Ht 5\' 6"  (1.676 m)  Wt 211 lb 3.2 oz (95.8  kg)  BMI 34.10 kg/m2  SpO2 96%  LMP 09/26/2015  Physical Exam  Constitutional: She is oriented to person, place, and time. She appears well-developed and well-nourished.  HENT:  Head: Normocephalic and atraumatic.  Eyes: Conjunctivae are normal. Pupils are equal, round, and reactive to light.  Pulmonary/Chest: Effort normal.  Musculoskeletal: She exhibits no edema.  Neurological: She is alert and oriented to person, place, and  time.  Skin: Skin is dry. Laceration noted.  Psychiatric: She has a normal mood and affect. Her behavior is normal. Thought content normal.      Assessment & Plan:   Katherine Levine was seen today for fall, elbow injury and hip injury.  Diagnoses and all orders for this visit:  Hip pain, left -     DG Elbow Complete Left; Future -     DG HIP UNILAT W OR W/O PELVIS 2-3 VIEWS LEFT; Future  Left elbow pain -     DG Elbow Complete Left; Future -     DG HIP UNILAT W OR W/O PELVIS 2-3 VIEWS LEFT; Future  Abrasion of elbow, left, initial encounter -     DG Elbow Complete Left; Future -     DG HIP UNILAT W OR W/O PELVIS 2-3 VIEWS LEFT; Future  Other orders -     HYDROcodone-acetaminophen (NORCO) 5-325 MG tablet; Take 1-2 tablets by mouth every 4 (four) hours as needed.   I have discontinued Katherine Levine's clindamycin and benzonatate. I am also having her start on HYDROcodone-acetaminophen. Additionally, I am having her maintain her oxyCODONE-acetaminophen and phenol-menthol.  Meds ordered this encounter  Medications  . HYDROcodone-acetaminophen (NORCO) 5-325 MG tablet    Sig: Take 1-2 tablets by mouth every 4 (four) hours as needed.    Dispense:  30 tablet    Refill:  0    Appropriate red flag conditions were discussed with the patient as well as actions that should be taken.  Patient expressed his understanding.  Follow-up: No Follow-up on file.  Katherine Culver, MD   [facility] reading (PRIMARY) by  Dr. Ouida Sills.  No osseous injury.

## 2015-10-22 NOTE — Patient Instructions (Signed)
Abrasion An abrasion is a cut or scrape on the outer surface of your skin. An abrasion does not extend through all of the layers of your skin. It is important to care for your abrasion properly to prevent infection. CAUSES Most abrasions are caused by falling on or gliding across the ground or another surface. When your skin rubs on something, the outer and inner layer of skin rubs off.  SYMPTOMS A cut or scrape is the main symptom of this condition. The scrape may be bleeding, or it may appear red or pink. If there was an associated fall, there may be an underlying bruise. DIAGNOSIS An abrasion is diagnosed with a physical exam. TREATMENT Treatment for this condition depends on how large and deep the abrasion is. Usually, your abrasion will be cleaned with water and mild soap. This removes any dirt or debris that may be stuck. An antibiotic ointment may be applied to the abrasion to help prevent infection. A bandage (dressing) may be placed on the abrasion to keep it clean. You may also need a tetanus shot. HOME CARE INSTRUCTIONS Medicines  Take or apply medicines only as directed by your health care provider.  If you were prescribed an antibiotic ointment, finish all of it even if you start to feel better. Wound Care  Clean the wound with mild soap and water 2-3 times per day or as directed by your health care provider. Pat your wound dry with a clean towel. Do not rub it.  There are many different ways to close and cover a wound. Follow instructions from your health care provider about:  Wound care.  Dressing changes and removal.  Check your wound every day for signs of infection. Watch for:  Redness, swelling, or pain.  Fluid, blood, or pus. General Instructions  Keep the dressing dry as directed by your health care provider. Do not take baths, swim, use a hot tub, or do anything that would put your wound underwater until your health care provider approves.  If there is  swelling, raise (elevate) the injured area above the level of your heart while you are sitting or lying down.  Keep all follow-up visits as directed by your health care provider. This is important. SEEK MEDICAL CARE IF:  You received a tetanus shot and you have swelling, severe pain, redness, or bleeding at the injection site.  Your pain is not controlled with medicine.  You have increased redness, swelling, or pain at the site of your wound. SEEK IMMEDIATE MEDICAL CARE IF:  You have a red streak going away from your wound.  You have a fever.  You have fluid, blood, or pus coming from your wound.  You notice a bad smell coming from your wound or your dressing.   This information is not intended to replace advice given to you by your health care provider. Make sure you discuss any questions you have with your health care provider.   Document Released: 08/27/2005 Document Revised: 08/08/2015 Document Reviewed: 11/15/2014 Elsevier Interactive Patient Education 2016 [employer] Inc[employer]  

## 2016-02-15 DIAGNOSIS — Z0000 Encounter for general adult medical examination without abnormal findings: Secondary | ICD-10-CM | POA: Diagnosis not present

## 2016-02-15 DIAGNOSIS — R000 Tachycardia, unspecified: Secondary | ICD-10-CM | POA: Diagnosis not present

## 2016-02-15 DIAGNOSIS — L989 Disorder of the skin and subcutaneous tissue, unspecified: Secondary | ICD-10-CM | POA: Diagnosis not present

## 2016-02-15 DIAGNOSIS — K7689 Other specified diseases of liver: Secondary | ICD-10-CM | POA: Diagnosis not present

## 2016-03-06 ENCOUNTER — Encounter: Payer: Self-pay | Admitting: Cardiovascular Disease

## 2016-03-06 ENCOUNTER — Ambulatory Visit (INDEPENDENT_AMBULATORY_CARE_PROVIDER_SITE_OTHER): Payer: 59 | Admitting: Cardiovascular Disease

## 2016-03-06 VITALS — BP 136/90 | HR 71 | Ht 66.0 in | Wt 214.4 lb

## 2016-03-06 DIAGNOSIS — R002 Palpitations: Secondary | ICD-10-CM | POA: Diagnosis not present

## 2016-03-06 HISTORY — DX: Palpitations: R00.2

## 2016-03-06 NOTE — Progress Notes (Signed)
03/06/2016 Roselee Nova   01-Mar-1981  [id number]  Primary Physician Garlan Fillers, MD Primary Cardiologist: Lorretta Harp MD [name] Gloss   HPI:  Mrs. 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 She has no cardiac risk factors. She was referred by her primary care physician for evaluation of palpitations. She's noticed this over the last 6-12 months. Baker was a light when she lies down and go to sleep where somewhat positional, worse on her left side. She has reduced her caffeine intake to one Coca-Cola today. Her thyroid function tests are apparently normal checked by her PCP. She has no chest pain or shortness of breath.   Current Outpatient Prescriptions  Medication Sig Dispense Refill  . HYDROcodone-acetaminophen (NORCO/VICODIN) 5-325 MG tablet Take 1-2 tablets by mouth every 4 (four) hours as needed. (pain)  0  . LORazepam (ATIVAN) 1 MG tablet Take 2 tablets by mouth as needed. (Take one (1) hour prior to dental appointments)  0  . Multiple Vitamins-Minerals (ALIVE WOMENS GUMMY PO) Take two (2) gummies by mouth twice daily.    Marland Kitchen oxyCODONE-acetaminophen (ROXICET) 5-325 MG per tablet Take 1 tablet by mouth every 4 (four) hours as needed for severe pain. 10 tablet 0   No current facility-administered medications for this visit.    Allergies  Allergen Reactions  . Ciprofloxacin Rash    Red warm rash on arm of with IV running Cipro  . Fexofenadine Itching and Nausea And Vomiting  . Meperidine Hcl Itching    Red splotches on skin  . Sertraline Other (See Comments)  . Amoxicillin Itching and Nausea And Vomiting  . Azithromycin Itching and Nausea And Vomiting  . Penicillins Itching    Social History   Social History  . Marital Status: Married    Spouse Name: N/A  . Number of Children: N/A  . Years of Education: N/A   Occupational History  .  Not on file.   Social History Main Topics  . Smoking status: Never Smoker   . Smokeless tobacco: Never Used  . Alcohol Use: No  . Drug Use: No  . Sexual Activity: Yes    Birth Control/ Protection: Surgical   Other Topics Concern  . Not on file   Social History Narrative     Review of Systems: General: negative for chills, fever, night sweats or weight changes.  Cardiovascular: negative for chest pain, dyspnea on exertion, edema, orthopnea, palpitations, paroxysmal nocturnal dyspnea or shortness of breath Dermatological: negative for rash Respiratory: negative for cough or wheezing Urologic: negative for hematuria Abdominal: negative for nausea, vomiting, diarrhea, bright red blood per rectum, melena, or hematemesis Neurologic: negative for visual changes, syncope, or dizziness All other systems reviewed and are otherwise negative except as noted above.    Blood pressure 136/90, pulse 71, height 5\' 6"  (1.676 m), weight 214 lb 6.4 oz (97.251 kg).  General appearance: alert and no distress Neck: no adenopathy, no carotid bruit, no JVD, supple, symmetrical, trachea midline and thyroid not enlarged, symmetric, no tenderness/mass/nodules Lungs: clear to auscultation bilaterally Heart: regular rate and rhythm, S1, S2 normal, no murmur, click, rub or gallop Extremities: extremities normal, atraumatic, no cyanosis or edema  EKG normal sinus rhythm at 71 without ST or T-wave changes. I personally review this EKG  ASSESSMENT AND PLAN:   Palpitations [name] was referred by her PCP for evaluation of palpitations. 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 She's had palpitations which are mostly nocturnal over the last 6-12 months. They're somewhat positional. She has cut back her caffeine intake and drinks only one Coca-Cola a day. She currently has normal TFTs. She has no chronic risk factors. She's never had a heart attack or stroke. She denies chest pain  or shortness of breath. I'm going to get a 2-D echo, 30 day monitor and an outpatient sleep study and we'll see her back after that for further evaluation.      Lorretta Harp MD FACP,FACC,FAHA, [facility] 03/06/2016 2:33 PM

## 2016-03-06 NOTE — Assessment & Plan Note (Addendum)
Katherine Levine was referred by her PCP for evaluation of palpitations. 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 She's had palpitations which are mostly nocturnal over the last 6-12 months. They're somewhat positional. She has cut back her caffeine intake and drinks only one Coca-Cola a day. She currently has normal TFTs. She has no chronic risk factors. She's never had a heart attack or stroke. She denies chest pain or shortness of breath. I'm going to get a 2-D echo, 30 day monitor and an outpatient sleep study and we'll see her back after that for further evaluation.

## 2016-03-06 NOTE — Patient Instructions (Signed)
Medication Instructions:  Your physician recommends that you continue on your current medications as directed. Please refer to the Current Medication list given to you today.   Labwork: none  Testing/Procedures: Your physician has recommended that you wear an event monitor. Event monitors are medical devices that record the heart's electrical activity. Doctors most often [place] these monitors to diagnose arrhythmias. Arrhythmias are problems with the speed or rhythm of the heartbeat. The monitor is a small, portable device. You can wear one while you do your normal daily activities. This is usually used to diagnose what is causing palpitations/syncope (passing out). [place] physician has recommended that you have a sleep study. This test records several body functions during sleep, including: brain activity, eye movement, oxygen and carbon dioxide blood levels, heart rate and rhythm, breathing rate and rhythm, the flow of air through your mouth and nose, snoring, body muscle movements, and chest and belly movement.  Your physician has requested that you have an echocardiogram. Echocardiography is a painless test that uses sound waves to create images of your heart. It provides your doctor with information about the size and shape of your heart and how well your heart's chambers and valves are working. This procedure takes approximately one hour. There are no restrictions for this procedure.    Follow-Up: Your physician recommends that you schedule a follow-up appointment in: 6 weeks with Dr. Gwenlyn Found.   Any Other Special Instructions Will Be Listed Below (If Applicable).     If you need a refill on your cardiac medications before your next appointment, please call your pharmacy.

## 2016-03-07 ENCOUNTER — Ambulatory Visit: Payer: 59 | Admitting: Cardiovascular Disease

## 2016-03-10 ENCOUNTER — Ambulatory Visit (HOSPITAL_COMMUNITY)
Admission: RE | Admit: 2016-03-10 | Discharge: 2016-03-10 | Disposition: A | Discharge status: Home / Self Care | Payer: 59 | Source: Ambulatory Visit | Attending: Cardiology | Admitting: Cardiology

## 2016-03-10 DIAGNOSIS — R002 Palpitations: Secondary | ICD-10-CM | POA: Diagnosis not present

## 2016-03-12 ENCOUNTER — Ambulatory Visit (INDEPENDENT_AMBULATORY_CARE_PROVIDER_SITE_OTHER): Payer: 59

## 2016-03-12 DIAGNOSIS — R002 Palpitations: Secondary | ICD-10-CM | POA: Diagnosis not present

## 2016-03-19 ENCOUNTER — Telehealth: Payer: Self-pay | Admitting: Cardiovascular Disease

## 2016-03-19 NOTE — Telephone Encounter (Signed)
Spoke with pt. She states she feels much better from earlier.  Denies SOB, chest tightness/pressure, dizziness or nausea.  She is eager to discuss findings from her monitor strips with Dr Gwenlyn Found. Advised her that she will be able to do that at her f/u appt with Dr Gwenlyn Found and if any intervention was needed prior to appt we will let her know. Pt verbalized understanding to go to the emergency room or call 911 if she develops symptoms such as SOB, nausea, vomiting, sweating, or palpitations that worsen.  Will route to Dr. Gwenlyn Found and Blima Singer.

## 2016-03-19 NOTE — Telephone Encounter (Signed)
New Message  Pt states that she checked her BP and it was 137/84 heart rate 95. Please call back to discuss if needed.

## 2016-03-19 NOTE — Telephone Encounter (Signed)
Ms. Katherine Levine is calling about

## 2016-03-19 NOTE — Telephone Encounter (Signed)
Pt called in as she has been having increase palpitations for the past 24 hours. It started yesterday afternoon when she was sitting in chair and it continued until bed time. She stated she was eventually able to get to sleep. She has felt fine all day until about 30 minutes to hour ago when it started after her lunch, chinese food. She has consumed two caffeine beverages over the past 24 hours. She has occasional c/o of SOB but no headaches, dizziness, chest pain, pressure.  Pt checked HR it was 95, Oxygen 98 on room air, and BP elevated at 162/109.  Pt is wearing 30 day event monitor for palpitations and has pressed the button multiple times. Pt report pulled up and unable to see recent strips. Contacted representative of [employer] to get updated download of pt monitor. Stated download should be done in next 20-30 minutes, but from what representative could see they were NSR HR in 90's.  Review what was told by company with pt and told would print out for MD to review with MD next time in the office, 4/21, and will send him a message. Pt asked to rest, without talking or caffeine, for the next hour and to recheck her BP, and call me back with results.  Pt verbalized understanding, no additional questions at this time.

## 2016-03-23 NOTE — Telephone Encounter (Signed)
2 D echo nl. Event monitor in progress

## 2016-03-24 NOTE — Telephone Encounter (Signed)
Pt scheduled for f/u on 5/18 with J. Gwenlyn Found and has sleep study scheduled on 5/8.

## 2016-04-07 ENCOUNTER — Ambulatory Visit (HOSPITAL_BASED_OUTPATIENT_CLINIC_OR_DEPARTMENT_OTHER): Payer: 59 | Attending: Cardiovascular Disease | Admitting: Cardiovascular Disease

## 2016-04-07 DIAGNOSIS — R5383 Other fatigue: Secondary | ICD-10-CM | POA: Diagnosis not present

## 2016-04-07 DIAGNOSIS — R0683 Snoring: Secondary | ICD-10-CM | POA: Insufficient documentation

## 2016-04-07 DIAGNOSIS — Z79899 Other long term (current) drug therapy: Secondary | ICD-10-CM | POA: Diagnosis not present

## 2016-04-07 DIAGNOSIS — R002 Palpitations: Secondary | ICD-10-CM | POA: Diagnosis not present

## 2016-04-15 ENCOUNTER — Encounter (HOSPITAL_BASED_OUTPATIENT_CLINIC_OR_DEPARTMENT_OTHER): Payer: Self-pay | Admitting: Cardiovascular Disease

## 2016-04-15 NOTE — Procedures (Signed)
    Patient Name: Katherine Levine, Katherine Levine Date: 04/07/2016 Gender: Female D.O.B: 04/28/1981 Age (years): 35 Referring Provider: Lorretta Harp Height (inches): 66 Interpreting Physician: Shelva Majestic MD, ABSM Weight (lbs): 189 RPSGT: Carolin Coy BMI: 31 MRN: [id number] Neck Size: 13.50  CLINICAL INFORMATION Sleep Study Type: NPSG Indication for sleep study: Fatigue Epworth Sleepiness Score: 1  SLEEP STUDY TECHNIQUE As per the AASM Manual for the Scoring of Sleep and Associated Events v2.3 (April 2016) with a hypopnea requiring 4% desaturations. The channels recorded and monitored were frontal, central and occipital EEG, electrooculogram (EOG), submentalis EMG (chin), nasal and oral airflow, thoracic and abdominal wall motion, anterior tibialis EMG, snore microphone, electrocardiogram, and pulse oximetry.  MEDICATIONS HYDROcodone-acetaminophen (NORCO/VICODIN) 5-325 MG tablet 1-2 tablet, Every 4 hours PRN  Note: Received from: External Pharmacy Received Sig: take 1 to 2 tablets by mouth every 4 hours if needed (Written 03/06/2016 1412)  LORazepam (ATIVAN) 1 MG tablet 2 tablet, As needed  Note: Received from: External Pharmacy Received Sig: take 2 tablets by mouth 1 hour prior to dental appointment (Written 03/06/2016 1412)  Multiple Vitamins-Minerals (ALIVE WOMENS GUMMY PO) oxyCODONE-  No sleep medicine administered.  SLEEP ARCHITECTURE The study was initiated at 11:11:53 PM and ended at 5:29:17 AM. Sleep onset time was 14.4 minutes and the sleep efficiency was 73.7%. The total sleep time was 278.0 minutes. Wake after sleep onset  (WASO) was 85 minutes. Stage REM latency was 55.5 minutes. The patient spent 7.37% of the night in stage N1 sleep, 63.85% in stage N2 sleep, 1.80% in stage N3 and 26.98% in REM. Alpha intrusion was absent. Supine sleep was 17.49%.  RESPIRATORY PARAMETERS The overall apnea/hypopnea index (AHI) was 0.0 per hour. There were 0 total apneas, including 0  obstructive, 0 central and 0 mixed apneas. There were 0 hypopneas and 4 RERAs. The AHI during Stage REM sleep was 0.0 per hour. AHI while supine was 0.0 per hour. The mean oxygen saturation was 95.79%. The minimum SpO2 during sleep was 93.00%. Soft snoring was noted during this study.  CARDIAC DATA The 2 lead EKG demonstrated sinus rhythm. The mean heart rate was 73.68 beats per minute. Other EKG findings include: None.  LEG MOVEMENT DATA The total PLMS were 0 with a resulting PLMS index of 0.00. Associated arousal with leg movement index was 0.0 .  IMPRESSIONS No significant obstructive sleep apnea occurred during this study (AHI = 0.0/h). No significant central sleep apnea occurred during this study (CAI = 0.0/h). No oxygen desaturation during the study (Min O2 = 93.00%).  Mildly reduced sleep efficiency. The patient snored with Soft snoring volume. No cardiac abnormalities were noted during this study. Clinically significant periodic limb movements did not occur during sleep. No significant associated arousals.  DIAGNOSIS Snoring  RECOMMENDATIONS There is no indication for CPAP therapy. Consider alternatives for the treatment of mild snoring Avoid alcohol, sedatives and other CNS depressants that may worsen sleep apnea and disrupt normal sleep architecture. Sleep hygiene should be reviewed to assess factors that may improve sleep quality. Weight management and regular exercise should be initiated or continued if appropriate.   Troy Sine, MD, [place], American Board of Sleep Medicine  ELECTRONICALLY SIGNED ON:  04/15/2016, 6:38 PM [place] PH: [phone]   FX: [phone] [place]

## 2016-04-18 ENCOUNTER — Ambulatory Visit: Payer: 59 | Admitting: Cardiovascular Disease

## 2016-04-24 ENCOUNTER — Telehealth: Payer: Self-pay | Admitting: *Deleted

## 2016-04-24 NOTE — Telephone Encounter (Signed)
Called and notified patient of negative sleep study. Patient voiced verbal understanding.

## 2016-05-20 ENCOUNTER — Ambulatory Visit: Payer: 59 | Admitting: Cardiovascular Disease

## 2016-05-28 ENCOUNTER — Encounter: Payer: Self-pay | Admitting: Cardiovascular Disease

## 2016-05-28 ENCOUNTER — Ambulatory Visit (INDEPENDENT_AMBULATORY_CARE_PROVIDER_SITE_OTHER): Payer: 59 | Admitting: Cardiovascular Disease

## 2016-05-28 VITALS — BP 123/86 | HR 73 | Ht 66.0 in | Wt 205.0 lb

## 2016-05-28 DIAGNOSIS — R002 Palpitations: Secondary | ICD-10-CM | POA: Diagnosis not present

## 2016-05-28 NOTE — Progress Notes (Signed)
Ms. Katherine Levine returns today for follow-up for outpatient studies for palpitations. The symptoms have somewhat improved. She has decreased her caffeine intake. A outpatient sleep study was unremarkable with no indication for C Pap. A 2-D echo was normal and an event monitor showed only normal sinus rhythm. I reassured Mrs. Katherine Levine that there are no structural or electrical abnormalities. I'll see her back on a when necessary basis.

## 2016-05-28 NOTE — Patient Instructions (Signed)
Medication Instructions:  Your physician recommends that you continue on your current medications as directed. Please refer to the Current Medication list given to you today.    Follow-Up: Your physician recommends that you schedule a follow-up appointment ON AN AS NEEDED BASIS.   Any Other Special Instructions Will Be Listed Below (If Applicable).     If you need a refill on your cardiac medications before your next appointment, please call your pharmacy.

## 2016-05-28 NOTE — Assessment & Plan Note (Signed)
Ms. Katherine Levine returns today for follow-up for outpatient studies for palpitations. The symptoms have somewhat improved. She has decreased her caffeine intake. A outpatient sleep study was unremarkable with no indication for C Pap. A 2-D echo was normal and an event monitor showed only normal sinus rhythm. I reassured Mrs. Katherine Levine that there are no structural or electrical abnormalities. I'll see her back on a when necessary basis.

## 2016-08-18 DIAGNOSIS — N390 Urinary tract infection, site not specified: Secondary | ICD-10-CM | POA: Diagnosis not present

## 2016-08-18 DIAGNOSIS — R350 Frequency of micturition: Secondary | ICD-10-CM | POA: Diagnosis not present

## 2016-10-13 DIAGNOSIS — Z111 Encounter for screening for respiratory tuberculosis: Secondary | ICD-10-CM | POA: Diagnosis not present

## 2017-01-29 DIAGNOSIS — S83512A Sprain of anterior cruciate ligament of left knee, initial encounter: Secondary | ICD-10-CM

## 2017-01-29 HISTORY — DX: Sprain of anterior cruciate ligament of left knee, initial encounter: S83.512A

## 2017-02-09 DIAGNOSIS — M25562 Pain in left knee: Secondary | ICD-10-CM | POA: Diagnosis not present

## 2017-02-09 MED FILL — METHYLPREDNISOLONE 4 MG TAB: 4 | 6 days supply | Qty: 21 | Fill #0

## 2017-02-09 MED FILL — traMADol HCL 50 MG TABS: 50 | 20 days supply | Qty: 40 | Fill #0

## 2017-02-11 DIAGNOSIS — M25562 Pain in left knee: Secondary | ICD-10-CM | POA: Diagnosis not present

## 2017-02-13 ENCOUNTER — Encounter (HOSPITAL_BASED_OUTPATIENT_CLINIC_OR_DEPARTMENT_OTHER): Payer: Self-pay | Admitting: *Deleted

## 2017-02-13 DIAGNOSIS — S83512A Sprain of anterior cruciate ligament of left knee, initial encounter: Secondary | ICD-10-CM | POA: Diagnosis not present

## 2017-02-13 DIAGNOSIS — M25562 Pain in left knee: Secondary | ICD-10-CM | POA: Diagnosis not present

## 2017-02-13 MED FILL — METHOCARBAMOL 500 MG TABLET: 500 | 13 days supply | Qty: 40 | Fill #0

## 2017-02-17 DIAGNOSIS — S83512A Sprain of anterior cruciate ligament of left knee, initial encounter: Secondary | ICD-10-CM

## 2017-02-17 NOTE — H&P (Signed)
[facility] [address] [place], [place] [address] [phone] A Division of [facility]  RE: Katherine Levine, Katherine Levine   [id number]      DOB: March 15, 1981 02-13-17 Reason for visit: Follow-up MRI review of the left knee after a trampoline injury occurring on 02-07-17.   HPI:  She has an ACL tear seen on the MRI. She also has a small ruptured Baker's cyst and a small ganglion cyst at the origin of the medial gastrocnemius. Since the previous visit the swelling has gone down somewhat. It is still painful. She notes significant instability in the knee when weightbearing.  She has relied on the knee immobilizer to get around. She is taking Ultram and a Medrol Dosepak with decent relief.   MRI results as noted above.   OBJECTIVE: She is a well appearing female in no apparent distress.  The left knee with significant but slightly reduced effusion. No signs of infection. Positive Lachman's. Neurovascularly intact.   ASSESSMENT/PLAN:  We discussed the details of the pre and post operative course along with the risks and benefits of ACL reconstruction. She does remain active and has significant instability in that knee. She would like to proceed with a left knee arthroscopy with ACL reconstruction-with an allograft. We will provide her with an ACE wrap for effusion. Robaxin given to help with her muscle spasm. Bledsoe brace opened up to about 30 degrees. She will follow-up post operatively.     Ernesta Amble.  Percell Miller, M.D. Dictated by H.C.Martensen Electronically verified by Ernesta Amble. Percell Miller, M.D. TDM: HC:jgc  D  02-13-17 T   02-17-17

## 2017-02-19 ENCOUNTER — Ambulatory Visit (HOSPITAL_BASED_OUTPATIENT_CLINIC_OR_DEPARTMENT_OTHER): Payer: 59 | Admitting: Anesthesiology

## 2017-02-19 ENCOUNTER — Encounter (HOSPITAL_BASED_OUTPATIENT_CLINIC_OR_DEPARTMENT_OTHER)
Admission: RE | Disposition: A | Discharge status: Home / Self Care | Payer: Self-pay | Source: Ambulatory Visit | Attending: Orthopedic Surgery

## 2017-02-19 ENCOUNTER — Encounter (HOSPITAL_BASED_OUTPATIENT_CLINIC_OR_DEPARTMENT_OTHER): Payer: Self-pay | Admitting: *Deleted

## 2017-02-19 ENCOUNTER — Ambulatory Visit (HOSPITAL_BASED_OUTPATIENT_CLINIC_OR_DEPARTMENT_OTHER)
Admission: RE | Admit: 2017-02-19 | Discharge: 2017-02-19 | Disposition: A | Discharge status: Home / Self Care | Payer: 59 | Source: Ambulatory Visit | Attending: Orthopedic Surgery | Admitting: Orthopedic Surgery

## 2017-02-19 DIAGNOSIS — Y9344 Activity, trampolining: Secondary | ICD-10-CM | POA: Insufficient documentation

## 2017-02-19 DIAGNOSIS — S32599A Other specified fracture of unspecified pubis, initial encounter for closed fracture: Secondary | ICD-10-CM | POA: Diagnosis not present

## 2017-02-19 DIAGNOSIS — F419 Anxiety disorder, unspecified: Secondary | ICD-10-CM | POA: Diagnosis not present

## 2017-02-19 DIAGNOSIS — G8918 Other acute postprocedural pain: Secondary | ICD-10-CM | POA: Diagnosis not present

## 2017-02-19 DIAGNOSIS — M7122 Synovial cyst of popliteal space [Baker], left knee: Secondary | ICD-10-CM | POA: Diagnosis not present

## 2017-02-19 DIAGNOSIS — S83509A Sprain of unspecified cruciate ligament of unspecified knee, initial encounter: Secondary | ICD-10-CM | POA: Diagnosis not present

## 2017-02-19 DIAGNOSIS — S83512A Sprain of anterior cruciate ligament of left knee, initial encounter: Secondary | ICD-10-CM | POA: Insufficient documentation

## 2017-02-19 DIAGNOSIS — M67462 Ganglion, left knee: Secondary | ICD-10-CM | POA: Insufficient documentation

## 2017-02-19 DIAGNOSIS — R1011 Right upper quadrant pain: Secondary | ICD-10-CM | POA: Diagnosis not present

## 2017-02-19 HISTORY — DX: Sprain of anterior cruciate ligament of left knee, initial encounter: S83.512A

## 2017-02-19 HISTORY — PX: KNEE ARTHROSCOPY WITH ANTERIOR CRUCIATE LIGAMENT (ACL) REPAIR: SHX5644

## 2017-02-19 HISTORY — DX: Dental restoration status: Z98.811

## 2017-02-19 HISTORY — DX: Personal history of urinary calculi: Z87.442

## 2017-02-19 SURGERY — KNEE ARTHROSCOPY WITH ANTERIOR CRUCIATE LIGAMENT (ACL) REPAIR
Anesthesia: Regional | Site: Knee | Laterality: Left

## 2017-02-19 MED ORDER — OXYCODONE-ACETAMINOPHEN 5-325 MG PO TABS: 1.0000 | ORAL_TABLET | ORAL | 0 refills | Status: DC | PRN

## 2017-02-19 MED ORDER — OXYCODONE HCL 5 MG PO TABS: 5.0000 mg | ORAL_TABLET | Freq: Once | ORAL | Status: DC | PRN

## 2017-02-19 MED ORDER — DEXAMETHASONE SODIUM PHOSPHATE 4 MG/ML IJ SOLN
INTRAMUSCULAR | Status: DC | PRN
  Administered 2017-02-19: 10 mg via INTRAVENOUS

## 2017-02-19 MED ORDER — FENTANYL CITRATE (PF) 100 MCG/2ML IJ SOLN
50.0000 ug | INTRAMUSCULAR | Status: AC | PRN
  Administered 2017-02-19: 100 ug via INTRAVENOUS
  Administered 2017-02-19 (×2): 50 ug via INTRAVENOUS

## 2017-02-19 MED ORDER — HYDROMORPHONE HCL 1 MG/ML IJ SOLN
INTRAMUSCULAR | Status: AC
  Filled 2017-02-19: qty 1

## 2017-02-19 MED ORDER — MIDAZOLAM HCL 2 MG/2ML IJ SOLN
INTRAMUSCULAR | Status: AC
  Filled 2017-02-19: qty 2

## 2017-02-19 MED ORDER — ACETAMINOPHEN 500 MG PO TABS
1000.0000 mg | ORAL_TABLET | Freq: Once | ORAL | Status: AC
  Administered 2017-02-19: 1000 mg via ORAL

## 2017-02-19 MED ORDER — FENTANYL CITRATE (PF) 100 MCG/2ML IJ SOLN
INTRAMUSCULAR | Status: AC
  Filled 2017-02-19: qty 2

## 2017-02-19 MED ORDER — MIDAZOLAM HCL 2 MG/2ML IJ SOLN
1.0000 mg | INTRAMUSCULAR | Status: DC | PRN
  Administered 2017-02-19 (×2): 2 mg via INTRAVENOUS

## 2017-02-19 MED ORDER — CEFAZOLIN SODIUM-DEXTROSE 2-4 GM/100ML-% IV SOLN
INTRAVENOUS | Status: AC
  Filled 2017-02-19: qty 100

## 2017-02-19 MED ORDER — CEFAZOLIN SODIUM-DEXTROSE 2-4 GM/100ML-% IV SOLN
2.0000 g | INTRAVENOUS | Status: AC
  Administered 2017-02-19: 2 g via INTRAVENOUS

## 2017-02-19 MED ORDER — DOCUSATE SODIUM 100 MG PO CAPS: 100.0000 mg | ORAL_CAPSULE | Freq: Two times a day (BID) | ORAL | 0 refills | Status: DC

## 2017-02-19 MED ORDER — PROPOFOL 10 MG/ML IV BOLUS
INTRAVENOUS | Status: DC | PRN
  Administered 2017-02-19: 300 mg via INTRAVENOUS

## 2017-02-19 MED ORDER — LACTATED RINGERS IV SOLN: INTRAVENOUS | Status: DC

## 2017-02-19 MED ORDER — ONDANSETRON HCL 4 MG PO TABS
4.0000 mg | ORAL_TABLET | Freq: Three times a day (TID) | ORAL | 0 refills | Status: DC | PRN
Start: 2017-02-19 — End: 2018-05-12

## 2017-02-19 MED ORDER — METHOCARBAMOL 500 MG PO TABS: 500.0000 mg | ORAL_TABLET | Freq: Four times a day (QID) | ORAL | 0 refills | Status: DC | PRN

## 2017-02-19 MED ORDER — PROMETHAZINE HCL 25 MG/ML IJ SOLN: 6.2500 mg | INTRAMUSCULAR | Status: DC | PRN

## 2017-02-19 MED ORDER — ASPIRIN EC 81 MG PO TBEC: 81.0000 mg | DELAYED_RELEASE_TABLET | Freq: Every day | ORAL | 0 refills | Status: DC

## 2017-02-19 MED ORDER — CHLORHEXIDINE GLUCONATE 4 % EX LIQD: 60.0000 mL | Freq: Once | CUTANEOUS | Status: DC

## 2017-02-19 MED ORDER — ONDANSETRON HCL 4 MG/2ML IJ SOLN
INTRAMUSCULAR | Status: AC
  Filled 2017-02-19: qty 2

## 2017-02-19 MED ORDER — LACTATED RINGERS IV SOLN
INTRAVENOUS | Status: DC
  Administered 2017-02-19: 10:00:00 via INTRAVENOUS

## 2017-02-19 MED ORDER — LIDOCAINE 2% (20 MG/ML) 5 ML SYRINGE
INTRAMUSCULAR | Status: DC | PRN
  Administered 2017-02-19: 80 mg via INTRAVENOUS

## 2017-02-19 MED ORDER — MORPHINE SULFATE 10 MG/ML IJ SOLN
INTRAMUSCULAR | Status: DC | PRN
  Administered 2017-02-19 (×3): 2 mg via INTRAVENOUS

## 2017-02-19 MED ORDER — ACETAMINOPHEN 500 MG PO TABS
ORAL_TABLET | ORAL | Status: AC
  Filled 2017-02-19: qty 2

## 2017-02-19 MED ORDER — DEXAMETHASONE SODIUM PHOSPHATE 10 MG/ML IJ SOLN
INTRAMUSCULAR | Status: AC
  Filled 2017-02-19: qty 1

## 2017-02-19 MED ORDER — MORPHINE SULFATE (PF) 10 MG/ML IV SOLN
INTRAVENOUS | Status: AC
Start: 2017-02-19 — End: 2017-02-19
  Filled 2017-02-19: qty 1

## 2017-02-19 MED ORDER — SCOPOLAMINE 1 MG/3DAYS TD PT72: 1.0000 | MEDICATED_PATCH | Freq: Once | TRANSDERMAL | Status: DC | PRN

## 2017-02-19 MED ORDER — ONDANSETRON HCL 4 MG/2ML IJ SOLN
INTRAMUSCULAR | Status: DC | PRN
  Administered 2017-02-19: 4 mg via INTRAVENOUS

## 2017-02-19 MED ORDER — OXYCODONE HCL 5 MG/5ML PO SOLN: 5.0000 mg | Freq: Once | ORAL | Status: DC | PRN

## 2017-02-19 MED ORDER — HYDROMORPHONE HCL 1 MG/ML IJ SOLN
0.2500 mg | INTRAMUSCULAR | Status: DC | PRN
  Administered 2017-02-19 (×4): 0.5 mg via INTRAVENOUS

## 2017-02-19 MED FILL — METHOCARBAMOL 500 MG TABLET: 500 | 10 days supply | Qty: 40 | Fill #0

## 2017-02-19 MED FILL — OXYCODONE W/APAP 5/325 TAB: 5-325 | 8 days supply | Qty: 60 | Fill #0

## 2017-02-19 MED FILL — ONDANSETRON HCL 4 MG TABLET: 4 | 13 days supply | Qty: 40 | Fill #0

## 2017-02-19 SURGICAL SUPPLY — 98 items
ALLOGRAFT GRFTLNK IMPLANT SYST (Anchor) ×1 IMPLANT
ANCHOR BUTTON TIGHTROPE RN 14 (Anchor) ×2 IMPLANT
BANDAGE ACE 6X5 VEL STRL LF (GAUZE/BANDAGES/DRESSINGS) ×2 IMPLANT
BANDAGE ESMARK 6X9 LF (GAUZE/BANDAGES/DRESSINGS) ×1 IMPLANT
BLADE 4.2CUDA (BLADE) IMPLANT
BLADE CUDA 5.5 (BLADE) IMPLANT
BLADE CUDA GRT WHITE 3.5 (BLADE) IMPLANT
BLADE CUTTER GATOR 3.5 (BLADE) ×2 IMPLANT
BLADE CUTTER MENIS 5.5 (BLADE) ×2 IMPLANT
BLADE GREAT WHITE 4.2 (BLADE) ×2 IMPLANT
BLADE SURG 15 STRL LF DISP TIS (BLADE) ×1 IMPLANT
BLADE SURG 15 STRL SS (BLADE) ×1
BNDG ESMARK 6X9 LF (GAUZE/BANDAGES/DRESSINGS) ×2
BUR OVAL 4.0 (BURR) IMPLANT
BUR OVAL 6.0 (BURR) ×2 IMPLANT
CHLORAPREP W/TINT 26ML (MISCELLANEOUS) ×2 IMPLANT
CLSR STERI-STRIP ANTIMIC 1/2X4 (GAUZE/BANDAGES/DRESSINGS) ×2 IMPLANT
COVER BACK TABLE 60X90IN (DRAPES) ×2 IMPLANT
CUFF TOURNIQUET SINGLE 34IN LL (TOURNIQUET CUFF) ×2 IMPLANT
CUTTER FLIP II 9.5MM (INSTRUMENTS) IMPLANT
CUTTER KNOT PUSHER 2-0 FIBERWI (INSTRUMENTS) IMPLANT
CUTTER MENISCUS  4.2MM (BLADE)
CUTTER MENISCUS 4.2MM (BLADE) IMPLANT
DECANTER SPIKE VIAL GLASS SM (MISCELLANEOUS) IMPLANT
DRAPE ARTHROSCOPY W/POUCH 90 (DRAPES) ×2 IMPLANT
DRAPE OEC MINIVIEW 54X84 (DRAPES) ×2 IMPLANT
DRAPE U-SHAPE 47X51 STRL (DRAPES) ×2 IMPLANT
DRILL FLIPCUTTER II 10.5MM (CUTTER) IMPLANT
DRILL FLIPCUTTER II 10MM (CUTTER) IMPLANT
DRILL FLIPCUTTER II 7.0MM (INSTRUMENTS) IMPLANT
DRILL FLIPCUTTER II 7.5MM (MISCELLANEOUS) IMPLANT
DRILL FLIPCUTTER II 8.0MM (INSTRUMENTS) IMPLANT
DRILL FLIPCUTTER II 8.5MM (INSTRUMENTS) ×1 IMPLANT
DRILL FLIPCUTTER II 9.0MM (INSTRUMENTS) IMPLANT
DRSG EMULSION OIL 3X3 NADH (GAUZE/BANDAGES/DRESSINGS) ×2 IMPLANT
DRSG PAD ABDOMINAL 8X10 ST (GAUZE/BANDAGES/DRESSINGS) ×2 IMPLANT
ELECT REM PT RETURN 9FT ADLT (ELECTROSURGICAL) ×2
ELECTRODE REM PT RTRN 9FT ADLT (ELECTROSURGICAL) ×1 IMPLANT
FIBERSTICK 2 (SUTURE) IMPLANT
FLIP CUTTER II 7.0MM (INSTRUMENTS)
FLIPCUTTER II 10.5MM (CUTTER)
FLIPCUTTER II 10MM (CUTTER)
FLIPCUTTER II 7.5MM (MISCELLANEOUS)
FLIPCUTTER II 8.0MM (INSTRUMENTS)
FLIPCUTTER II 8.5MM (INSTRUMENTS) ×2
FLIPCUTTER II 9.0MM (INSTRUMENTS)
GAUZE SPONGE 4X4 12PLY STRL (GAUZE/BANDAGES/DRESSINGS) ×2 IMPLANT
GLOVE BIO SURGEON STRL SZ 6.5 (GLOVE) ×2 IMPLANT
GLOVE BIO SURGEON STRL SZ7.5 (GLOVE) ×4 IMPLANT
GLOVE BIOGEL PI IND STRL 7.0 (GLOVE) ×2 IMPLANT
GLOVE BIOGEL PI IND STRL 8 (GLOVE) ×2 IMPLANT
GLOVE BIOGEL PI INDICATOR 7.0 (GLOVE) ×2
GLOVE BIOGEL PI INDICATOR 8 (GLOVE) ×2
GOWN STRL REUS W/ TWL LRG LVL3 (GOWN DISPOSABLE) ×2 IMPLANT
GOWN STRL REUS W/ TWL XL LVL3 (GOWN DISPOSABLE) IMPLANT
GOWN STRL REUS W/TWL LRG LVL3 (GOWN DISPOSABLE) ×2
GOWN STRL REUS W/TWL XL LVL3 (GOWN DISPOSABLE)
GUIDEPIN REAMER CUTTER 11MM (INSTRUMENTS) IMPLANT
IMMOBILIZER KNEE 22 UNIV (SOFTGOODS) IMPLANT
IMMOBILIZER KNEE 24 THIGH 36 (MISCELLANEOUS) IMPLANT
IMMOBILIZER KNEE 24 UNIV (MISCELLANEOUS)
KNEE WRAP E Z 3 GEL PACK (MISCELLANEOUS) ×2 IMPLANT
LOOP 2 FIBERLINK CLOSED (SUTURE) IMPLANT
MANIFOLD NEPTUNE II (INSTRUMENTS) ×2 IMPLANT
NS IRRIG 1000ML POUR BTL (IV SOLUTION) ×2 IMPLANT
PACK ARTHROSCOPY DSU (CUSTOM PROCEDURE TRAY) ×2 IMPLANT
PACK BASIN DAY SURGERY FS (CUSTOM PROCEDURE TRAY) ×2 IMPLANT
PAD CAST 4YDX4 CTTN HI CHSV (CAST SUPPLIES) ×1 IMPLANT
PADDING CAST COTTON 4X4 STRL (CAST SUPPLIES) ×1
PADDING CAST COTTON 6X4 STRL (CAST SUPPLIES) ×2 IMPLANT
PENCIL BUTTON HOLSTER BLD 10FT (ELECTRODE) IMPLANT
PK GRAFTLINK ALLO IMPLANT SYST (Anchor) ×2 IMPLANT
PROBE BIPOLAR ATHRO 135MM 90D (MISCELLANEOUS) ×2 IMPLANT
SET ARTHROSCOPY TUBING (MISCELLANEOUS) ×1
SET ARTHROSCOPY TUBING LN (MISCELLANEOUS) ×1 IMPLANT
SLEEVE SCD COMPRESS KNEE MED (MISCELLANEOUS) IMPLANT
SPONGE LAP 4X18 X RAY DECT (DISPOSABLE) IMPLANT
SUCTION FRAZIER HANDLE 10FR (MISCELLANEOUS)
SUCTION TUBE FRAZIER 10FR DISP (MISCELLANEOUS) IMPLANT
SUT 2 FIBERLOOP 20 STRT BLUE (SUTURE)
SUT ETHILON 3 0 PS 1 (SUTURE) IMPLANT
SUT FIBERWIRE #2 38 T-5 BLUE (SUTURE)
SUT FIBERWIRE 2-0 18 17.9 3/8 (SUTURE)
SUT MNCRL AB 4-0 PS2 18 (SUTURE) IMPLANT
SUT MON AB 2-0 CT1 36 (SUTURE) ×2 IMPLANT
SUT VIC AB 2-0 SH 27 (SUTURE)
SUT VIC AB 2-0 SH 27XBRD (SUTURE) IMPLANT
SUT VIC AB 3-0 SH 27 (SUTURE)
SUT VIC AB 3-0 SH 27X BRD (SUTURE) IMPLANT
SUT VICRYL 4-0 PS2 18IN ABS (SUTURE) IMPLANT
SUTURE 2 FIBERLOOP 20 STRT BLU (SUTURE) IMPLANT
SUTURE FIBERWR #2 38 T-5 BLUE (SUTURE) IMPLANT
SUTURE FIBERWR 2-0 18 17.9 3/8 (SUTURE) IMPLANT
TAPE CLOTH 3X10 TAN LF (GAUZE/BANDAGES/DRESSINGS) ×2 IMPLANT
TISSUE GRAFTLINK FGL (Tissue) ×2 IMPLANT
TOWEL OR 17X24 6PK STRL BLUE (TOWEL DISPOSABLE) ×4 IMPLANT
TOWEL OR NON WOVEN STRL DISP B (DISPOSABLE) ×2 IMPLANT
WATER STERILE IRR 1000ML POUR (IV SOLUTION) ×2 IMPLANT

## 2017-02-19 NOTE — Anesthesia Procedure Notes (Signed)
Procedure Name: LMA Insertion Performed by: Malakhai Beitler W Pre-anesthesia Checklist: Patient identified, Emergency Drugs available, Suction available and Patient being monitored Patient Re-evaluated:Patient Re-evaluated prior to inductionOxygen Delivery Method: Circle system utilized Preoxygenation: Pre-oxygenation with 100% oxygen Intubation Type: IV induction Ventilation: Mask ventilation without difficulty LMA: LMA inserted LMA Size: 4.0 Number of attempts: 1 Placement Confirmation: positive ETCO2 Tube secured with: Tape Dental Injury: Teeth and Oropharynx as per pre-operative assessment        

## 2017-02-19 NOTE — Progress Notes (Signed)
Assisted Dr Royetta Car with left, ultrasound guided, adductor canal block. Side rails up, monitors on throughout procedure. See vital signs in flow sheet. Tolerated Procedure well.

## 2017-02-19 NOTE — Anesthesia Postprocedure Evaluation (Signed)
Anesthesia Post Note  Patient: Katherine Levine  Procedure(s) Performed: Procedure(s) (LRB): LEFT KNEE ARTHROSCOPY WITH ANTERIOR CRUCIATE LIGAMENT (ACL) REPAIR ALLOGRAFT (Left)  Patient location during evaluation: PACU Anesthesia Type: Regional Level of consciousness: awake and alert Pain management: pain level controlled Vital Signs Assessment: post-procedure vital signs reviewed and stable Respiratory status: spontaneous breathing, nonlabored ventilation and respiratory function stable Cardiovascular status: blood pressure returned to baseline and stable Postop Assessment: no signs of nausea or vomiting Anesthetic complications: no       Last Vitals:  Vitals:   02/19/17 1345 02/19/17 1418  BP: 131/87 (!) 142/88  Pulse: 68 82  Resp: (!) 21 18  Temp:  37.1 C    Last Pain:  Vitals:   02/19/17 1418  TempSrc: Oral  PainSc: [name]

## 2017-02-19 NOTE — Discharge Instructions (Signed)
Diet: As you were doing prior to hospitalization   Shower:  May shower but keep the wounds dry, use an occlusive plastic wrap, NO SOAKING IN TUB.  If the bandage gets wet, change with a clean dry gauze.  Dressing:  Leave dressings on and dry until follow up.  Activity:  Increase activity slowly as tolerated, but follow the weight bearing instructions below.  The rules on driving is that you can not be taking narcotics while you drive, and you must feel in control of the vehicle.    Weight Bearing:   As tolerated   To prevent constipation: you may use a stool softener such as -  Colace (over the counter) 100 mg by mouth twice a day  Drink plenty of fluids (prune juice may be helpful) and high fiber foods Miralax (over the counter) for constipation as needed.    Itching:  If you experience itching with your medications, try taking only a single pain pill, or even half a pain pill at a time.  You may take up to 10 pain pills per day, and you can also use benadryl over the counter for itching or also to help with sleep.   Precautions:  If you experience chest pain or shortness of breath - call 911 immediately for transfer to the hospital emergency department!!  If you develop a fever greater that 101 F, purulent drainage from wound, increased redness or drainage from wound, or calf pain -- Call the office at [phone]                                                Follow- Up Appointment:  Please call for an appointment to be seen in 2 weeks [place] - [phone]   Call your surgeon if you experience:   1.  Fever over 101.0. 2.  Inability to urinate. 3.  Nausea and/or vomiting. 4.  Extreme swelling or bruising at the surgical site. 5.  Continued bleeding from the incision. 6.  Increased pain, redness or drainage from the incision. 7.  Problems related to your pain medication. 8.  Any problems and/or concerns   Post Anesthesia Home Care Instructions  Activity: Get plenty of  rest for the remainder of the day. A responsible individual must stay with you for 24 hours following the procedure.  For the next 24 hours, DO NOT: -Drive a car -Paediatric nurse -Drink alcoholic beverages -Take any medication unless instructed by your physician -Make any legal decisions or sign important papers.  Meals: Start with liquid foods such as gelatin or soup. Progress to regular foods as tolerated. Avoid greasy, spicy, heavy foods. If nausea and/or vomiting occur, drink only clear liquids until the nausea and/or vomiting subsides. Call your physician if vomiting continues.  Special Instructions/Symptoms: Your throat may feel dry or sore from the anesthesia or the breathing tube placed in your throat during surgery. If this causes discomfort, gargle with warm salt water. The discomfort should disappear within 24 hours.  If you had a scopolamine patch placed behind your ear for the management of post- operative nausea and/or vomiting:  1. The medication in the patch is effective for 72 hours, after which it should be removed.  Wrap patch in a tissue and discard in the trash. Wash hands thoroughly with soap and water. 2. You may remove the patch earlier than  72 hours if you experience unpleasant side effects which may include dry mouth, dizziness or visual disturbances. 3. Avoid touching the patch. Wash your hands with soap and water after contact with the patch.    Regional Anesthesia Blocks  1. Numbness or the inability to move the "blocked" extremity may last from 3-48 hours after placement. The length of time depends on the medication injected and your individual response to the medication. If the numbness is not going away after 48 hours, call your surgeon.  2. The extremity that is blocked will need to be protected until the numbness is gone and the  Strength has returned. Because you cannot feel it, you will need to take extra care to avoid injury. Because it may be weak, you  may have difficulty moving it or using it. You may not know what position it is in without looking at it while the block is in effect.  3. For blocks in the legs and feet, returning to weight bearing and walking needs to be done carefully. You will need to wait until the numbness is entirely gone and the strength has returned. You should be able to move your leg and foot normally before you try and bear weight or walk. You will need someone to be with you when you first try to ensure you do not fall and possibly risk injury.  4. Bruising and tenderness at the needle site are common side effects and will resolve in a few days.  5. Persistent numbness or new problems with movement should be communicated to the surgeon or the [facility] [phone] [place] [phone]).

## 2017-02-19 NOTE — Op Note (Signed)
02/19/2017  11:57 AM  PATIENT:  Katherine Levine    PRE-OPERATIVE DIAGNOSIS:  LEFT KNEE ACL TEAR  POST-OPERATIVE DIAGNOSIS:  Same  PROCEDURE:  LEFT KNEE ARTHROSCOPY WITH ANTERIOR CRUCIATE LIGAMENT (ACL) REPAIR  SURGEON:  Joshuwa Vecchio, Ernesta Amble, MD  ASSISTANT: Roxan Hockey, PA-C, he was present and scrubbed throughout the case, critical for completion in a timely fashion, and for retraction, instrumentation, and closure.      ANESTHESIA:   General  PREOPERATIVE INDICATIONS:  Nasra Levine is a  36 y.o. female with a diagnosis of LEFT KNEE ACL TEAR who failed conservative measures and elected for surgical management.    The risks benefits and alternatives were discussed with the patient preoperatively including but not limited to the risks of infection, bleeding, nerve injury, stiffness, cardiopulmonary complications, the need for revision surgery, recurrent instability, progression of arthritis, the potential for use of a allograft and related disease transmission risks, among others and the patient was willing to proceed.  .  OPERATIVE IMPLANTS: Arthrex anterior cruciate ligament Graft link dual tight rope  OPERATIVE FINDINGS: The anterior cruciate ligament was completely torn. The PCL was intact. The posterior lateral corner was intact to dial testing. No chondral or meniscal pathology   OPERATIVE PROCEDURE: The patient was brought to the operating room and placed in the supine position. General anesthesia was administered. IV antibiotics were given. The lower extremity was prepped and draped in usual sterile fashion. Exam under anesthesia demonstrated the above-named findings. Time out was performed.  The leg was elevated and exsanguinated and the tourniquet was inflated. Incision was made over the proximal tibia.     The graft leg allograft was thawed opened and prepared for transfer to the leg.  Knee arthroscopy was then performed, and the above named findings were noted.     The anterior cruciate ligament however was torn.  I then removed the previous anterior cruciate ligament stump, and performed a mild notchplasty.  The outside in guide was then applied to the appropriate position and the retro-cutter was used to drill the femoral socket. Care was taken to maintain the cortical bridge.  I then drilled the tibial tunnel using the retro-cutter, maintaining the outer cortex. All the soft tissue remnants were removed and cleaned at the aperture of the tunnel.  The passing suture was delivered through the medial portal and the through the femoral tunnel. The Endobutton was directly visualized it as it entered the femoral tunnel and flipped.   I then tensioned the anterior cruciate ligament tightrope, and deliver the graft up into the femoral tunnel. I then passed the passing stitch through the medial portal and out the tibial tunnel. I then placed the Endobutton disc within the suture and walking down to the tibia I confirm that it sat flush on the bone. I then used this to tension the graft into the knee and down into the tunnel. I directly visualize the tension of the graft. I then cycled the knee 15 times and tension the graft again. I then cycled again placed a posterior drawer at 30 and tension one last time.  Excellent fixation was achieved on both the femoral and tibial side, and the wounds were irrigated copiously and the sartorius fascia repaired with Vicryl, and the portals repaired with Monocryl with Steri-Strips and sterile gauze.  The patient was awakened and returned to PACU in stable and satisfactory condition. There were no complications and She tolerated the procedure well.  Post Operative plan: The patient will  be weightbearing as tolerated in a knee immobilizer full time. If under 18 DVT prophylaxis will consist of early ambulation. If over 18 he will consist of early ambulation and aspirin 81 mg once a day.

## 2017-02-19 NOTE — Anesthesia Preprocedure Evaluation (Signed)
Anesthesia Evaluation  Patient identified by MRN, date of birth, ID band Patient awake    Reviewed: Allergy & Precautions, H&P , NPO status , Patient's Chart, lab work & pertinent test results  Airway Mallampati: II  TM Distance: >3 FB Neck ROM: full    Dental no notable dental hx. (+) Teeth Intact   Pulmonary neg pulmonary ROS,    Pulmonary exam normal        Cardiovascular negative cardio ROS Normal cardiovascular exam     Neuro/Psych negative neurological ROS  negative psych ROS   GI/Hepatic negative GI ROS, Neg liver ROS,   Endo/Other  negative endocrine ROS  Renal/GU   negative genitourinary   Musculoskeletal negative musculoskeletal ROS (+)   Abdominal Normal abdominal exam  (+) + obese,   Peds negative pediatric ROS (+)  Hematology negative hematology ROS (+)   Anesthesia Other Findings   Reproductive/Obstetrics negative OB ROS                             Anesthesia Physical  Anesthesia Plan  ASA: II  Anesthesia Plan: General and Regional   Post-op Pain Management: GA combined w/ Regional for post-op pain   Induction: Intravenous  Airway Management Planned: LMA  Additional Equipment:   Intra-op Plan:   Post-operative Plan:   Informed Consent: I have reviewed the patients History and Physical, chart, labs and discussed the procedure including the risks, benefits and alternatives for the proposed anesthesia with the patient or authorized representative who has indicated his/her understanding and acceptance.   Dental advisory given  Plan Discussed with: CRNA and Surgeon  Anesthesia Plan Comments:         Anesthesia Quick Evaluation

## 2017-02-19 NOTE — Transfer of Care (Signed)
Immediate Anesthesia Transfer of Care Note  Patient: Katherine Levine  Procedure(s) Performed: Procedure(s): LEFT KNEE ARTHROSCOPY WITH ANTERIOR CRUCIATE LIGAMENT (ACL) REPAIR ALLOGRAFT (Left)  Patient Location: PACU  Anesthesia Type:General  Level of Consciousness: awake and sedated  Airway & Oxygen Therapy: Patient Spontanous Breathing and Patient connected to face mask oxygen  Post-op Assessment: Report given to RN and Post -op Vital signs reviewed and stable  Post vital signs: Reviewed and stable  Last Vitals:  Vitals:   02/19/17 1030 02/19/17 1045  BP: 126/77 128/85  Pulse: 67 77  Resp: 13 16    Last Pain:  Vitals:   02/19/17 1006  PainSc: 0-No pain         Complications: No apparent anesthesia complications

## 2017-02-19 NOTE — Anesthesia Procedure Notes (Signed)
Anesthesia Regional Block: Adductor canal block   Pre-Anesthetic Checklist: ,, timeout performed, Correct Patient, Correct Site, Correct Laterality, Correct Procedure, Correct Position, site marked, Risks and benefits discussed,  Surgical consent,  Pre-op evaluation,  At surgeon's request and post-op pain management  Laterality: Left  Prep: chloraprep       Needles:  Injection technique: Single-shot  Needle Type: Stimiplex     Needle Length: 9cm  Needle Gauge: 21     Additional Needles:   Procedures: ultrasound guided,,,,,,,,  Narrative:  Start time: 02/19/2017 10:08 AM End time: 02/19/2017 10:13 AM Injection made incrementally with aspirations every 5 mL.  Performed by: Personally  Anesthesiologist: Candida Peeling RAY  Additional Notes: 20 ml 0.5% Ropivacaine

## 2017-02-19 NOTE — Interval H&P Note (Signed)
History and Physical Interval Note:  02/19/2017 9:24 AM  Katherine Levine  has presented today for surgery, with the diagnosis of LEFT KNEE ACL TEAR  The various methods of treatment have been discussed with the patient and family. After consideration of risks, benefits and other options for treatment, the patient has consented to  Procedure(s): LEFT KNEE ARTHROSCOPY WITH ANTERIOR CRUCIATE LIGAMENT (ACL) REPAIR (Left) as a surgical intervention .  The patient's history has been reviewed, patient examined, no change in status, stable for surgery.  I have reviewed the patient's chart and labs.  Questions were answered to the patient's satisfaction.     Lyndol Vanderheiden D

## 2017-02-20 ENCOUNTER — Encounter (HOSPITAL_BASED_OUTPATIENT_CLINIC_OR_DEPARTMENT_OTHER): Payer: Self-pay | Admitting: Orthopedic Surgery

## 2017-02-25 DIAGNOSIS — S83512D Sprain of anterior cruciate ligament of left knee, subsequent encounter: Secondary | ICD-10-CM | POA: Diagnosis not present

## 2017-02-25 MED FILL — OMEPRAZOLE DR 20 MG CAPSULE: 20 | 30 days supply | Qty: 30 | Fill #0

## 2017-02-25 MED FILL — traMADol HCL 50 MG TABS: 50 | 10 days supply | Qty: 40 | Fill #0

## 2017-03-01 DIAGNOSIS — S83509A Sprain of unspecified cruciate ligament of unspecified knee, initial encounter: Secondary | ICD-10-CM | POA: Diagnosis not present

## 2017-03-01 DIAGNOSIS — S32599A Other specified fracture of unspecified pubis, initial encounter for closed fracture: Secondary | ICD-10-CM | POA: Diagnosis not present

## 2017-03-02 ENCOUNTER — Ambulatory Visit: Payer: 59 | Attending: Orthopedic Surgery | Admitting: Physical Therapy

## 2017-03-02 ENCOUNTER — Encounter: Payer: Self-pay | Admitting: Physical Therapy

## 2017-03-02 DIAGNOSIS — R2242 Localized swelling, mass and lump, left lower limb: Secondary | ICD-10-CM

## 2017-03-02 DIAGNOSIS — M25562 Pain in left knee: Secondary | ICD-10-CM | POA: Diagnosis not present

## 2017-03-02 DIAGNOSIS — R262 Difficulty in walking, not elsewhere classified: Secondary | ICD-10-CM

## 2017-03-02 DIAGNOSIS — M25662 Stiffness of left knee, not elsewhere classified: Secondary | ICD-10-CM | POA: Diagnosis not present

## 2017-03-02 NOTE — Therapy (Signed)
[facility] [place], [place], [address] Phone: [phone]   Fax:  [phone]  Physical Therapy Evaluation  Patient Details  Name: Katherine Levine MRN: [id number] Date of Birth: Oct 06, 1981 Referring Provider: Edmonia Lynch  Encounter Date: 03/02/2017      PT End of Session - 03/02/17 1044    Visit Number 1   Date for PT Re-Evaluation 05/02/17   PT Start Time 1017   PT Stop Time 1115   PT Time Calculation (min) 58 min   Activity Tolerance Patient limited by pain   Behavior During Therapy [facility] for tasks assessed/performed      Past Medical History:  Diagnosis Date  . Dental crowns present    #8,9,25 per pt.  . History of kidney stones   . Left ACL tear 01/2017    Past Surgical History:  Procedure Laterality Date  . CESAREAN SECTION  2012; 2000   x 2  . CHOLECYSTECTOMY     age 36  . DILITATION & CURRETTAGE/HYSTROSCOPY WITH NOVASURE ABLATION  10/19/2012   Procedure: DILATATION & CURETTAGE/HYSTEROSCOPY WITH NOVASURE ABLATION;  Surgeon: Princess Bruins, MD;  Location: [facility];  Service: Gynecology;  Laterality: N/A;  . KNEE ARTHROSCOPY WITH ANTERIOR CRUCIATE LIGAMENT (ACL) REPAIR Left 02/19/2017   Procedure: LEFT KNEE ARTHROSCOPY WITH ANTERIOR CRUCIATE LIGAMENT (ACL) REPAIR ALLOGRAFT;  Surgeon: Renette Butters, MD;  Location: [facility];  Service: Orthopedics;  Laterality: Left;  . LIVER RESECTION  06/2011  . TUBAL LIGATION      There were no vitals filed for this visit.       Subjective Assessment - 03/02/17 1018    Subjective Patient reports that she injured her left ACL on March 10th, she underwent a left ACL reconstruction on 02/19/17.  They used a cadaver graft.  Patient reports that she has had significant pain and swelling, had some constipation issues due to medications.  She has been in a CPM at home, has only been to 85 degrees in the machine.   Limitations  Walking;Standing;House hold activities   Patient Stated Goals walk, have normal ROM, no pain   Currently in Pain? Yes   Pain Score 4    Pain Location Knee   Pain Orientation Left;Anterior   Pain Descriptors / Indicators Aching;Tightness   Pain Type Surgical pain   Pain Onset 1 to 4 weeks ago   Pain Frequency Constant   Aggravating Factors  bending, walking, pain up to 9/10   Pain Relieving Factors rest and ice and pain meds will help the pain down to a 2-3/10   Effect of Pain on Daily Activities just hard to do everything            [facility] PT Assessment - 03/02/17 0001      Assessment   Medical Diagnosis S/P left ACL reconstruction   Referring Provider Edmonia Lynch   Onset Date/Surgical Date 02/19/17   Prior Therapy no     Precautions   Precaution Comments follow ACL protocol     Balance Screen   Has the patient fallen in the past 6 months No   Has the patient had a decrease in activity level because of a fear of falling?  No   Is the patient reluctant to leave their home because of a fear of falling?  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, some transfers of patients   [facility] 2[facility] per [facility]     Observation/Other Assessments-Edema    Edema Circumferential     Circumferential Edema   Circumferential - Right 40.5 cm   Circumferential - Left  42.5 cm at mid patella     ROM / Strength   AROM / PROM / Strength AROM;PROM;Strength     AROM   AROM Assessment Site Knee   Right/Left Knee Left   Left Knee Extension 28   Left Knee Flexion 65     PROM   PROM Assessment Site Knee   Right/Left Knee Left   Left Knee Extension 8   Left Knee Flexion 72     Strength   Overall Strength Comments 3-/5 with pain     Palpation    Palpation comment tender around the knee and patella, more anterior distal patella     Ambulation/Gait   Gait Comments uses a FWW, slow, has a Bledsoe brace that is locked 0-30 degrees flexion, slow, antalgic on the left                   [facility] Adult PT Treatment/Exercise - 03/02/17 0001      Exercises   Exercises Knee/Hip     Knee/Hip Exercises: Aerobic   Nustep level 3 x 5 minutes   Stepper '                PT Education - 03/02/17 1044    Education provided Yes   Education Details QS, SAQ, heel slides   Person(s) Educated Patient   Methods Explanation;Demonstration;Tactile cues;Verbal cues;Handout   Comprehension Returned demonstration;Verbalized understanding;Tactile cues required          PT Short Term Goals - 03/02/17 1047      PT SHORT TERM GOAL #1   Title independent iwth initial HEP   Time 2   Period Weeks   Status New           PT Long Term Goals - 03/02/17 1047      PT LONG TERM GOAL #1   Title Walk without device, all distances and surfaces with minimal deviation   Time 12   Period Weeks   Status New     PT LONG TERM GOAL #2   Title decrease pain 50%   Time 12   Period Weeks   Status New     PT LONG TERM GOAL #3   Title increase AROM to 0-120 degrees flexion   Time 12   Period Weeks   Status New     PT LONG TERM GOAL #4   Title increase strength to 4+/5   Time 12   Period Weeks   Status New     PT LONG TERM GOAL #5   Title understand and perform RICE at home   Time 12   Period Weeks   Status New               Plan - 03/02/17 1044    Clinical Impression Statement Patient underwent a left ACL reconstruction on 02/19/17.  She reports that she has had a high level of pain and swelling.  She has a CPM at home on 85 dgrees flexion.  She is very limited in her motion, she is very painful  and fearful, she is walking with a FWW, with Bledsoe brace, very slow, afraid to bear weight, has knee in flexion.   Rehab  Potential Good   PT Frequency 3x / week   PT Duration 8 weeks   PT Treatment/Interventions ADLs/Self Care Home Management;Cryotherapy;Electrical Stimulation;Gait training;Stair training;Functional mobility training;Patient/family education;Neuromuscular re-education;Therapeutic exercise;Balance training;Therapeutic activities;Manual techniques;Vasopneumatic Device   PT Next Visit Plan follow ACL protocol   Consulted and Agree with Plan of Care Patient      Patient will benefit from skilled therapeutic intervention in order to improve the following deficits and impairments:  Abnormal gait, Decreased activity tolerance, Decreased balance, Decreased mobility, Decreased range of motion, Decreased scar mobility, Decreased strength, Increased edema, Difficulty walking, Pain  Visit Diagnosis: Acute pain of left knee - Plan: PT plan of care cert/re-cert  Stiffness of left knee, not elsewhere classified - Plan: PT plan of care cert/re-cert  Difficulty in walking, not elsewhere classified - Plan: PT plan of care cert/re-cert  Localized swelling, mass and lump, left lower limb - Plan: PT plan of care cert/re-cert     Problem List Patient Active Problem List   Diagnosis Date Noted  . Left ACL tear 02/17/2017  . Palpitations 03/06/2016  . Burn 06/01/2013  . Fatigue 09/01/2012  . DUB (dysfunctional uterine bleeding) 09/01/2012  . Allergic reaction to drug 07/13/2012  . 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 RUQ abdominal pain 03/12/2012  . Nodule on liver   . NONSPECIFIC ABN FINDING RAD & OTH EXAM GI TRACT 12/26/2010  . OBESITY, UNSPECIFIED 12/17/2010  . ANXIETY STATE, UNSPECIFIED 12/17/2010    Sumner Boast., PT 03/02/2017, 10:56 AM  [address] [place], [place], [address] Phone: [phone]   Fax:  [phone]  Name: Katherine Levine MRN: [id number] Date of Birth: 04/06/1981

## 2017-03-04 ENCOUNTER — Encounter: Payer: Self-pay | Admitting: Physical Therapy

## 2017-03-04 ENCOUNTER — Ambulatory Visit: Payer: 59 | Admitting: Physical Therapy

## 2017-03-04 DIAGNOSIS — M25662 Stiffness of left knee, not elsewhere classified: Secondary | ICD-10-CM | POA: Diagnosis not present

## 2017-03-04 DIAGNOSIS — R2242 Localized swelling, mass and lump, left lower limb: Secondary | ICD-10-CM | POA: Diagnosis not present

## 2017-03-04 DIAGNOSIS — R262 Difficulty in walking, not elsewhere classified: Secondary | ICD-10-CM

## 2017-03-04 DIAGNOSIS — M25562 Pain in left knee: Secondary | ICD-10-CM

## 2017-03-04 NOTE — Therapy (Signed)
[facility] [place], [place], [address] Phone: [phone]   Fax:  [phone]  Physical Therapy Treatment  Patient Details  Name: Katherine Levine MRN: [id number] Date of Birth: 14-Dec-1980 Referring Provider: Edmonia Lynch  Encounter Date: 03/04/2017      PT End of Session - 03/04/17 1513    Visit Number 2   Date for PT Re-Evaluation 05/02/17   PT Start Time 1430   PT Stop Time 1526   PT Time Calculation (min) 56 min   Activity Tolerance Patient limited by pain   Behavior During Therapy [facility] for tasks assessed/performed      Past Medical History:  Diagnosis Date  . Dental crowns present    #8,9,25 per pt.  . History of kidney stones   . Left ACL tear 01/2017    Past Surgical History:  Procedure Laterality Date  . CESAREAN SECTION  2012; 2000   x 2  . CHOLECYSTECTOMY     age 13  . DILITATION & CURRETTAGE/HYSTROSCOPY WITH NOVASURE ABLATION  10/19/2012   Procedure: DILATATION & CURETTAGE/HYSTEROSCOPY WITH NOVASURE ABLATION;  Surgeon: Princess Bruins, MD;  Location: [facility];  Service: Gynecology;  Laterality: N/A;  . KNEE ARTHROSCOPY WITH ANTERIOR CRUCIATE LIGAMENT (ACL) REPAIR Left 02/19/2017   Procedure: LEFT KNEE ARTHROSCOPY WITH ANTERIOR CRUCIATE LIGAMENT (ACL) REPAIR ALLOGRAFT;  Surgeon: Renette Butters, MD;  Location: [place];  Service: Orthopedics;  Laterality: Left;  . LIVER RESECTION  06/2011  . TUBAL LIGATION      There were no vitals filed for this visit.      Subjective Assessment - 03/04/17 1436    Subjective Pt reports that she has been having a little more pain, Pt reports that she has continued to use the CPM machine.   Pain Score 5    Pain Location Knee   Pain Orientation Left                         [facility] Adult PT Treatment/Exercise - 03/04/17 0001      Knee/Hip Exercises: Aerobic   Nustep level 3 x 6 minutes     Knee/Hip  Exercises: Seated   Other Seated Knee/Hip Exercises SAQ AROM L 3x10, seated TKE with yellow theraband 2x10   Marching Limitations Standing March RLE HHAx2 2x10   Hamstring Curl AROM;Left;10 reps;3 sets  Yellow theraband   Sit to Sand 10 reps;without UE support;with UE support  x2, some reps yellow tband resisting TKE LLE; elevated UBE      Modalities   Modalities Vasopneumatic     Vasopneumatic   Number Minutes Vasopneumatic  15 minutes   Vasopnuematic Location  Knee   Vasopneumatic Pressure Medium   Vasopneumatic Temperature  40     Manual Therapy   Manual Therapy Passive ROM   Manual therapy comments some PROM with knee flex/ext held at end                   PT Short Term Goals - 03/02/17 1047      PT SHORT TERM GOAL #1   Title independent iwth initial HEP   Time 2   Period Weeks   Status New           PT Long Term Goals - 03/02/17 1047      PT LONG TERM GOAL #1   Title Walk without device, all distances and surfaces with minimal deviation  Time 12   Period Weeks   Status New     PT LONG TERM GOAL #2   Title decrease pain 50%   Time 12   Period Weeks   Status New     PT LONG TERM GOAL #3   Title increase AROM to 0-120 degrees flexion   Time 12   Period Weeks   Status New     PT LONG TERM GOAL #4   Title increase strength to 4+/5   Time 12   Period Weeks   Status New     PT LONG TERM GOAL #5   Title understand and perform RICE at home   Time 12   Period Weeks   Status New               Plan - 03/04/17 1515    Clinical Impression Statement Pt tolerated an initial progression to exercises well. Does report some pain with MT at end range of flexion.  L knee PROM improved after MT. Pt able to complete HS curls and SAQ without issue.   Rehab Potential Good   PT Frequency 3x / week   PT Duration 8 weeks   PT Treatment/Interventions ADLs/Self Care Home Management;Cryotherapy;Electrical Stimulation;Gait training;Stair  training;Functional mobility training;Patient/family education;Neuromuscular re-education;Therapeutic exercise;Balance training;Therapeutic activities;Manual techniques;Vasopneumatic Device   PT Next Visit Plan follow ACL protocol      Patient will benefit from skilled therapeutic intervention in order to improve the following deficits and impairments:  Abnormal gait, Decreased activity tolerance, Decreased balance, Decreased mobility, Decreased range of motion, Decreased scar mobility, Decreased strength, Increased edema, Difficulty walking, Pain  Visit Diagnosis: Acute pain of left knee  Stiffness of left knee, not elsewhere classified  Difficulty in walking, not elsewhere classified  Localized swelling, mass and lump, left lower limb     Problem List Patient Active Problem List   Diagnosis Date Noted  . Left ACL tear 02/17/2017  . Palpitations 03/06/2016  . Burn 06/01/2013  . Fatigue 09/01/2012  . DUB (dysfunctional uterine bleeding) 09/01/2012  . Allergic reaction to drug 07/13/2012  . 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 RUQ abdominal pain 03/12/2012  . Nodule on liver   . NONSPECIFIC ABN FINDING RAD & OTH EXAM GI TRACT 12/26/2010  . OBESITY, UNSPECIFIED 12/17/2010  . ANXIETY STATE, UNSPECIFIED 12/17/2010    Scot Jun, PTA 03/04/2017, 3:22 PM  [facility] [place] [address] [place], [place], [address] Phone: [phone]   Fax:  [phone]  Name: Katherine Levine MRN: [id number] Date of Birth: [birth year]

## 2017-03-10 ENCOUNTER — Ambulatory Visit: Payer: 59 | Admitting: Physical Therapy

## 2017-03-10 ENCOUNTER — Encounter: Payer: Self-pay | Admitting: Physical Therapy

## 2017-03-10 DIAGNOSIS — M25662 Stiffness of left knee, not elsewhere classified: Secondary | ICD-10-CM | POA: Diagnosis not present

## 2017-03-10 DIAGNOSIS — M25562 Pain in left knee: Secondary | ICD-10-CM | POA: Diagnosis not present

## 2017-03-10 DIAGNOSIS — R2242 Localized swelling, mass and lump, left lower limb: Secondary | ICD-10-CM

## 2017-03-10 DIAGNOSIS — R262 Difficulty in walking, not elsewhere classified: Secondary | ICD-10-CM | POA: Diagnosis not present

## 2017-03-10 NOTE — Therapy (Signed)
[facility] [place], [place], [address] Phone: [phone]   Fax:  [phone]  Physical Therapy Treatment  Patient Details  Name: Katherine Levine MRN: [id number] Date of Birth: 03-Aug-1981 Referring Provider: Edmonia Lynch  Encounter Date: 03/10/2017      PT End of Session - 03/10/17 1141    Visit Number 3   Date for PT Re-Evaluation 05/02/17   PT Start Time 1100   PT Stop Time 1151   PT Time Calculation (min) 51 min   Activity Tolerance Patient tolerated treatment well   Behavior During Therapy [facility] for tasks assessed/performed      Past Medical History:  Diagnosis Date  . Dental crowns present    #8,9,25 per pt.  . History of kidney stones   . Left ACL tear 01/2017    Past Surgical History:  Procedure Laterality Date  . CESAREAN SECTION  2012; 2000   x 2  . CHOLECYSTECTOMY     age 79  . DILITATION & CURRETTAGE/HYSTROSCOPY WITH NOVASURE ABLATION  10/19/2012   Procedure: DILATATION & CURETTAGE/HYSTEROSCOPY WITH NOVASURE ABLATION;  Surgeon: Princess Bruins, MD;  Location: [facility];  Service: Gynecology;  Laterality: N/A;  . KNEE ARTHROSCOPY WITH ANTERIOR CRUCIATE LIGAMENT (ACL) REPAIR Left 02/19/2017   Procedure: LEFT KNEE ARTHROSCOPY WITH ANTERIOR CRUCIATE LIGAMENT (ACL) REPAIR ALLOGRAFT;  Surgeon: Renette Butters, MD;  Location: [facility] [facility];  Service: Orthopedics;  Laterality: Left;  . LIVER RESECTION  06/2011  . TUBAL LIGATION      There were no vitals filed for this visit.      Subjective Assessment - 03/10/17 1105    Subjective Pt reports that she is able to get around better. Pt voiced her concern that she is extremely allergic to metal and reports an aching feeling in her knee, a similar aching feeling when metal touches her body.   Currently in Pain? Yes   Pain Score 5    Pain Location Knee   Pain Orientation Left                         [facility]  Adult PT Treatment/Exercise - 03/10/17 0001      Knee/Hip Exercises: Aerobic   Nustep level 3 x 7 minutes     Knee/Hip Exercises: Seated   Hamstring Curl AROM;Left;2 sets;15 reps   Hamstring Limitations red tband   Sit to Sand 10 reps;without UE support;with UE support     Knee/Hip Exercises: Supine   Quad Sets 2 sets;10 reps;Left   Quad Sets Limitations blue band   Short Arc Quad Sets Left;3 sets;10 reps   Straight Leg Raises 2 sets;Left;10 reps     Modalities   Modalities Cryotherapy     Cryotherapy   Number Minutes Cryotherapy 15 Minutes   Cryotherapy Location Knee   Type of Cryotherapy Ice pack     Manual Therapy   Manual Therapy Passive ROM   Manual therapy comments some PROM with knee flex/ext held at end                   PT Short Term Goals - 03/02/17 1047      PT SHORT TERM GOAL #1   Title independent iwth initial HEP   Time 2   Period Weeks   Status New           PT Long Term Goals - 03/02/17 1047  PT LONG TERM GOAL #1   Title Walk without device, all distances and surfaces with minimal deviation   Time 12   Period Weeks   Status New     PT LONG TERM GOAL #2   Title decrease pain 50%   Time 12   Period Weeks   Status New     PT LONG TERM GOAL #3   Title increase AROM to 0-120 degrees flexion   Time 12   Period Weeks   Status New     PT LONG TERM GOAL #4   Title increase strength to 4+/5   Time 12   Period Weeks   Status New     PT LONG TERM GOAL #5   Title understand and perform RICE at home   Time 12   Period Weeks   Status New               Plan - 03/10/17 1142    Clinical Impression Statement Pt required cues to give better effort during warm up. Pt reports pain with MT at end range of L knee flexion and extension. Pt able to complete all exercises well. Pt L knee extension appeared to improve after exercises.   Rehab Potential Good   PT Frequency 3x / week   PT Duration 8 weeks   PT  Treatment/Interventions ADLs/Self Care Home Management;Cryotherapy;Electrical Stimulation;Gait training;Stair training;Functional mobility training;Patient/family education;Neuromuscular re-education;Therapeutic exercise;Balance training;Therapeutic activities;Manual techniques;Vasopneumatic Device   PT Next Visit Plan follow ACL protocol      Patient will benefit from skilled therapeutic intervention in order to improve the following deficits and impairments:  Abnormal gait, Decreased activity tolerance, Decreased balance, Decreased mobility, Decreased range of motion, Decreased scar mobility, Decreased strength, Increased edema, Difficulty walking, Pain  Visit Diagnosis: Acute pain of left knee  Stiffness of left knee, not elsewhere classified  Difficulty in walking, not elsewhere classified  Localized swelling, mass and lump, left lower limb     Problem List Patient Active Problem List   Diagnosis Date Noted  . Left ACL tear 02/17/2017  . Palpitations 03/06/2016  . Burn 06/01/2013  . Fatigue 09/01/2012  . DUB (dysfunctional uterine bleeding) 09/01/2012  . Allergic reaction to drug 07/13/2012  . 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 RUQ abdominal pain 03/12/2012  . Nodule on liver   . NONSPECIFIC ABN FINDING RAD & OTH EXAM GI TRACT 12/26/2010  . OBESITY, UNSPECIFIED 12/17/2010  . ANXIETY STATE, UNSPECIFIED 12/17/2010    Scot Jun, PTA 03/10/2017, 11:44 AM  [facility] [place], [place], [address] Phone: [phone]   Fax:  [phone]  Name: Milee Qualls MRN: [id number] Date of Birth: 02-03-1981

## 2017-03-11 DIAGNOSIS — S83512D Sprain of anterior cruciate ligament of left knee, subsequent encounter: Secondary | ICD-10-CM | POA: Diagnosis not present

## 2017-03-11 MED FILL — ONDANSETRON HCL 4 MG TABLET: 4 | 8 days supply | Qty: 30 | Fill #0

## 2017-03-11 MED FILL — HYDROCODON-APAP 5-325: 5-325 | 10 days supply | Qty: 60 | Fill #0

## 2017-03-12 ENCOUNTER — Encounter: Payer: Self-pay | Admitting: Physical Therapy

## 2017-03-12 ENCOUNTER — Ambulatory Visit: Payer: 59 | Admitting: Physical Therapy

## 2017-03-12 DIAGNOSIS — M25562 Pain in left knee: Secondary | ICD-10-CM

## 2017-03-12 DIAGNOSIS — R2242 Localized swelling, mass and lump, left lower limb: Secondary | ICD-10-CM | POA: Diagnosis not present

## 2017-03-12 DIAGNOSIS — R262 Difficulty in walking, not elsewhere classified: Secondary | ICD-10-CM

## 2017-03-12 DIAGNOSIS — M25662 Stiffness of left knee, not elsewhere classified: Secondary | ICD-10-CM

## 2017-03-12 NOTE — Therapy (Signed)
[facility] [place], [place], [address] Phone: [phone]   Fax:  [phone]  Physical Therapy Treatment  Patient Details  Name: Katherine Levine MRN: [id number] Date of Birth: 1981/11/05 Referring Provider: Edmonia Lynch  Encounter Date: 03/12/2017      PT End of Session - 03/12/17 1157    Visit Number 4   Date for PT Re-Evaluation 05/02/17   PT Start Time 1117   PT Stop Time 1200   PT Time Calculation (min) 43 min   Activity Tolerance Patient tolerated treatment well   Behavior During Therapy [facility] for tasks assessed/performed      Past Medical History:  Diagnosis Date  . Dental crowns present    #8,9,25 per pt.  . History of kidney stones   . Left ACL tear 01/2017    Past Surgical History:  Procedure Laterality Date  . CESAREAN SECTION  2012; 2000   x 2  . CHOLECYSTECTOMY     age 64  . DILITATION & CURRETTAGE/HYSTROSCOPY WITH NOVASURE ABLATION  10/19/2012   Procedure: DILATATION & CURETTAGE/HYSTEROSCOPY WITH NOVASURE ABLATION;  Surgeon: Princess Bruins, MD;  Location: [facility];  Service: Gynecology;  Laterality: N/A;  . KNEE ARTHROSCOPY WITH ANTERIOR CRUCIATE LIGAMENT (ACL) REPAIR Left 02/19/2017   Procedure: LEFT KNEE ARTHROSCOPY WITH ANTERIOR CRUCIATE LIGAMENT (ACL) REPAIR ALLOGRAFT;  Surgeon: Renette Butters, MD;  Location: [place];  Service: Orthopedics;  Laterality: Left;  . LIVER RESECTION  06/2011  . TUBAL LIGATION      There were no vitals filed for this visit.      Subjective Assessment - 03/12/17 1119    Subjective Pt reports that she didn't sleep well because she has to change positions a lot due to her knee.   Currently in Pain? Yes   Pain Score 4    Pain Location Knee   Pain Orientation Left                         [facility] Adult PT Treatment/Exercise - 03/12/17 0001      Knee/Hip Exercises: Aerobic   Nustep level 3 x 6 minutes  No  UE     Knee/Hip Exercises: Machines for Strengthening   Cybex Leg Press 20lb 2x15, 10-60 deg     Knee/Hip Exercises: Supine   Quad Sets 2 sets;10 reps;Left   Quad Sets Limitations black band    Short Arc Quad Sets Left;3 sets;10 reps     Modalities   Modalities Cryotherapy     Cryotherapy   Number Minutes Cryotherapy 10 Minutes   Cryotherapy Location Knee   Type of Cryotherapy Ice pack     Manual Therapy   Manual Therapy Passive ROM   Manual therapy comments some PROM with knee flex/ext held at end                   PT Short Term Goals - 03/02/17 1047      PT SHORT TERM GOAL #1   Title independent iwth initial HEP   Time 2   Period Weeks   Status New           PT Long Term Goals - 03/12/17 1159      PT LONG TERM GOAL #1   Title Walk without device, all distances and surfaces with minimal deviation   Status On-going     PT LONG TERM GOAL #2   Title  decrease pain 50%   Status On-going     PT LONG TERM GOAL #3   Title increase AROM to 0-120 degrees flexion   Status On-going               Plan - 03/12/17 1157    Clinical Impression Statement Pt ~ 17 minutes late for today's treatment. Pt tolerated NuStep interventions without UE support. Pt still reports pain with MT at end ranges. Progressed to forward and lateral step on 4 inch box. Pt does required cues to push down through with LLE during step ups   Rehab Potential Good   PT Frequency 3x / week   PT Duration 8 weeks   PT Treatment/Interventions ADLs/Self Care Home Management;Cryotherapy;Electrical Stimulation;Gait training;Stair training;Functional mobility training;Patient/family education;Neuromuscular re-education;Therapeutic exercise;Balance training;Therapeutic activities;Manual techniques;Vasopneumatic Device   PT Next Visit Plan follow ACL protocol      Patient will benefit from skilled therapeutic intervention in order to improve the following deficits and impairments:  Abnormal  gait, Decreased activity tolerance, Decreased balance, Decreased mobility, Decreased range of motion, Decreased scar mobility, Decreased strength, Increased edema, Difficulty walking, Pain  Visit Diagnosis: Acute pain of left knee  Stiffness of left knee, not elsewhere classified  Difficulty in walking, not elsewhere classified     Problem List Patient Active Problem List   Diagnosis Date Noted  . Left ACL tear 02/17/2017  . Palpitations 03/06/2016  . Burn 06/01/2013  . Fatigue 09/01/2012  . DUB (dysfunctional uterine bleeding) 09/01/2012  . Allergic reaction to drug 07/13/2012  . 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 RUQ abdominal pain 03/12/2012  . Nodule on liver   . NONSPECIFIC ABN FINDING RAD & OTH EXAM GI TRACT 12/26/2010  . OBESITY, UNSPECIFIED 12/17/2010  . ANXIETY STATE, UNSPECIFIED 12/17/2010    Scot Jun, PTA 03/12/2017, 12:00 PM  [place], [place], [address] Phone: [phone]   Fax:  [phone]  Name: Katherine Levine MRN: [id number] Date of Birth: 1981/06/07

## 2017-03-19 ENCOUNTER — Ambulatory Visit: Payer: 59 | Admitting: Physical Therapy

## 2017-03-19 DIAGNOSIS — M25662 Stiffness of left knee, not elsewhere classified: Secondary | ICD-10-CM

## 2017-03-19 DIAGNOSIS — R262 Difficulty in walking, not elsewhere classified: Secondary | ICD-10-CM

## 2017-03-19 DIAGNOSIS — R2242 Localized swelling, mass and lump, left lower limb: Secondary | ICD-10-CM | POA: Diagnosis not present

## 2017-03-19 DIAGNOSIS — M25562 Pain in left knee: Secondary | ICD-10-CM

## 2017-03-19 NOTE — Therapy (Signed)
[place], [place], [address] Phone: [phone]   Fax:  [phone]  Physical Therapy Treatment  Patient Details  Name: Marshea Wisher MRN: [id number] Date of Birth: 06-Jul-1981 Referring Provider: Edmonia Lynch  Encounter Date: 03/19/2017      PT End of Session - 03/19/17 1635    Visit Number 5   Date for PT Re-Evaluation 05/02/17   PT Start Time 1630  pt. arrived late    PT Stop Time 1715   PT Time Calculation (min) 45 min   Activity Tolerance Patient tolerated treatment well   Behavior During Therapy [facility] for tasks assessed/performed      Past Medical History:  Diagnosis Date  . Dental crowns present    #8,9,25 per pt.  . History of kidney stones   . Left ACL tear 01/2017    Past Surgical History:  Procedure Laterality Date  . CESAREAN SECTION  2012; 2000   x 2  . CHOLECYSTECTOMY     age 82  . DILITATION & CURRETTAGE/HYSTROSCOPY WITH NOVASURE ABLATION  10/19/2012   Procedure: DILATATION & CURETTAGE/HYSTEROSCOPY WITH NOVASURE ABLATION;  Surgeon: Princess Bruins, MD;  Location: [facility];  Service: Gynecology;  Laterality: N/A;  . KNEE ARTHROSCOPY WITH ANTERIOR CRUCIATE LIGAMENT (ACL) REPAIR Left 02/19/2017   Procedure: LEFT KNEE ARTHROSCOPY WITH ANTERIOR CRUCIATE LIGAMENT (ACL) REPAIR ALLOGRAFT;  Surgeon: Renette Butters, MD;  Location: [place];  Service: Orthopedics;  Laterality: Left;  . LIVER RESECTION  06/2011  . TUBAL LIGATION      There were no vitals filed for this visit.      Subjective Assessment - 03/19/17 1633    Subjective Pt. reports she is late due to getting stuck in traffic.    Patient Stated Goals walk, have normal ROM, no pain   Currently in Pain? Yes   Pain Score 4    Pain Location Knee   Pain Orientation Left   Pain Descriptors / Indicators Aching;Tightness   Pain Type Surgical pain   Aggravating Factors  Seated, sleeping, walking     Multiple Pain Sites No                         [facility] Adult PT Treatment/Exercise - 03/19/17 1642      Knee/Hip Exercises: Aerobic   Nustep level 4 x 6 minutes     Knee/Hip Exercises: Standing   Forward Lunges Right;Left;1 set;15 reps;3 seconds   Forward Lunges Limitations holding 8# dumbbells    Side Lunges Right;Left;1 set;10 reps;3 seconds   Functional Squat --   Other Standing Knee Exercises Deep sqaut with red p-ball between knees x 15 reps; avoiding full lock out     Knee/Hip Exercises: Seated   Hamstring Curl AROM;Left;2 sets;10 reps   Hamstring Limitations green TB   Sit to Sand 10 reps;without UE support;2 sets     Knee/Hip Exercises: Supine   Quad Sets 2 sets;10 reps;Left   Short Arc Quad Sets Left;2 sets;15 reps   Straight Leg Raises Left;15 reps;3 sets     Vasopneumatic   Number Minutes Vasopneumatic  15 minutes   Vasopnuematic Location  Knee  L    Vasopneumatic Pressure Medium   Vasopneumatic Temperature  40                  PT Short Term Goals - 03/02/17 1047      PT SHORT TERM  GOAL #1   Title independent iwth initial HEP   Time 2   Period Weeks   Status New           PT Long Term Goals - 03/12/17 1159      PT LONG TERM GOAL #1   Title Walk without device, all distances and surfaces with minimal deviation   Status On-going     PT LONG TERM GOAL #2   Title decrease pain 50%   Status On-going     PT LONG TERM GOAL #3   Title increase AROM to 0-120 degrees flexion   Status On-going               Plan - 03/19/17 1635    Clinical Impression Statement Pt. arriving 15 min late to therapy.  Pt. performed well with therex today with pain rising some.  Some increased reps with supine level activities focusing on quad control.  Treatment ending with ice/compression to decrease post-exercise swelling/pain.     PT Treatment/Interventions ADLs/Self Care Home Management;Cryotherapy;Electrical Stimulation;Gait  training;Stair training;Functional mobility training;Patient/family education;Neuromuscular re-education;Therapeutic exercise;Balance training;Therapeutic activities;Manual techniques;Vasopneumatic Device   PT Next Visit Plan follow ACL protocol      Patient will benefit from skilled therapeutic intervention in order to improve the following deficits and impairments:  Abnormal gait, Decreased activity tolerance, Decreased balance, Decreased mobility, Decreased range of motion, Decreased scar mobility, Decreased strength, Increased edema, Difficulty walking, Pain  Visit Diagnosis: Acute pain of left knee  Stiffness of left knee, not elsewhere classified  Difficulty in walking, not elsewhere classified  Localized swelling, mass and lump, left lower limb     Problem List Patient Active Problem List   Diagnosis Date Noted  . Left ACL tear 02/17/2017  . Palpitations 03/06/2016  . Burn 06/01/2013  . Fatigue 09/01/2012  . DUB (dysfunctional uterine bleeding) 09/01/2012  . Allergic reaction to drug 07/13/2012  . 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 RUQ abdominal pain 03/12/2012  . Nodule on liver   . NONSPECIFIC ABN FINDING RAD & OTH EXAM GI TRACT 12/26/2010  . OBESITY, UNSPECIFIED 12/17/2010  . ANXIETY STATE, UNSPECIFIED 12/17/2010    Bess Harvest, PTA 03/19/17 6:00 PM  [place] [address] [place], [place], [address] Phone: [phone]   Fax:  [phone]  Name: Jonique Kulig MRN: [id number] Date of Birth: 1981-09-19

## 2017-03-20 ENCOUNTER — Ambulatory Visit: Payer: 59 | Admitting: Physical Therapy

## 2017-03-20 ENCOUNTER — Encounter: Payer: Self-pay | Admitting: Physical Therapy

## 2017-03-20 DIAGNOSIS — M25562 Pain in left knee: Secondary | ICD-10-CM

## 2017-03-20 DIAGNOSIS — R262 Difficulty in walking, not elsewhere classified: Secondary | ICD-10-CM | POA: Diagnosis not present

## 2017-03-20 DIAGNOSIS — M25662 Stiffness of left knee, not elsewhere classified: Secondary | ICD-10-CM | POA: Diagnosis not present

## 2017-03-20 DIAGNOSIS — R2242 Localized swelling, mass and lump, left lower limb: Secondary | ICD-10-CM

## 2017-03-20 NOTE — Therapy (Signed)
[place], [place], [address] Phone: [phone]   Fax:  [phone]  Physical Therapy Treatment  Patient Details  Name: Katherine Levine MRN: [id number] Date of Birth: 03-04-1981 Referring Provider: Edmonia Lynch  Encounter Date: 03/20/2017      PT End of Session - 03/20/17 1054    Visit Number 6   Date for PT Re-Evaluation 05/02/17   PT Start Time 6195   PT Stop Time 1109   PT Time Calculation (min) 54 min   Activity Tolerance Patient tolerated treatment well   Behavior During Therapy [facility] for tasks assessed/performed      Past Medical History:  Diagnosis Date  . Dental crowns present    #8,9,25 per pt.  . History of kidney stones   . Left ACL tear 01/2017    Past Surgical History:  Procedure Laterality Date  . CESAREAN SECTION  2012; 2000   x 2  . CHOLECYSTECTOMY     age 36  . DILITATION & CURRETTAGE/HYSTROSCOPY WITH NOVASURE ABLATION  10/19/2012   Procedure: DILATATION & CURETTAGE/HYSTEROSCOPY WITH NOVASURE ABLATION;  Surgeon: Princess Bruins, MD;  Location: [facility];  Service: Gynecology;  Laterality: N/A;  . KNEE ARTHROSCOPY WITH ANTERIOR CRUCIATE LIGAMENT (ACL) REPAIR Left 02/19/2017   Procedure: LEFT KNEE ARTHROSCOPY WITH ANTERIOR CRUCIATE LIGAMENT (ACL) REPAIR ALLOGRAFT;  Surgeon: Renette Butters, MD;  Location: [place];  Service: Orthopedics;  Laterality: Left;  . LIVER RESECTION  06/2011  . TUBAL LIGATION      There were no vitals filed for this visit.      Subjective Assessment - 03/20/17 1012    Subjective Pt reports pain as a 4. Says that she was here yesterday so she thinks thats why she is in so much pain today   Limitations Walking;Standing;House hold activities   Currently in Pain? Yes   Pain Score 4    Pain Location Knee   Pain Orientation Left                         [facility] Adult PT Treatment/Exercise - 03/20/17 0001       Knee/Hip Exercises: Aerobic   Stationary Bike lvl 6 min     Knee/Hip Exercises: Machines for Strengthening   Cybex Leg Press 20lb 2x15, 10-60 deg     Knee/Hip Exercises: Standing   Lateral Step Up 2 sets;10 reps;Step Height: 6";Hand Hold: 2   Forward Step Up 2 sets;10 reps;Step Height: 6";Hand Hold: 2   Forward Step Up Limitations --  HHA     Knee/Hip Exercises: Seated   Heel Slides 2 sets   Hamstring Curl AROM;3 sets;10 reps   Hamstring Limitations green TB     Knee/Hip Exercises: Supine   Quad Sets 3 sets;10 reps   Quad Sets Limitations black t band    Heel Slides 10 reps;3 sets   Straight Leg Raises Left;15 reps;3 sets     Vasopneumatic   Number Minutes Vasopneumatic  15 minutes   Vasopnuematic Location  Knee   Vasopneumatic Pressure Medium   Vasopneumatic Temperature  40                  PT Short Term Goals - 03/02/17 1047      PT SHORT TERM GOAL #1   Title independent iwth initial HEP   Time 2   Period Weeks   Status New  PT Long Term Goals - 03/20/17 1102      PT LONG TERM GOAL #1   Title Walk without device, all distances and surfaces with minimal deviation   Time 12   Period Weeks   Status On-going     PT LONG TERM GOAL #2   Title decrease pain 50%   Status On-going     PT LONG TERM GOAL #3   Title increase AROM to 0-120 degrees flexion   Time 12   Period Weeks   Status On-going     PT LONG TERM GOAL #4   Title increase strength to 4+/5               Plan - 03/20/17 1055    Clinical Impression Statement Pt. progressed to stationary bike today no resistance. Had difficulty intially getting full revolution but after first 2 mins of warm up it became easier. Performed exercises well. Focused on quad control again today. Pt reported pain during supine exerices icreased from 4 to 6. No increase in pain for other exercises. Pt had more difficulty with fwd step ups  compared to lateral step ups. Possible progression  with step height next visit for lateral step ups.    Rehab Potential Good   PT Frequency 3x / week   PT Duration 8 weeks   PT Treatment/Interventions ADLs/Self Care Home Management;Cryotherapy;Electrical Stimulation;Gait training;Stair training;Functional mobility training;Patient/family education;Neuromuscular re-education;Therapeutic exercise;Balance training;Therapeutic activities;Manual techniques;Vasopneumatic Device   PT Next Visit Plan follow ACL protocol      Patient will benefit from skilled therapeutic intervention in order to improve the following deficits and impairments:  Abnormal gait, Decreased activity tolerance, Decreased balance, Decreased mobility, Decreased range of motion, Decreased scar mobility, Decreased strength, Increased edema, Difficulty walking, Pain  Visit Diagnosis: Acute pain of left knee  Stiffness of left knee, not elsewhere classified  Difficulty in walking, not elsewhere classified  Localized swelling, mass and lump, left lower limb     Problem List Patient Active Problem List   Diagnosis Date Noted  . Left ACL tear 02/17/2017  . Palpitations 03/06/2016  . Burn 06/01/2013  . Fatigue 09/01/2012  . DUB (dysfunctional uterine bleeding) 09/01/2012  . Allergic reaction to drug 07/13/2012  . 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 RUQ abdominal pain 03/12/2012  . Nodule on liver   . NONSPECIFIC ABN FINDING RAD & OTH EXAM GI TRACT 12/26/2010  . OBESITY, UNSPECIFIED 12/17/2010  . ANXIETY STATE, UNSPECIFIED 12/17/2010    Octavia Bruckner 03/20/2017, 11:03 AM  [place] [address] [place], [place], [address] Phone: [phone]   Fax:  [phone]  Name: Jowanda Heeg MRN: [id number] Date of Birth: September 09, 1981

## 2017-03-25 ENCOUNTER — Ambulatory Visit: Payer: 59 | Admitting: Physical Therapy

## 2017-03-25 DIAGNOSIS — M25562 Pain in left knee: Secondary | ICD-10-CM | POA: Diagnosis not present

## 2017-03-25 DIAGNOSIS — R262 Difficulty in walking, not elsewhere classified: Secondary | ICD-10-CM | POA: Diagnosis not present

## 2017-03-25 DIAGNOSIS — R2242 Localized swelling, mass and lump, left lower limb: Secondary | ICD-10-CM | POA: Diagnosis not present

## 2017-03-25 DIAGNOSIS — M25662 Stiffness of left knee, not elsewhere classified: Secondary | ICD-10-CM | POA: Diagnosis not present

## 2017-03-25 NOTE — Therapy (Signed)
[place], [place], [address] Phone: [phone]   Fax:  [phone]  Physical Therapy Treatment  Patient Details  Name: Katherine Levine MRN: [id number] Date of Birth: 03/06/1981 Referring Provider: Edmonia Lynch  Encounter Date: 03/25/2017      PT End of Session - 03/25/17 1112    Visit Number 7   Date for PT Re-Evaluation 05/02/17   PT Start Time 1105   PT Stop Time 1159  10 min moist heat to end tx   PT Time Calculation (min) 54 min   Activity Tolerance Patient tolerated treatment well   Behavior During Therapy [facility] for tasks assessed/performed      Past Medical History:  Diagnosis Date  . Dental crowns present    #8,9,25 per pt.  . History of kidney stones   . Left ACL tear 01/2017    Past Surgical History:  Procedure Laterality Date  . CESAREAN SECTION  2012; 2000   x 2  . CHOLECYSTECTOMY     age 21  . DILITATION & CURRETTAGE/HYSTROSCOPY WITH NOVASURE ABLATION  10/19/2012   Procedure: DILATATION & CURETTAGE/HYSTEROSCOPY WITH NOVASURE ABLATION;  Surgeon: Princess Bruins, MD;  Location: [facility];  Service: Gynecology;  Laterality: N/A;  . KNEE ARTHROSCOPY WITH ANTERIOR CRUCIATE LIGAMENT (ACL) REPAIR Left 02/19/2017   Procedure: LEFT KNEE ARTHROSCOPY WITH ANTERIOR CRUCIATE LIGAMENT (ACL) REPAIR ALLOGRAFT;  Surgeon: Renette [place]ters, MD;  Location: [place];  Service: Orthopedics;  Laterality: Left;  . LIVER RESECTION  06/2011  . TUBAL LIGATION      There were no vitals filed for this visit.      Subjective Assessment - 03/25/17 1109    Subjective Pt. reporting pain has been better over the last few days.     Patient Stated Goals walk, have normal ROM, no pain   Currently in Pain? Yes   Pain Score 3    Pain Location Knee   Pain Orientation Left   Pain Descriptors / Indicators Aching;Tightness   Pain Type Surgical pain   Pain Onset More than a month ago    Pain Frequency Constant   Aggravating Factors  sleeping    Multiple Pain Sites No            [facility] PT Assessment - 03/25/17 1127      Assessment   Next MD Visit 5.9.18                     [facility] Adult PT Treatment/Exercise - 03/25/17 1119      Knee/Hip Exercises: Aerobic   Stationary Bike lvl 6 min     Knee/Hip Exercises: Standing   Forward Step Up 10 reps;Step Height: 6";1 set;Left;Hand Hold: 1   Forward Step Up Limitations support on chair    Other Standing Knee Exercises Alternating black bolster step over with L TKE focus with heel strike 2 x 15 reps; tolerated well      Knee/Hip Exercises: Seated   Hamstring Curl AROM;Strengthening;Left;15 reps;2 sets   Hamstring Limitations green TB     Knee/Hip Exercises: Supine   Short Arc Quad Sets Left;2 sets;15 reps  3" hold    Short Arc Quad Sets Limitations 2#    Straight Leg Raises Left;20 reps;2 sets  quad set prior to each rep     Knee/Hip Exercises: Sidelying   Hip ABduction AROM;Left;1 set;15 reps;Strengthening   Hip ABduction Limitations 2#    Hip ADduction  AROM;Left;Strengthening;1 set;15 reps   Hip ADduction Limitations 2#      Cryotherapy   Number Minutes Cryotherapy 10 Minutes   Cryotherapy Location Knee   Type of Cryotherapy Ice pack                  PT Short Term Goals - 03/25/17 1125      PT SHORT TERM GOAL #1   Title independent iwth initial HEP   Time 2   Period Weeks   Status On-going           PT Long Term Goals - 03/20/17 1102      PT LONG TERM GOAL #1   Title Walk without device, all distances and surfaces with minimal deviation   Time 12   Period Weeks   Status On-going     PT LONG TERM GOAL #2   Title decrease pain 50%   Status On-going     PT LONG TERM GOAL #3   Title increase AROM to 0-120 degrees flexion   Time 12   Period Weeks   Status On-going     PT LONG TERM GOAL #4   Title increase strength to 4+/5               Plan -  03/25/17 1125    Clinical Impression Statement Pt. doing well today with, "average pain" level.  Pt. reporting she is only consistently performing SLR HEP activity at home.  Continued focus on quad control with therex today.  Standing TKE bolster step over tolerated well.  Pain remained <4/10 with therex today.  Pt. will continue to benefit from further skilled therapy to improve functional strength and ROM per protocol.      PT Treatment/Interventions ADLs/Self Care Home Management;Cryotherapy;Electrical Stimulation;Gait training;Stair training;Functional mobility training;Patient/family education;Neuromuscular re-education;Therapeutic exercise;Balance training;Therapeutic activities;Manual techniques;Vasopneumatic Device   PT Next Visit Plan follow ACL protocol      Patient will benefit from skilled therapeutic intervention in order to improve the following deficits and impairments:  Abnormal gait, Decreased activity tolerance, Decreased balance, Decreased mobility, Decreased range of motion, Decreased scar mobility, Decreased strength, Increased edema, Difficulty walking, Pain  Visit Diagnosis: Acute pain of left knee  Stiffness of left knee, not elsewhere classified  Difficulty in walking, not elsewhere classified  Localized swelling, mass and lump, left lower limb     Problem List Patient Active Problem List   Diagnosis Date Noted  . Left ACL tear 02/17/2017  . Palpitations 03/06/2016  . Burn 06/01/2013  . Fatigue 09/01/2012  . DUB (dysfunctional uterine bleeding) 09/01/2012  . Allergic reaction to drug 07/13/2012  . 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 RUQ abdominal pain 03/12/2012  . Nodule on liver   . NONSPECIFIC ABN FINDING RAD & OTH EXAM GI TRACT 12/26/2010  . OBESITY, UNSPECIFIED 12/17/2010  . ANXIETY STATE, UNSPECIFIED 12/17/2010    Bess Harvest, PTA 03/25/17 12:04 PM  [place] [address] [place] [address] [place],  [place], [address] Phone: [phone]   Fax:  [phone]  Name: Katherine Levine MRN: [id number] Date of Birth: [birth year]

## 2017-04-02 ENCOUNTER — Ambulatory Visit: Payer: 59 | Admitting: Physical Therapy

## 2017-04-03 ENCOUNTER — Ambulatory Visit: Payer: 59 | Attending: Orthopedic Surgery | Admitting: Physical Therapy

## 2017-04-03 ENCOUNTER — Ambulatory Visit: Payer: 59 | Admitting: Physical Therapy

## 2017-04-03 DIAGNOSIS — R262 Difficulty in walking, not elsewhere classified: Secondary | ICD-10-CM | POA: Insufficient documentation

## 2017-04-03 DIAGNOSIS — M25662 Stiffness of left knee, not elsewhere classified: Secondary | ICD-10-CM | POA: Insufficient documentation

## 2017-04-03 DIAGNOSIS — R2242 Localized swelling, mass and lump, left lower limb: Secondary | ICD-10-CM | POA: Insufficient documentation

## 2017-04-03 DIAGNOSIS — M25562 Pain in left knee: Secondary | ICD-10-CM | POA: Insufficient documentation

## 2017-04-03 IMAGING — CR DG HIP (WITH OR WITHOUT PELVIS) 2-3V*L*
3 series · 3 of 3 positions shown · non-contrast
Comparison: None.

CLINICAL DATA: Left hip pain post fall

EXAM:
DG HIP (WITH OR WITHOUT PELVIS) 2-3V LEFT

[AP (1 of 2)]
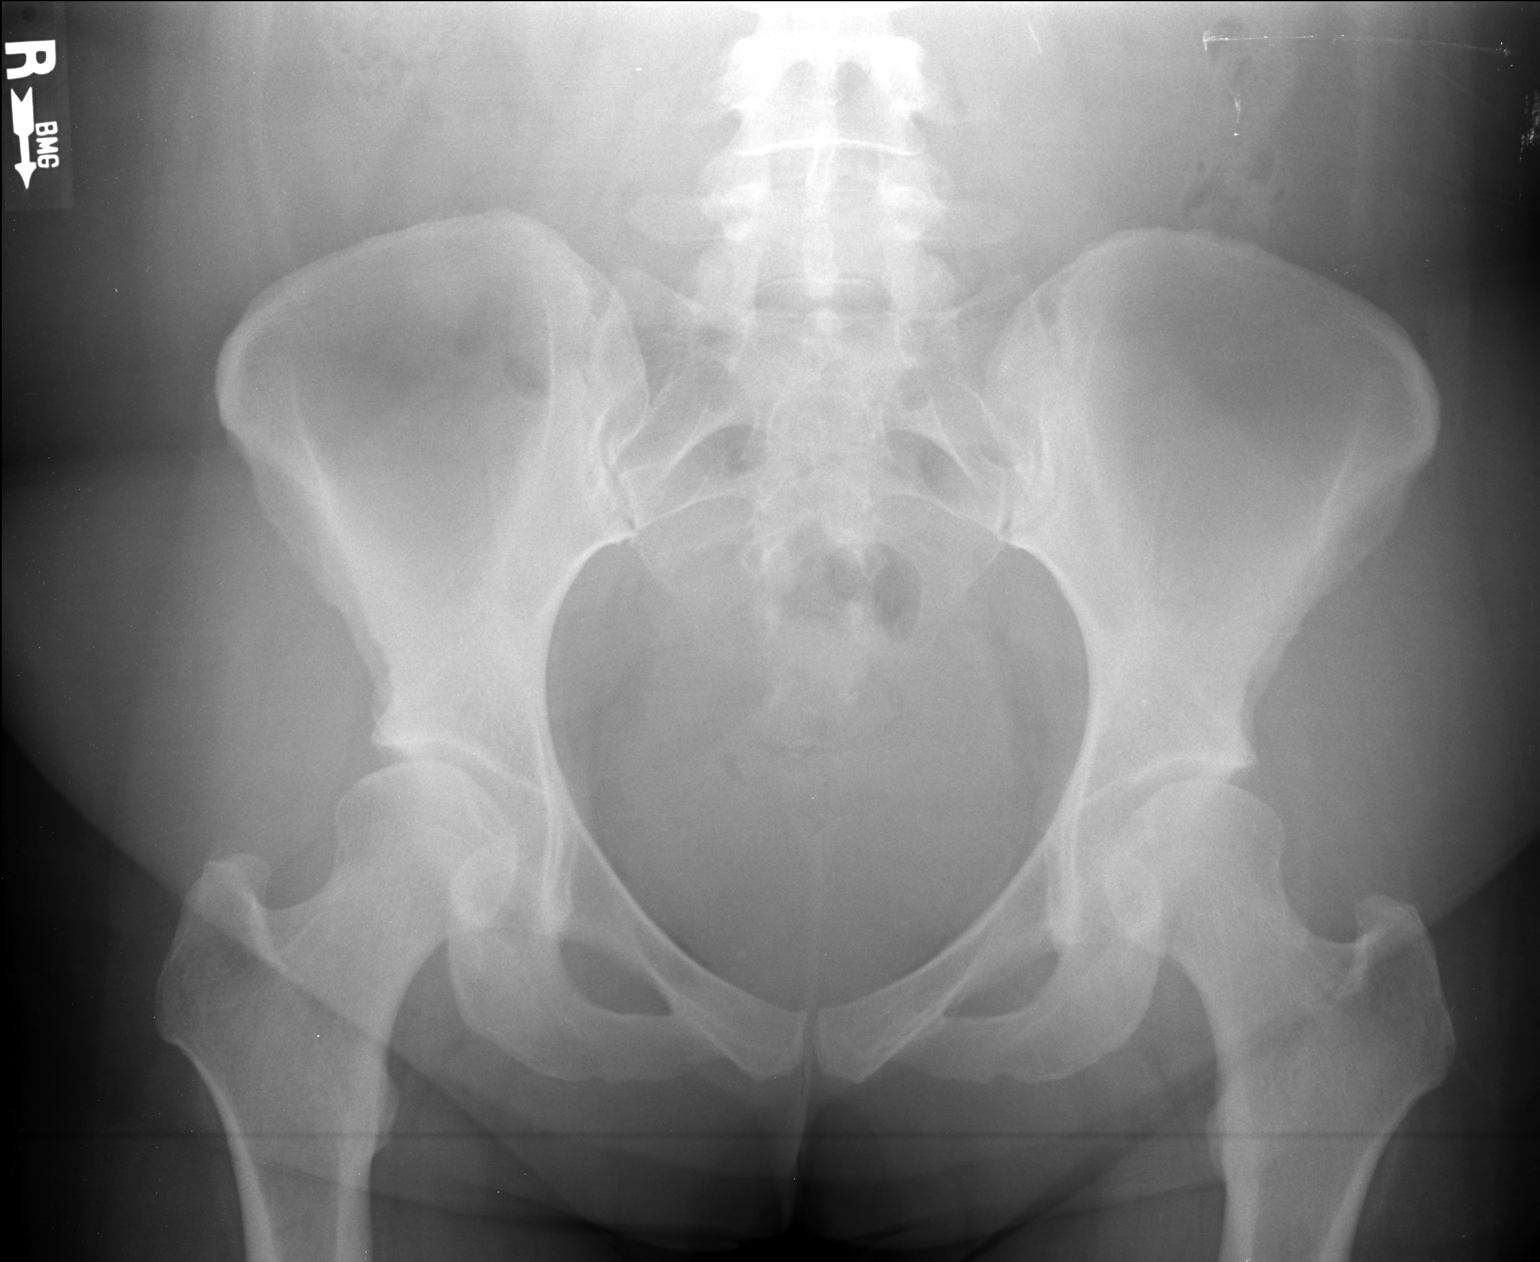

[AP (2 of 2)]
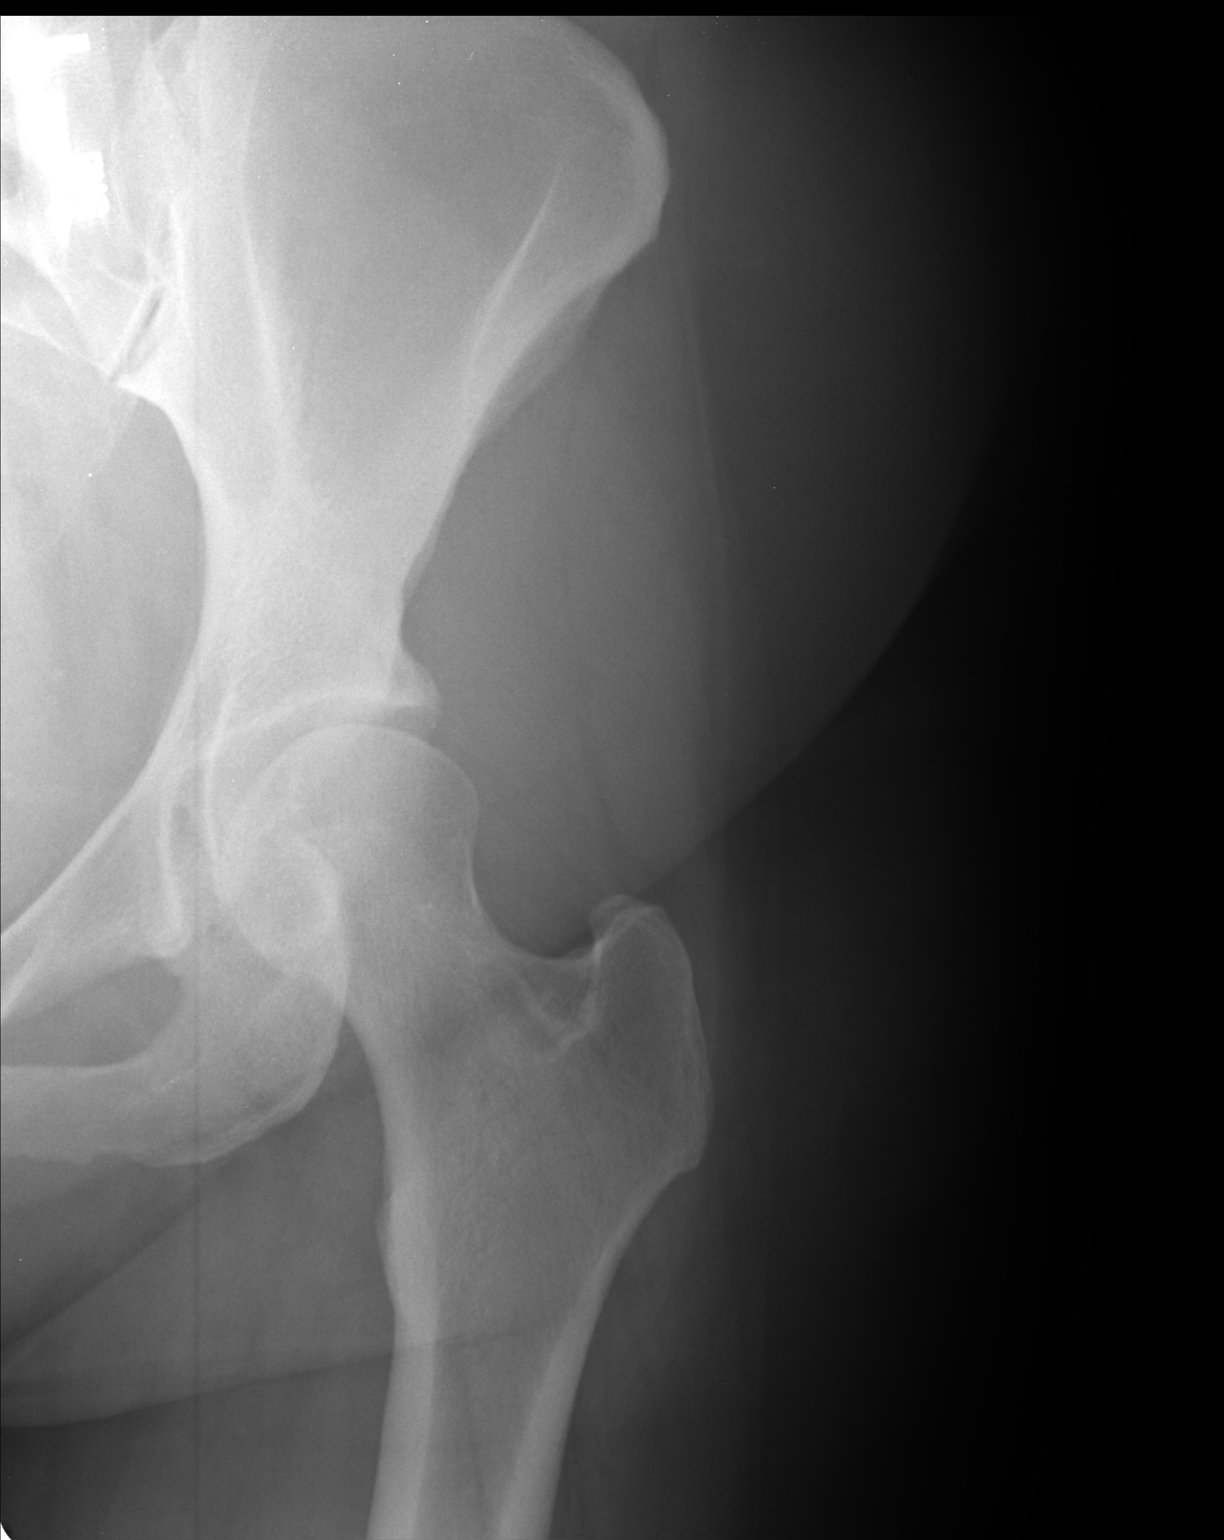

[lateral]
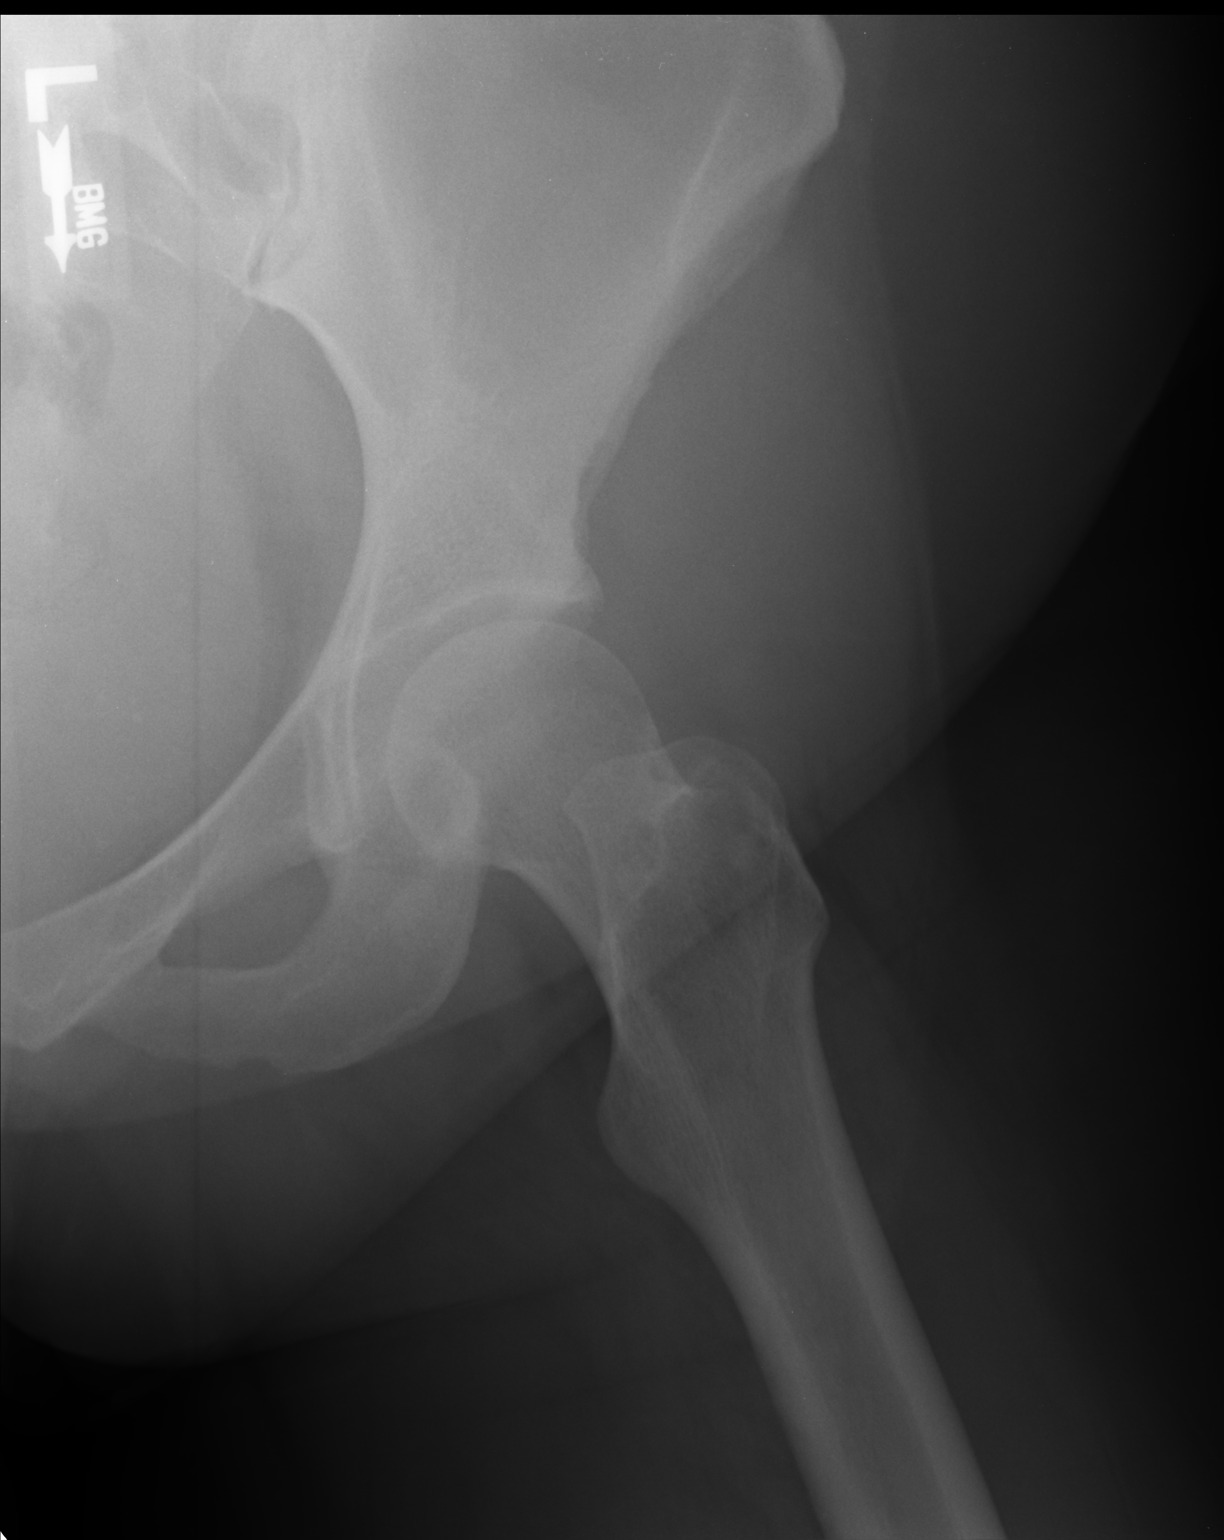

[3 of 3 positions shown; findings below may reference images not displayed]

FINDINGS: Three views of the left hip submitted. No acute fracture or
subluxation. No radiopaque foreign body.
IMPRESSION: Negative.

## 2017-04-03 NOTE — Therapy (Signed)
[place] [address]  [place], [place], [address] Phone: [phone]   Fax:  [phone]  Physical Therapy Treatment  Patient Details  Name: Katherine Levine MRN: [id number] Date of Birth: 02-May-1981 Referring Provider: Edmonia Lynch  Encounter Date: 04/03/2017      PT End of Session - 04/03/17 1106    Visit Number 8   Date for PT Re-Evaluation 05/02/17   PT Start Time 9381   PT Stop Time 1056   PT Time Calculation (min) 41 min   Activity Tolerance Patient tolerated treatment well   Behavior During Therapy [facility] for tasks assessed/performed      Past Medical History:  Diagnosis Date  . Dental crowns present    #8,9,25 per pt.  . History of kidney stones   . Left ACL tear 01/2017    Past Surgical History:  Procedure Laterality Date  . CESAREAN SECTION  2012; 2000   x 2  . CHOLECYSTECTOMY     age 61  . DILITATION & CURRETTAGE/HYSTROSCOPY WITH NOVASURE ABLATION  10/19/2012   Procedure: DILATATION & CURETTAGE/HYSTEROSCOPY WITH NOVASURE ABLATION;  Surgeon: Princess Bruins, MD;  Location: [facility];  Service: Gynecology;  Laterality: N/A;  . KNEE ARTHROSCOPY WITH ANTERIOR CRUCIATE LIGAMENT (ACL) REPAIR Left 02/19/2017   Procedure: LEFT KNEE ARTHROSCOPY WITH ANTERIOR CRUCIATE LIGAMENT (ACL) REPAIR ALLOGRAFT;  Surgeon: Renette [place], MD;  Location: [facility];  Service: Orthopedics;  Laterality: Left;  . LIVER RESECTION  06/2011  . TUBAL LIGATION      There were no vitals filed for this visit.      Subjective Assessment - 04/03/17 1015    Subjective Continues to have pain and burning and shooting pains. Does have difficulty sleeping - feels like she is making progress wiith daily activities. A lot of pain 2 days ago coming down steps - feels like her foot got out too far in front of her. Has been out of brace - get new one in May at follow-up   Patient Stated Goals walk, have normal ROM,  no pain   Currently in Pain? Yes   Pain Score 3    Pain Location Knee   Pain Orientation Left;Posterior;Lateral   Pain Descriptors / Indicators Aching;Tightness   Pain Type Surgical pain                         [facility] Adult PT Treatment/Exercise - 04/03/17 0001      Knee/Hip Exercises: Aerobic   Recumbent Bike L2 x 6 minutes     Knee/Hip Exercises: Standing   Forward Lunges Right;Left;15 reps   Functional Squat 15 reps   Functional Squat Limitations 1 UE support   Wall Squat 15 reps;3 seconds   Other Standing Knee Exercises narrow BOS on foam x 30 sec; tandem stance B LE x 30 sec on foam; SL stance on firm ground 3 x 30 sec; SL stance with 3 cone taps x 10     Knee/Hip Exercises: Seated   Long Arc Quad Strengthening;Left;2 sets;15 reps;Weights   Long Arc Quad Weight 2 lbs.   Long [employer] Limitations with adduction ball squeeze   Hamstring Curl Strengthening;Left;2 sets;15 reps   Hamstring Limitations green TB     Knee/Hip Exercises: Supine   Straight Leg Raises Strengthening;Left;2 sets;15 reps   Straight Leg Raises Limitations 2#     Knee/Hip Exercises: Sidelying   Hip ABduction Strengthening;Left;2 sets;15 reps  Hip ABduction Limitations 2#                   PT Short Term Goals - 03/25/17 1125      PT SHORT TERM GOAL #1   Title independent iwth initial HEP   Time 2   Period Weeks   Status On-going           PT Long Term Goals - 03/20/17 1102      PT LONG TERM GOAL #1   Title Walk without device, all distances and surfaces with minimal deviation   Time 12   Period Weeks   Status On-going     PT LONG TERM GOAL #2   Title decrease pain 50%   Status On-going     PT LONG TERM GOAL #3   Title increase AROM to 0-120 degrees flexion   Time 12   Period Weeks   Status On-going     PT LONG TERM GOAL #4   Title increase strength to 4+/5               Plan - 04/03/17 1106    Clinical Impression Statement Patient doing  well today - continued to report knee pain with sleeping and with prolonged standing. Reports stepping wrong off of step and now having slight posterior knee pain. Doing well with all strengthening and proprioception activities with no increae in pain during session    PT Treatment/Interventions ADLs/Self Care Home Management;Cryotherapy;Electrical Stimulation;Gait training;Stair training;Functional mobility training;Patient/family education;Neuromuscular re-education;Therapeutic exercise;Balance training;Therapeutic activities;Manual techniques;Vasopneumatic Device   PT Next Visit Plan follow ACL protocol   Consulted and Agree with Plan of Care Patient      Patient will benefit from skilled therapeutic intervention in order to improve the following deficits and impairments:  Abnormal gait, Decreased activity tolerance, Decreased balance, Decreased mobility, Decreased range of motion, Decreased scar mobility, Decreased strength, Increased edema, Difficulty walking, Pain  Visit Diagnosis: Acute pain of left knee  Stiffness of left knee, not elsewhere classified  Difficulty in walking, not elsewhere classified  Localized swelling, mass and lump, left lower limb     Problem List Patient Active Problem List   Diagnosis Date Noted  . Left ACL tear 02/17/2017  . Palpitations 03/06/2016  . Burn 06/01/2013  . Fatigue 09/01/2012  . DUB (dysfunctional uterine bleeding) 09/01/2012  . Allergic reaction to drug 07/13/2012  . 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 RUQ abdominal pain 03/12/2012  . Nodule on liver   . NONSPECIFIC ABN FINDING RAD & OTH EXAM GI TRACT 12/26/2010  . OBESITY, UNSPECIFIED 12/17/2010  . ANXIETY STATE, UNSPECIFIED 12/17/2010     Lanney Gins, PT, DPT 04/03/17 11:08 AM   [facility] [address]  [place], [place], [address] Phone: [phone]   Fax:  [phone]  Name: Katherine Levine MRN:  [id number] Date of Birth: 1981/01/10

## 2017-04-06 ENCOUNTER — Ambulatory Visit: Payer: 59 | Admitting: Physical Therapy

## 2017-04-06 DIAGNOSIS — R2242 Localized swelling, mass and lump, left lower limb: Secondary | ICD-10-CM

## 2017-04-06 DIAGNOSIS — M25662 Stiffness of left knee, not elsewhere classified: Secondary | ICD-10-CM

## 2017-04-06 DIAGNOSIS — M25562 Pain in left knee: Secondary | ICD-10-CM | POA: Diagnosis not present

## 2017-04-06 DIAGNOSIS — R262 Difficulty in walking, not elsewhere classified: Secondary | ICD-10-CM

## 2017-04-06 NOTE — Therapy (Signed)
[place] [address]  [place], [place], [address] Phone: [phone]   Fax:  [phone]  Physical Therapy Treatment  Patient Details  Name: Katherine Levine MRN: [id number] Date of Birth: 11-Nov-1981 Referring Provider: Edmonia Lynch  Encounter Date: 04/06/2017      PT End of Session - 04/06/17 1454    Visit Number 9   Date for PT Re-Evaluation 05/02/17   PT Start Time 1451   PT Stop Time 1530   PT Time Calculation (min) 39 min   Activity Tolerance Patient tolerated treatment well   Behavior During Therapy [facility] for tasks assessed/performed      Past Medical History:  Diagnosis Date  . Dental crowns present    #8,9,25 per pt.  . History of kidney stones   . Left ACL tear 01/2017    Past Surgical History:  Procedure Laterality Date  . CESAREAN SECTION  2012; 2000   x 2  . CHOLECYSTECTOMY     age 29  . DILITATION & CURRETTAGE/HYSTROSCOPY WITH NOVASURE ABLATION  10/19/2012   Procedure: DILATATION & CURETTAGE/HYSTEROSCOPY WITH NOVASURE ABLATION;  Surgeon: Princess Bruins, MD;  Location: [facility];  Service: Gynecology;  Laterality: N/A;  . KNEE ARTHROSCOPY WITH ANTERIOR CRUCIATE LIGAMENT (ACL) REPAIR Left 02/19/2017   Procedure: LEFT KNEE ARTHROSCOPY WITH ANTERIOR CRUCIATE LIGAMENT (ACL) REPAIR ALLOGRAFT;  Surgeon: Renette [place], MD;  Location: [facility];  Service: Orthopedics;  Laterality: Left;  . LIVER RESECTION  06/2011  . TUBAL LIGATION      There were no vitals filed for this visit.      Subjective Assessment - 04/06/17 1453    Subjective Felt well after last session - no new complaints   Patient Stated Goals walk, have normal ROM, no pain   Currently in Pain? Yes   Pain Score 3    Pain Location Knee   Pain Orientation Left   Pain Descriptors / Indicators Aching;Tightness;Sore   Pain Type Surgical pain                         [facility] Adult PT  Treatment/Exercise - 04/06/17 1455      Knee/Hip Exercises: Stretches   Gastroc Stretch Left;3 reps;30 seconds   Gastroc Stretch Limitations blue rocker     Knee/Hip Exercises: Aerobic   Recumbent Bike L3 x 6 minutes     Knee/Hip Exercises: Standing   Heel Raises 15 reps   Hip Flexion Stengthening;Left;20 reps;Knee straight   Hip Flexion Limitations red tband   Forward Lunges Right;Left;15 reps   Hip ADduction Strengthening;Left;20 reps   Hip ADduction Limitations red tband    Hip Abduction Stengthening;Left;20 reps;Knee straight   Abduction Limitations red tband    Hip Extension Stengthening;Left;20 reps;Knee straight   Extension Limitations red tband   Step Down Left;2 sets;15 reps   Step Down Limitations 1 set 4", 1 set 6"   Functional Squat 15 reps   Wall Squat 15 reps;3 seconds   Wall Squat Limitations with ball squeeze     Knee/Hip Exercises: Seated   Long Arc Quad Strengthening;Left;1 set;15 reps;Weights   Long Arc Quad Weight 2 lbs.   Long [employer] Limitations with adduction ball squeeze   Other Seated Knee/Hip Exercises Fitter - L LE - 1 black/1 blue - 2 x 15   Hamstring Curl Strengthening;Left;1 set;15 reps   Hamstring Limitations green TB     Knee/Hip Exercises: Supine  Straight Leg Raises Strengthening;Left;2 sets;15 reps   Straight Leg Raises Limitations 2#                  PT Short Term Goals - 03/25/17 1125      PT SHORT TERM GOAL #1   Title independent iwth initial HEP   Time 2   Period Weeks   Status On-going           PT Long Term Goals - 03/20/17 1102      PT LONG TERM GOAL #1   Title Walk without device, all distances and surfaces with minimal deviation   Time 12   Period Weeks   Status On-going     PT LONG TERM GOAL #2   Title decrease pain 50%   Status On-going     PT LONG TERM GOAL #3   Title increase AROM to 0-120 degrees flexion   Time 12   Period Weeks   Status On-going     PT LONG TERM GOAL #4   Title  increase strength to 4+/5               Plan - 04/06/17 1527    Clinical Impression Statement Katherine Levine doing well today - posterior knee feels much better. Doing well with all stretching and strength activities with no issue - only appropriate muscle soreness and fatigue. Will conitnue to progress functional mobility.   PT Treatment/Interventions ADLs/Self Care Home Management;Cryotherapy;Electrical Stimulation;Gait training;Stair training;Functional mobility training;Patient/family education;Neuromuscular re-education;Therapeutic exercise;Balance training;Therapeutic activities;Manual techniques;Vasopneumatic Device   PT Next Visit Plan follow ACL protocol   Consulted and Agree with Plan of Care Patient      Patient will benefit from skilled therapeutic intervention in order to improve the following deficits and impairments:  Abnormal gait, Decreased activity tolerance, Decreased balance, Decreased mobility, Decreased range of motion, Decreased scar mobility, Decreased strength, Increased edema, Difficulty walking, Pain  Visit Diagnosis: Acute pain of left knee  Stiffness of left knee, not elsewhere classified  Difficulty in walking, not elsewhere classified  Localized swelling, mass and lump, left lower limb     Problem List Patient Active Problem List   Diagnosis Date Noted  . Left ACL tear 02/17/2017  . Palpitations 03/06/2016  . Burn 06/01/2013  . Fatigue 09/01/2012  . DUB (dysfunctional uterine bleeding) 09/01/2012  . Allergic reaction to drug 07/13/2012  . 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 RUQ abdominal pain 03/12/2012  . Nodule on liver   . NONSPECIFIC ABN FINDING RAD & OTH EXAM GI TRACT 12/26/2010  . OBESITY, UNSPECIFIED 12/17/2010  . ANXIETY STATE, UNSPECIFIED 12/17/2010     Lanney Gins, PT, DPT 04/06/17 4:32 PM   [facility] [address]  [place], [place], [address] Phone: [phone]    Fax:  [phone]  Name: Katherine Levine MRN: [id number] Date of Birth: 1981-02-02

## 2017-04-07 ENCOUNTER — Ambulatory Visit: Payer: 59 | Admitting: Physical Therapy

## 2017-04-08 ENCOUNTER — Encounter: Payer: 59 | Admitting: Physical Therapy

## 2017-04-10 ENCOUNTER — Ambulatory Visit: Payer: 59 | Admitting: Physical Therapy

## 2017-04-10 DIAGNOSIS — S83512D Sprain of anterior cruciate ligament of left knee, subsequent encounter: Secondary | ICD-10-CM | POA: Diagnosis not present

## 2017-04-12 ENCOUNTER — Telehealth: Payer: 59 | Admitting: Family

## 2017-04-12 DIAGNOSIS — J019 Acute sinusitis, unspecified: Secondary | ICD-10-CM | POA: Diagnosis not present

## 2017-04-12 MED ORDER — DOXYCYCLINE HYCLATE 100 MG PO TABS: 100.0000 mg | ORAL_TABLET | Freq: Two times a day (BID) | ORAL | 0 refills | Status: DC

## 2017-04-12 MED ORDER — BENZONATATE 100 MG PO CAPS: 100.0000 mg | ORAL_CAPSULE | Freq: Three times a day (TID) | ORAL | 0 refills | Status: DC | PRN

## 2017-04-12 NOTE — Progress Notes (Signed)
We are sorry that you are not feeling well.  Here is how we plan to help!  Based on what you have shared with me it looks like you have sinusitis.  Sinusitis is inflammation and infection in the sinus cavities of the head.  Based on your presentation I believe you most likely have Acute Bacterial Sinusitis.  This is an infection caused by bacteria and is treated with antibiotics. I have prescribed Doxycycline 100mg  by mouth twice a day for 10 days. You may use an oral decongestant such as Mucinex D or if you have glaucoma or high blood pressure use plain Mucinex. Saline nasal spray help and can safely be used as often as needed for congestion.  If you develop worsening sinus pain, fever or notice severe headache and vision changes, or if symptoms are not better after completion of antibiotic, please schedule an appointment with a health care provider.    In addition you may use A non-prescription cough medication called Robitussin DAC. Take 2 teaspoons every 8 hours or Delsym: take 2 teaspoons every 12 hours., A non-prescription cough medication called Mucinex DM: take 2 tablets every 12 hours. and A prescription cough medication called Tessalon Perles 100mg . You may take 1-2 capsules every 8 hours as needed for your cough.  Sinus infections are not as easily transmitted as other respiratory infection, however we still recommend that you avoid close contact with loved ones, especially the very young and elderly.  Remember to wash your hands thoroughly throughout the day as this is the number one way to prevent the spread of infection!  Home Care:  Only take medications as instructed by your medical team.  Complete the entire course of an antibiotic.  Do not take these medications with alcohol.  A steam or ultrasonic humidifier can help congestion.  You can place a towel over your head and breathe in the steam from hot water coming from a faucet.  Avoid close contacts especially the very young and  the elderly.  Cover your mouth when you cough or sneeze.  Always remember to wash your hands.  Get Help Right Away If:  You develop worsening fever or sinus pain.  You develop a severe head ache or visual changes.  Your symptoms persist after you have completed your treatment plan.  Make sure you  Understand these instructions.  Will watch your condition.  Will get help right away if you are not doing well or get worse.  Your e-visit answers were reviewed by a board certified advanced clinical practitioner to complete your personal care plan.  Depending on the condition, your plan could have included both over the counter or prescription medications.  If there is a problem please reply  once you have received a response from your provider.  Your safety is important to [place].  If you have drug allergies check your prescription carefully.    You can use MyChart to ask questions about today's visit, request a non-urgent call back, or ask for a work or school excuse for 24 hours related to this e-Visit. If it has been greater than 24 hours you will need to follow up with your provider, or enter a new e-Visit to address those concerns.  You will get an e-mail in the next two days asking about your experience.  I hope that your e-visit has been valuable and will speed your recovery. Thank you for using e-visits.

## 2017-04-14 ENCOUNTER — Ambulatory Visit: Payer: 59

## 2017-04-14 DIAGNOSIS — R2242 Localized swelling, mass and lump, left lower limb: Secondary | ICD-10-CM | POA: Diagnosis not present

## 2017-04-14 DIAGNOSIS — R262 Difficulty in walking, not elsewhere classified: Secondary | ICD-10-CM | POA: Diagnosis not present

## 2017-04-14 DIAGNOSIS — M25562 Pain in left knee: Secondary | ICD-10-CM

## 2017-04-14 DIAGNOSIS — M25662 Stiffness of left knee, not elsewhere classified: Secondary | ICD-10-CM | POA: Diagnosis not present

## 2017-04-14 NOTE — Therapy (Addendum)
[facility] [address]  [place], [place], [address] Phone: [phone]   Fax:  [phone]  Physical Therapy Treatment  Patient Details  Name: Katherine Levine MRN: [id number] Date of Birth: 02-02-1981 Referring Provider: Edmonia Lynch  Encounter Date: 04/14/2017      PT End of Session - 04/14/17 1421    Visit Number 10   Date for PT Re-Evaluation 05/02/17   PT Start Time 2376   PT Stop Time 1458   PT Time Calculation (min) 55 min   Activity Tolerance Patient tolerated treatment well   Behavior During Therapy [facility] for tasks assessed/performed      Past Medical History:  Diagnosis Date  . Dental crowns present    #8,9,25 per pt.  . History of kidney stones   . Left ACL tear 01/2017    Past Surgical History:  Procedure Laterality Date  . CESAREAN SECTION  2012; 2000   x 2  . CHOLECYSTECTOMY     age 36  . DILITATION & CURRETTAGE/HYSTROSCOPY WITH NOVASURE ABLATION  10/19/2012   Procedure: DILATATION & CURETTAGE/HYSTEROSCOPY WITH NOVASURE ABLATION;  Surgeon: Princess Bruins, MD;  Location: [facility];  Service: Gynecology;  Laterality: N/A;  . KNEE ARTHROSCOPY WITH ANTERIOR CRUCIATE LIGAMENT (ACL) REPAIR Left 02/19/2017   Procedure: LEFT KNEE ARTHROSCOPY WITH ANTERIOR CRUCIATE LIGAMENT (ACL) REPAIR ALLOGRAFT;  Surgeon: Renette Butters, MD;  Location: [facility];  Service: Orthopedics;  Laterality: Left;  . LIVER RESECTION  06/2011  . TUBAL LIGATION      There were no vitals filed for this visit.      Subjective Assessment - 04/14/17 1406    Subjective Pt. reporting she is not waking with pain now.     Patient Stated Goals walk, have normal ROM, no pain   Currently in Pain? Yes   Pain Score 2    Pain Location Knee   Pain Orientation Left   Pain Descriptors / Indicators Aching;Dull   Pain Type Surgical pain   Pain Onset More than a month ago   Pain Frequency Constant   Aggravating  Factors  Sleeping    Pain Relieving Factors ice, elevate, pain meds   Multiple Pain Sites No            [facility] PT Assessment - 04/14/17 1428      AROM   AROM Assessment Site Knee   Right/Left Knee Left   Left Knee Extension 4   Left Knee Flexion 118     Strength   Overall Strength Comments L hip abduction 4/5, R hip adduction 4/5, L hip adduction 4-/5, R hip abduction 4/5, B hip extension 4/5, B hip flexion 4-/5, L knee extension 4-/5, R knee extension 4+/5, R knee flexion 4+/5, L knee flexion 4-/5                      [facility] Adult PT Treatment/Exercise - 04/14/17 1414      Knee/Hip Exercises: Aerobic   Recumbent Bike L3 x 6 minutes     Knee/Hip Exercises: Standing   Heel Raises 15 reps   Functional Squat 15 reps   Functional Squat Limitations 1 UE support; cueing to prevent deep squat    Wall Squat --     Knee/Hip Exercises: Supine   Bridges with Clamshell Strengthening;Both;1 set;15 reps  with green TB around knees    Straight Leg Raises Strengthening;Left;2 sets;15 reps   Straight Leg Raises Limitations  2#      Knee/Hip Exercises: Sidelying   Clams R sidelying L clam shell with green TB x 15 reps     Vasopneumatic   Number Minutes Vasopneumatic  10 minutes   Vasopnuematic Location  Knee  L    Vasopneumatic Pressure High   Vasopneumatic Temperature  coldest temp. (pillow case)      Manual Therapy   Manual Therapy Taping   Manual therapy comments L knee taping to improve patellar tracking                PT Education - 04/14/17 1845    Education provided Yes   Education Details mini squats at counter, Supine SLR with yellow looped TB resistance issued to pt., heel raise    Person(s) Educated Patient   Methods Explanation;Demonstration;Verbal cues;Handout   Comprehension Verbalized understanding;Returned demonstration;Verbal cues required;Need further instruction          PT Short Term Goals - 03/25/17 1125      PT SHORT TERM GOAL  #1   Title independent iwth initial HEP   Time 2   Period Weeks   Status On-going           PT Long Term Goals - 04/14/17 1444      PT LONG TERM GOAL #1   Title Walk without device, all distances and surfaces with minimal deviation   Time 12   Period Weeks   Status On-going     PT LONG TERM GOAL #2   Title decrease pain 50%   Status Achieved     PT LONG TERM GOAL #3   Title increase AROM to 0-120 degrees flexion   Time 12   Period Weeks   Status On-going  5.15.18:  4-118 dg      PT LONG TERM GOAL #4   Title increase strength to 4+/5   Status On-going               Plan - 04/14/17 1451    Clinical Impression Statement Pt. with some LE strength improvement however has still grossly not met goal.  L knee AROM 4-118 dg today.  L quad still visibly weak with SLR and HEP updated to include more strengthening activity.  Conversation with pt. today on importance for daily adherence to HEP.  Pt. returning to full time work schedule on Monday 5.21.18.  Pt. noting "catching" pain with SLR today and extension activities thus trial of taping to L knee to improve patellar tracking.  Treatment ending with ice/compression upon pt. request.     PT Treatment/Interventions ADLs/Self Care Home Management;Cryotherapy;Electrical Stimulation;Gait training;Stair training;Functional mobility training;Patient/family education;Neuromuscular re-education;Therapeutic exercise;Balance training;Therapeutic activities;Manual techniques;Vasopneumatic Device   PT Next Visit Plan follow ACL protocol      Patient will benefit from skilled therapeutic intervention in order to improve the following deficits and impairments:  Abnormal gait, Decreased activity tolerance, Decreased balance, Decreased mobility, Decreased range of motion, Decreased scar mobility, Decreased strength, Increased edema, Difficulty walking, Pain  Visit Diagnosis: Acute pain of left knee  Stiffness of left knee, not elsewhere  classified  Difficulty in walking, not elsewhere classified  Localized swelling, mass and lump, left lower limb     Problem List Patient Active Problem List   Diagnosis Date Noted  . Left ACL tear 02/17/2017  . Palpitations 03/06/2016  . Burn 06/01/2013  . Fatigue 09/01/2012  . DUB (dysfunctional uterine bleeding) 09/01/2012  . Allergic reaction to drug 07/13/2012  . 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 RUQ  abdominal pain 03/12/2012  . Nodule on liver   . NONSPECIFIC ABN FINDING RAD & OTH EXAM GI TRACT 12/26/2010  . OBESITY, UNSPECIFIED 12/17/2010  . ANXIETY STATE, UNSPECIFIED 12/17/2010    Bess Harvest, PTA 04/14/17 6:46 PM  [facility] [address]  [place], [place], [address] Phone: [phone]   Fax:  [phone]  Name: Charidy Cappelletti MRN: [id number] Date of Birth: 06/04/1981

## 2017-04-15 ENCOUNTER — Ambulatory Visit: Payer: 59

## 2017-04-15 DIAGNOSIS — R262 Difficulty in walking, not elsewhere classified: Secondary | ICD-10-CM | POA: Diagnosis not present

## 2017-04-15 DIAGNOSIS — M25562 Pain in left knee: Secondary | ICD-10-CM

## 2017-04-15 DIAGNOSIS — M25662 Stiffness of left knee, not elsewhere classified: Secondary | ICD-10-CM

## 2017-04-15 DIAGNOSIS — R2242 Localized swelling, mass and lump, left lower limb: Secondary | ICD-10-CM

## 2017-04-15 NOTE — Therapy (Signed)
[facility] [address]  [place], [place], [address] Phone: [phone]   Fax:  [phone]  Physical Therapy Treatment  Patient Details  Name: Katherine Levine MRN: [id number] Date of Birth: 07/19/1981 Referring Provider: Edmonia Lynch  Encounter Date: 04/15/2017      PT End of Session - 04/15/17 1407    Visit Number 11   Date for PT Re-Evaluation 05/02/17   PT Start Time 9735   PT Stop Time 1453   PT Time Calculation (min) 50 min   Activity Tolerance Patient tolerated treatment well   Behavior During Therapy [facility] for tasks assessed/performed      Past Medical History:  Diagnosis Date  . Dental crowns present    #8,9,25 per pt.  . History of kidney stones   . Left ACL tear 01/2017    Past Surgical History:  Procedure Laterality Date  . CESAREAN SECTION  2012; 2000   x 2  . CHOLECYSTECTOMY     age 39  . DILITATION & CURRETTAGE/HYSTROSCOPY WITH NOVASURE ABLATION  10/19/2012   Procedure: DILATATION & CURETTAGE/HYSTEROSCOPY WITH NOVASURE ABLATION;  Surgeon: Princess Bruins, MD;  Location: [facility];  Service: Gynecology;  Laterality: N/A;  . KNEE ARTHROSCOPY WITH ANTERIOR CRUCIATE LIGAMENT (ACL) REPAIR Left 02/19/2017   Procedure: LEFT KNEE ARTHROSCOPY WITH ANTERIOR CRUCIATE LIGAMENT (ACL) REPAIR ALLOGRAFT;  Surgeon: Renette Butters, MD;  Location: [facility];  Service: Orthopedics;  Laterality: Left;  . LIVER RESECTION  06/2011  . TUBAL LIGATION      There were no vitals filed for this visit.      Subjective Assessment - 04/15/17 1405    Subjective Pt. doing well today noting some benefit from taping.    Currently in Pain? Yes   Pain Score 2    Pain Location Knee   Pain Orientation Left   Pain Descriptors / Indicators Aching;Dull   Pain Type Surgical pain   Pain Onset More than a month ago   Pain Frequency Constant   Aggravating Factors  with sleeping needs to frequently  reposition   Multiple Pain Sites No                         [facility] Adult PT Treatment/Exercise - 04/15/17 1415      Self-Care   Self-Care Other Self-Care Comments   Other Self-Care Comments  Instruction on self-rolling for ITB massage to decrease sensitivity      Knee/Hip Exercises: Stretches   Ambulance person reps;30 seconds   Other Knee/Hip Stretches L ITB stretch with strap 2 x 30 sec      Knee/Hip Exercises: Aerobic   Recumbent Bike L3 x 6 minutes     Knee/Hip Exercises: Standing   Hip Flexion Stengthening;Left;20 reps;Knee straight   Hip Flexion Limitations red tband   Terminal Knee Extension Limitations TKE with blue TB closed in door 5" x 15 reps   Hip ADduction --   Hip ADduction Limitations --   Hip Abduction Stengthening;Left;20 reps;Knee straight   Abduction Limitations red tband    Hip Extension Stengthening;Left;20 reps;Knee straight   Extension Limitations red tband   Functional Squat 15 reps  constant verbal cueing for proper hold times    Functional Squat Limitations at counter; 5" hold;    Wall Squat 15 reps;5 seconds  Constant verbal cueing for correct hold times   Wall Squat Limitations with ball squeeze  Knee/Hip Exercises: Supine   Bridges with Diona Foley Squeeze Both;Strengthening;1 set;20 reps   Straight Leg Raises Strengthening;Left;2 sets;20 reps   Straight Leg Raises Limitations 2#; 2nd set without weight focusing on quad set before rep with heel on 1/2 bolster     Knee/Hip Exercises: Sidelying   Hip ABduction Strengthening;Left;15 reps;1 set   Hip ABduction Limitations 2#      Manual Therapy   Manual Therapy Soft tissue mobilization   Manual therapy comments R sidelying L ITB STM due to pt. report of "burning" in mid ITB                 PT Education - 04/15/17 1502    Education provided Yes   Education Details Hip abduction, extension, flexion with red looped TB issued to pt.   Person(s) Educated Patient    Methods Explanation;Demonstration;Verbal cues;Handout   Comprehension Verbalized understanding;Returned demonstration;Verbal cues required;Need further instruction          PT Short Term Goals - 03/25/17 1125      PT SHORT TERM GOAL #1   Title independent iwth initial HEP   Time 2   Period Weeks   Status On-going           PT Long Term Goals - 04/14/17 1444      PT LONG TERM GOAL #1   Title Walk without device, all distances and surfaces with minimal deviation   Time 12   Period Weeks   Status On-going     PT LONG TERM GOAL #2   Title decrease pain 50%   Status Achieved     PT LONG TERM GOAL #3   Title increase AROM to 0-120 degrees flexion   Time 12   Period Weeks   Status On-going  5.15.18:  4-118 dg      PT LONG TERM GOAL #4   Title increase strength to 4+/5   Status On-going               Plan - 04/15/17 1408    Clinical Impression Statement Pt. doing well today with some benefit noted from taping to L knee with less, "catching" pain incidents since application in yesterday's treatment.  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  Treatment focusing on various quad strengthening activities to improve quad stability and patellar tracking.  Pt. still with notable delayed activation and lag with SLR, and admits to inconsistent HEP adherence.  Pt. tolerating increased reps and sets of strengthening activity today however required constant encouragement from therapist to perform activities with proper hold times and pacing.  Some STM to L mid ITB due to report of, "burning" pain in this area.  Good relief from manual work to this area. HEP updated to include hip 3-way SLR with red TB issued to pt.  Pt. ending treatment pain free however reporting some fatigue at L LE.  Pt. to return to therapy at 1x/wk frequency due to work requirements.  Will monitor HEP adherence and update as appropriate in coming visits.     PT  Treatment/Interventions ADLs/Self Care Home Management;Cryotherapy;Electrical Stimulation;Gait training;Stair training;Functional mobility training;Patient/family education;Neuromuscular re-education;Therapeutic exercise;Balance training;Therapeutic activities;Manual techniques;Vasopneumatic Device   PT Next Visit Plan Monitor HEP adherence and update prn; quad strengthening for improved patellar tracking; taping if benefit noted; hip/knee strengthening activities; follow ACL protocol      Patient will benefit from skilled therapeutic intervention in order to improve the following deficits and impairments:  Abnormal gait, Decreased  activity tolerance, Decreased balance, Decreased mobility, Decreased range of motion, Decreased scar mobility, Decreased strength, Increased edema, Difficulty walking, Pain  Visit Diagnosis: Acute pain of left knee  Stiffness of left knee, not elsewhere classified  Difficulty in walking, not elsewhere classified  Localized swelling, mass and lump, left lower limb     Problem List Patient Active Problem List   Diagnosis Date Noted  . Left ACL tear 02/17/2017  . Palpitations 03/06/2016  . Burn 06/01/2013  . Fatigue 09/01/2012  . DUB (dysfunctional uterine bleeding) 09/01/2012  . Allergic reaction to drug 07/13/2012  . 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 RUQ abdominal pain 03/12/2012  . Nodule on liver   . NONSPECIFIC ABN FINDING RAD & OTH EXAM GI TRACT 12/26/2010  . OBESITY, UNSPECIFIED 12/17/2010  . ANXIETY STATE, UNSPECIFIED 12/17/2010    Bess Harvest, PTA 04/15/17 3:25 PM  [facility] [address]  [place], [place], [address] Phone: [phone]   Fax:  [phone]  Name: Katherine Levine MRN: [id number] Date of Birth: 04/03/1981

## 2017-04-24 ENCOUNTER — Ambulatory Visit: Payer: 59 | Admitting: Physical Therapy

## 2017-04-24 DIAGNOSIS — M25562 Pain in left knee: Secondary | ICD-10-CM | POA: Diagnosis not present

## 2017-04-24 DIAGNOSIS — R2242 Localized swelling, mass and lump, left lower limb: Secondary | ICD-10-CM | POA: Diagnosis not present

## 2017-04-24 DIAGNOSIS — R262 Difficulty in walking, not elsewhere classified: Secondary | ICD-10-CM

## 2017-04-24 DIAGNOSIS — M25662 Stiffness of left knee, not elsewhere classified: Secondary | ICD-10-CM | POA: Diagnosis not present

## 2017-04-24 NOTE — Therapy (Signed)
[facility] [address]  [place], [place], [address] Phone: [phone]   Fax:  [phone]  Physical Therapy Treatment  Patient Details  Name: Katherine Levine MRN: [id number] Date of Birth: 06/15/1981 Referring Provider: Edmonia Lynch  Encounter Date: 04/24/2017      PT End of Session - 04/24/17 1017    Visit Number 12   Date for PT Re-Evaluation 05/02/17   PT Start Time 1010   PT Stop Time 1059   PT Time Calculation (min) 49 min   Activity Tolerance Patient tolerated treatment well   Behavior During Therapy [facility] for tasks assessed/performed      Past Medical History:  Diagnosis Date  . Dental crowns present    #8,9,25 per pt.  . History of kidney stones   . Left ACL tear 01/2017    Past Surgical History:  Procedure Laterality Date  . CESAREAN SECTION  2012; 2000   x 2  . CHOLECYSTECTOMY     age 28  . DILITATION & CURRETTAGE/HYSTROSCOPY WITH NOVASURE ABLATION  10/19/2012   Procedure: DILATATION & CURETTAGE/HYSTEROSCOPY WITH NOVASURE ABLATION;  Surgeon: Princess Bruins, MD;  Location: [facility];  Service: Gynecology;  Laterality: N/A;  . KNEE ARTHROSCOPY WITH ANTERIOR CRUCIATE LIGAMENT (ACL) REPAIR Left 02/19/2017   Procedure: LEFT KNEE ARTHROSCOPY WITH ANTERIOR CRUCIATE LIGAMENT (ACL) REPAIR ALLOGRAFT;  Surgeon: Renette Butters, MD;  Location: [facility];  Service: Orthopedics;  Laterality: Left;  . LIVER RESECTION  06/2011  . TUBAL LIGATION      There were no vitals filed for this visit.      Subjective Assessment - 04/24/17 1012    Subjective Has been really busy at work - now back to full time. walking with a little limp today. Having some difficulty with squats at home - pain with. Reports some locking yesterday.    Patient Stated Goals walk, have normal ROM, no pain   Currently in Pain? Yes   Pain Score 2    Pain Location Knee   Pain Orientation Left   Pain Descriptors /  Indicators Aching   Pain Type Surgical pain                         [facility] Adult PT Treatment/Exercise - 04/24/17 0001      Knee/Hip Exercises: Stretches   Gastroc Stretch Left;3 reps;30 seconds   Gastroc Stretch Limitations blue rocker     Knee/Hip Exercises: Aerobic   Recumbent Bike L3 x 6 minutes     Knee/Hip Exercises: Machines for Strengthening   Cybex Leg Press 20# x 15 reps; 25# x 15 reps     Knee/Hip Exercises: Standing   Heel Raises 15 reps   Heel Raises Limitations L LE eccentric   Step Down Left;2 sets;15 reps;Hand Hold: 2   Step Down Limitations 1 set 8" forward step downs; 1 set 6" lateral step downs - increased effort with lateral   Functional Squat 20 reps   Functional Squat Limitations BOSU - UE support   Wall Squat 15 reps;5 seconds   Wall Squat Limitations with ball squeeze     Knee/Hip Exercises: Supine   Bridges with Greig Right Strengthening;Both;2 sets;15 reps   Straight Leg Raises Strengthening;Left;2 sets;15 reps   Straight Leg Raises Limitations 3#                  PT Short Term Goals - 03/25/17 1125  PT SHORT TERM GOAL #1   Title independent iwth initial HEP   Time 2   Period Weeks   Status On-going           PT Long Term Goals - 04/14/17 1444      PT LONG TERM GOAL #1   Title Walk without device, all distances and surfaces with minimal deviation   Time 12   Period Weeks   Status On-going     PT LONG TERM GOAL #2   Title decrease pain 50%   Status Achieved     PT LONG TERM GOAL #3   Title increase AROM to 0-120 degrees flexion   Time 12   Period Weeks   Status On-going  5.15.18:  4-118 dg      PT LONG TERM GOAL #4   Title increase strength to 4+/5   Status On-going               Plan - 04/24/17 1055    Clinical Impression Statement Patient well today - some continued anterior/lateral knee pain with some noted swelling. Advised patient to continue icing at this point for pain  adnswelling with good understanding. Good progression of strengthening with most difficulty during eccentric work, however progressing well towards goals.    PT Treatment/Interventions ADLs/Self Care Home Management;Cryotherapy;Electrical Stimulation;Gait training;Stair training;Functional mobility training;Patient/family education;Neuromuscular re-education;Therapeutic exercise;Balance training;Therapeutic activities;Manual techniques;Vasopneumatic Device   PT Next Visit Plan Monitor HEP adherence and update prn; quad strengthening for improved patellar tracking; taping if benefit noted; hip/knee strengthening activities; follow ACL protocol   Consulted and Agree with Plan of Care Patient      Patient will benefit from skilled therapeutic intervention in order to improve the following deficits and impairments:  Abnormal gait, Decreased activity tolerance, Decreased balance, Decreased mobility, Decreased range of motion, Decreased scar mobility, Decreased strength, Increased edema, Difficulty walking, Pain  Visit Diagnosis: Acute pain of left knee  Stiffness of left knee, not elsewhere classified  Difficulty in walking, not elsewhere classified  Localized swelling, mass and lump, left lower limb     Problem List Patient Active Problem List   Diagnosis Date Noted  . Left ACL tear 02/17/2017  . Palpitations 03/06/2016  . Burn 06/01/2013  . Fatigue 09/01/2012  . DUB (dysfunctional uterine bleeding) 09/01/2012  . Allergic reaction to drug 07/13/2012  . 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 RUQ abdominal pain 03/12/2012  . Nodule on liver   . NONSPECIFIC ABN FINDING RAD & OTH EXAM GI TRACT 12/26/2010  . OBESITY, UNSPECIFIED 12/17/2010  . ANXIETY STATE, UNSPECIFIED 12/17/2010    Lanney Gins, PT, DPT 04/24/17 11:05 AM   [facility] [address]  [place], [place], [address] Phone: [phone]   Fax:  [phone]  Name:  Katherine Levine MRN: [id number] Date of Birth: [birth year]

## 2017-05-05 ENCOUNTER — Ambulatory Visit: Payer: 59 | Attending: Orthopedic Surgery | Admitting: Physical Therapy

## 2017-05-05 DIAGNOSIS — R262 Difficulty in walking, not elsewhere classified: Secondary | ICD-10-CM | POA: Diagnosis not present

## 2017-05-05 DIAGNOSIS — M25662 Stiffness of left knee, not elsewhere classified: Secondary | ICD-10-CM | POA: Insufficient documentation

## 2017-05-05 DIAGNOSIS — M25562 Pain in left knee: Secondary | ICD-10-CM | POA: Insufficient documentation

## 2017-05-05 DIAGNOSIS — R2242 Localized swelling, mass and lump, left lower limb: Secondary | ICD-10-CM | POA: Insufficient documentation

## 2017-05-05 NOTE — Therapy (Signed)
[facility] [address]  [place], [place], [address] Phone: [phone]   Fax:  [phone]  Physical Therapy Treatment  Patient Details  Name: Katherine Levine MRN: [id number] Date of Birth: 06-16-1981 Referring Provider: Edmonia Lynch  Encounter Date: 05/05/2017      PT End of Session - 05/05/17 1403    Visit Number 13   Date for PT Re-Evaluation 06/30/17   PT Start Time 1400   PT Stop Time 1445   PT Time Calculation (min) 45 min   Activity Tolerance Patient tolerated treatment well   Behavior During Therapy [facility] for tasks assessed/performed      Past Medical History:  Diagnosis Date  . Dental crowns present    #8,9,25 per pt.  . History of kidney stones   . Left ACL tear 01/2017    Past Surgical History:  Procedure Laterality Date  . CESAREAN SECTION  2012; 2000   x 2  . CHOLECYSTECTOMY     age 66  . DILITATION & CURRETTAGE/HYSTROSCOPY WITH NOVASURE ABLATION  10/19/2012   Procedure: DILATATION & CURETTAGE/HYSTEROSCOPY WITH NOVASURE ABLATION;  Surgeon: Princess Bruins, MD;  Location: [facility];  Service: Gynecology;  Laterality: N/A;  . KNEE ARTHROSCOPY WITH ANTERIOR CRUCIATE LIGAMENT (ACL) REPAIR Left 02/19/2017   Procedure: LEFT KNEE ARTHROSCOPY WITH ANTERIOR CRUCIATE LIGAMENT (ACL) REPAIR ALLOGRAFT;  Surgeon: Renette Butters, MD;  Location: [facility];  Service: Orthopedics;  Laterality: Left;  . LIVER RESECTION  06/2011  . TUBAL LIGATION      There were no vitals filed for this visit.      Subjective Assessment - 05/05/17 1402    Subjective patient reporting feeling like knee is hyperextending when walking, as well as some buckling - does not fall. Reports she wears brace while at work   Patient Stated Goals walk, have normal ROM, no pain   Currently in Pain? Yes   Pain Score 2    Pain Location Knee   Pain Orientation Right   Pain Descriptors / Indicators Aching;Sore   Pain  Type Surgical pain            [facility] PT Assessment - 05/05/17 0001      ROM / Strength   AROM / PROM / Strength AROM;Strength     AROM   AROM Assessment Site Knee   Right/Left Knee Left   Left Knee Extension 0   Left Knee Flexion 115     Strength   Strength Assessment Site Hip;Knee   Right/Left Hip Left   Left Hip Flexion 4/5   Left Hip Extension 4+/5   Left Hip Internal Rotation --   Left Hip ABduction 4/5   Left Hip ADduction 4-/5   Right/Left Knee Left   Left Knee Flexion 4/5   Left Knee Extension 4+/5     Palpation   Palpation comment some tenderness to L lateral patella                      [facility] Adult PT Treatment/Exercise - 05/05/17 1411      Knee/Hip Exercises: Aerobic   Nustep L6 x 6 minutes     Knee/Hip Exercises: Machines for Strengthening   Cybex Knee Flexion 25# B LE x 15; 15# L LE eccentric x 15   Cybex Leg Press 25# x 15 reps; 35# x 15 reps     Knee/Hip Exercises: Standing   Step Down Left;15 reps;Hand Hold: 2;Step  Height: 6"   Step Down Limitations lateral, eccentric   Wall Squat 15 reps;5 seconds   Wall Squat Limitations with ball squeeze     Knee/Hip Exercises: Supine   Straight Leg Raises Strengthening;Left;2 sets;15 reps   Straight Leg Raises Limitations 3#     Knee/Hip Exercises: Sidelying   Hip ABduction Strengthening;Left;2 sets;15 reps   Hip ABduction Limitations 3#   Hip ADduction Strengthening;Left;15 reps   Hip ADduction Limitations 3#     Knee/Hip Exercises: Prone   Hamstring Curl 1 set;15 reps   Hamstring Curl Limitations 3#   Hip Extension Strengthening;Left;2 sets;15 reps   Hip Extension Limitations 3#     Manual Therapy   Manual Therapy Taping   Manual therapy comments L anterior knee to promote proper patellar tracking                  PT Short Term Goals - 05/05/17 1403      PT SHORT TERM GOAL #1   Title independent iwth initial HEP   Status Achieved           PT Long Term Goals -  05/05/17 1404      PT LONG TERM GOAL #1   Title Walk without device, all distances and surfaces with minimal deviation   Status On-going     PT LONG TERM GOAL #2   Title decrease pain 50%   Status Achieved     PT LONG TERM GOAL #3   Title increase AROM to 0-120 degrees flexion   Status On-going     PT LONG TERM GOAL #4   Title increase strength to 4+/5   Status On-going     PT LONG TERM GOAL #5   Title understand and perform RICE at home   Status Achieved               Plan - 05/05/17 1446    Clinical Impression Statement Patient presenting to PT today with complaints of intermittent feelings of buckling while at work as well as some popping and pain of L knee. Patient progressing strengthening and ROM well, but does continue to demonstrate slight extensor lag with weighted SLR requiring continued work. Poor eccentric mechanics noted at L knee with step downs likely contributing to feelings of weakness and buckling. Is starting to present with slight patellar tracking issues, therefore taping reapplied today along with education on emphasis on continued compliance with HEP with good understanding. Will plan to extend plan of care at this time to allow for improved strength, ROM, and functional mobility.    PT Treatment/Interventions ADLs/Self Care Home Management;Cryotherapy;Electrical Stimulation;Gait training;Stair training;Functional mobility training;Patient/family education;Neuromuscular re-education;Therapeutic exercise;Balance training;Therapeutic activities;Manual techniques;Vasopneumatic Device   PT Next Visit Plan  quad strengthening for improved patellar tracking; taping if benefit noted; hip/knee strengthening activities; follow ACL protocol   Consulted and Agree with Plan of Care Patient      Patient will benefit from skilled therapeutic intervention in order to improve the following deficits and impairments:  Abnormal gait, Decreased activity tolerance, Decreased  balance, Decreased mobility, Decreased range of motion, Decreased scar mobility, Decreased strength, Increased edema, Difficulty walking, Pain  Visit Diagnosis: Acute pain of left knee - Plan: PT plan of care cert/re-cert  Stiffness of left knee, not elsewhere classified - Plan: PT plan of care cert/re-cert  Difficulty in walking, not elsewhere classified - Plan: PT plan of care cert/re-cert     Problem List Patient Active Problem List   Diagnosis Date Noted  .  Left ACL tear 02/17/2017  . Palpitations 03/06/2016  . Burn 06/01/2013  . Fatigue 09/01/2012  . DUB (dysfunctional uterine bleeding) 09/01/2012  . Allergic reaction to drug 07/13/2012  . 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 RUQ abdominal pain 03/12/2012  . Nodule on liver   . NONSPECIFIC ABN FINDING RAD & OTH EXAM GI TRACT 12/26/2010  . OBESITY, UNSPECIFIED 12/17/2010  . ANXIETY STATE, UNSPECIFIED 12/17/2010     Lanney Gins, PT, DPT 05/05/17 2:55 PM   [facility] [address]  [place], [place], [address] Phone: [phone]   Fax:  [phone]  Name: Katherine Levine MRN: [id number] Date of Birth: 10-11-1981

## 2017-05-15 ENCOUNTER — Ambulatory Visit: Payer: 59 | Admitting: Physical Therapy

## 2017-05-15 DIAGNOSIS — R262 Difficulty in walking, not elsewhere classified: Secondary | ICD-10-CM | POA: Diagnosis not present

## 2017-05-15 DIAGNOSIS — M25662 Stiffness of left knee, not elsewhere classified: Secondary | ICD-10-CM | POA: Diagnosis not present

## 2017-05-15 DIAGNOSIS — M25562 Pain in left knee: Secondary | ICD-10-CM | POA: Diagnosis not present

## 2017-05-15 DIAGNOSIS — R2242 Localized swelling, mass and lump, left lower limb: Secondary | ICD-10-CM | POA: Diagnosis not present

## 2017-05-15 NOTE — Therapy (Signed)
[facility] [address]  [place], [place], [address] Phone: [phone]   Fax:  [phone]  Physical Therapy Treatment  Patient Details  Name: Katherine Levine MRN: [id number] Date of Birth: [birth year] Referring Provider: Edmonia Lynch  Encounter Date: 05/15/2017      PT End of Session - 05/15/17 0957    Visit Number 14   Date for PT Re-Evaluation 06/30/17   PT Start Time 0932   PT Stop Time 1027   PT Time Calculation (min) 55 min   Activity Tolerance Patient tolerated treatment well   Behavior During Therapy [facility] for tasks assessed/performed      Past Medical History:  Diagnosis Date  . Dental crowns present    #8,9,25 per pt.  . History of kidney stones   . Left ACL tear 01/2017    Past Surgical History:  Procedure Laterality Date  . CESAREAN SECTION  2012; 2000   x 2  . CHOLECYSTECTOMY     age 48  . DILITATION & CURRETTAGE/HYSTROSCOPY WITH NOVASURE ABLATION  10/19/2012   Procedure: DILATATION & CURETTAGE/HYSTEROSCOPY WITH NOVASURE ABLATION;  Surgeon: Princess Bruins, MD;  Location: [facility];  Service: Gynecology;  Laterality: N/A;  . KNEE ARTHROSCOPY WITH ANTERIOR CRUCIATE LIGAMENT (ACL) REPAIR Left 02/19/2017   Procedure: LEFT KNEE ARTHROSCOPY WITH ANTERIOR CRUCIATE LIGAMENT (ACL) REPAIR ALLOGRAFT;  Surgeon: Renette Butters, MD;  Location: [facility];  Service: Orthopedics;  Laterality: Left;  . LIVER RESECTION  06/2011  . TUBAL LIGATION      There were no vitals filed for this visit.      Subjective Assessment - 05/15/17 0955    Subjective patient feels like her knee brace causes her to wlak with a limp - does not wear brace to PT   Patient Stated Goals walk, have normal ROM, no pain   Currently in Pain? Yes   Pain Score 2    Pain Location Knee   Pain Orientation Right   Pain Descriptors / Indicators Aching;Tightness;Sore   Pain Type Surgical pain            [facility]  PT Assessment - 05/15/17 0001      AROM   AROM Assessment Site Knee   Right/Left Knee Left   Left Knee Extension 0   Left Knee Flexion 116                     [facility] Adult PT Treatment/Exercise - 05/15/17 0001      Knee/Hip Exercises: Aerobic   Recumbent Bike L3 x 6 minutes     Knee/Hip Exercises: Machines for Strengthening   Cybex Leg Press 25# B LE x 10; 25# L LE eccentric x 15 reps     Knee/Hip Exercises: Standing   Terminal Knee Extension Limitations TKE with blue TB closed in door 5" x 15 reps   Forward Step Up Left;15 reps;Hand Hold: 0;Step Height: 8"   Forward Step Up Limitations 5# at ankle   Step Down Left;2 sets   Step Down Limitations anterior 8" - poor control; lateral 6" - poor control   Wall Squat 15 reps;5 seconds   Wall Squat Limitations with ball squeeze - heavy VC to slow movements     Knee/Hip Exercises: Seated   Long Arc Quad Strengthening;Left;2 sets;15 reps;Weights   Long Arc Quad Weight 5 lbs.   Long [employer] Limitations with adduction ball squeeze     Knee/Hip Exercises: Supine  Other Supine Knee/Hip Exercises straight leg bridge on peanut ball x 10; straight leg bridge with HS curl on peant x 10     Vasopneumatic   Number Minutes Vasopneumatic  15 minutes   Vasopnuematic Location  Knee   Vasopneumatic Pressure High   Vasopneumatic Temperature  mid range temp                  PT Short Term Goals - 05/05/17 1403      PT SHORT TERM GOAL #1   Title independent iwth initial HEP   Status Achieved           PT Long Term Goals - 05/15/17 1019      PT LONG TERM GOAL #1   Title Walk without device, all distances and surfaces with minimal deviation   Status Achieved     PT LONG TERM GOAL #2   Title decrease pain 50%   Status Achieved     PT LONG TERM GOAL #3   Title increase AROM to 0-120 degrees flexion   Status On-going     PT LONG TERM GOAL #4   Title increase strength to 4+/5   Status On-going     PT LONG  TERM GOAL #5   Title understand and perform RICE at home   Status Achieved     Additional Long Term Goals   Additional Long Term Goals Yes     PT LONG TERM GOAL #6   Title patient to demonstrate ability to reciprocally ascend/descend 1 flight of step with minimal deviation and good quad control    Status New               Plan - 05/15/17 0957    Clinical Impression Statement Patient continuing to report L knee pain as well as feelings of "locking up" - reports wearing brace causes her to walk with a limp. Discussion to wear brace if she feels instability (buckling) or general fatigue which may lead to poor gait mechanics. Patient has self-reduced PT frequency to 1x/week due to work constraints, with PT unsure of complaince with HEP. Does continue to demonstrate poor eccentric control of L knee with anterior and lateral step downs with heavy reliance on UE use. Some patellofemoral symptoms arising with anterior knee pain primary concern - able to improve some of these symptoms with increased VMO strengthening as well as taping. Patient tending to heavily use momentum to perform LE strengthening activiites with heavy VC to slow movements to facilitate good motor control.    PT Treatment/Interventions ADLs/Self Care Home Management;Cryotherapy;Electrical Stimulation;Gait training;Stair training;Functional mobility training;Patient/family education;Neuromuscular re-education;Therapeutic exercise;Balance training;Therapeutic activities;Manual techniques;Vasopneumatic Device   PT Next Visit Plan  quad strengthening for improved patellar tracking; taping if benefit noted; hip/knee strengthening activities; follow ACL protocol      Patient will benefit from skilled therapeutic intervention in order to improve the following deficits and impairments:  Abnormal gait, Decreased activity tolerance, Decreased balance, Decreased mobility, Decreased range of motion, Decreased scar mobility, Decreased  strength, Increased edema, Difficulty walking, Pain  Visit Diagnosis: Acute pain of left knee  Stiffness of left knee, not elsewhere classified  Difficulty in walking, not elsewhere classified  Localized swelling, mass and lump, left lower limb     Problem List Patient Active Problem List   Diagnosis Date Noted  . Left ACL tear 02/17/2017  . Palpitations 03/06/2016  . Burn 06/01/2013  . Fatigue 09/01/2012  . DUB (dysfunctional uterine bleeding) 09/01/2012  . Allergic reaction to  drug 07/13/2012  . 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 RUQ abdominal pain 03/12/2012  . Nodule on liver   . NONSPECIFIC ABN FINDING RAD & OTH EXAM GI TRACT 12/26/2010  . OBESITY, UNSPECIFIED 12/17/2010  . ANXIETY STATE, UNSPECIFIED 12/17/2010     Lanney Gins, PT, DPT 05/15/17 10:28 AM   [facility] [address]  [place], [place], [address] Phone: [phone]   Fax:  [phone]  Name: Katherine Levine MRN: [id number] Date of Birth: 11/02/1981

## 2017-05-20 ENCOUNTER — Ambulatory Visit: Payer: 59 | Admitting: Physical Therapy

## 2017-05-20 DIAGNOSIS — S83512D Sprain of anterior cruciate ligament of left knee, subsequent encounter: Secondary | ICD-10-CM | POA: Diagnosis not present

## 2017-05-20 DIAGNOSIS — R2242 Localized swelling, mass and lump, left lower limb: Secondary | ICD-10-CM | POA: Diagnosis not present

## 2017-05-20 DIAGNOSIS — R262 Difficulty in walking, not elsewhere classified: Secondary | ICD-10-CM

## 2017-05-20 DIAGNOSIS — M25662 Stiffness of left knee, not elsewhere classified: Secondary | ICD-10-CM

## 2017-05-20 DIAGNOSIS — M25562 Pain in left knee: Secondary | ICD-10-CM

## 2017-05-20 NOTE — Therapy (Signed)
[facility] [address]  [place], [place], [address] Phone: [phone]   Fax:  [phone]  Physical Therapy Treatment  Patient Details  Name: Katherine Levine MRN: [id number] Date of Birth: [birth year] Referring Provider: Edmonia Lynch  Encounter Date: 05/20/2017      PT End of Session - 05/20/17 1451    Visit Number 15   Date for PT Re-Evaluation 06/30/17   PT Start Time 1447   PT Stop Time 1526   PT Time Calculation (min) 39 min   Activity Tolerance Patient tolerated treatment well   Behavior During Therapy [facility] for tasks assessed/performed      Past Medical History:  Diagnosis Date  . Dental crowns present    #8,9,25 per pt.  . History of kidney stones   . Left ACL tear 01/2017    Past Surgical History:  Procedure Laterality Date  . CESAREAN SECTION  2012; 2000   x 2  . CHOLECYSTECTOMY     age 67  . DILITATION & CURRETTAGE/HYSTROSCOPY WITH NOVASURE ABLATION  10/19/2012   Procedure: DILATATION & CURETTAGE/HYSTEROSCOPY WITH NOVASURE ABLATION;  Surgeon: Princess Bruins, MD;  Location: [facility];  Service: Gynecology;  Laterality: N/A;  . KNEE ARTHROSCOPY WITH ANTERIOR CRUCIATE LIGAMENT (ACL) REPAIR Left 02/19/2017   Procedure: LEFT KNEE ARTHROSCOPY WITH ANTERIOR CRUCIATE LIGAMENT (ACL) REPAIR ALLOGRAFT;  Surgeon: Renette Butters, MD;  Location: [facility];  Service: Orthopedics;  Laterality: Left;  . LIVER RESECTION  06/2011  . TUBAL LIGATION      There were no vitals filed for this visit.      Subjective Assessment - 05/20/17 1450    Subjective patient in flip flops today - continues to have knee pain; went swimming over the weekend and feels like the buoyancy hurt her knee - had some clicking   Patient Stated Goals walk, have normal ROM, no pain   Currently in Pain? Yes   Pain Score 2    Pain Location Knee   Pain Orientation Right   Pain Descriptors / Indicators  Aching;Tightness;Sore   Pain Type Surgical pain                         [facility] Adult PT Treatment/Exercise - 05/20/17 0001      Knee/Hip Exercises: Aerobic   Recumbent Bike L3 x 6 minutes     Knee/Hip Exercises: Machines for Strengthening   Cybex Leg Press 35# B LE con; 35# B con/L ecc x 15      Knee/Hip Exercises: Standing   Other Standing Knee Exercises side stepping 30 feet each direction - green tband; fowd monster walks 30 feet x 2      Knee/Hip Exercises: Seated   Long Arc Quad Strengthening;Left;15 reps;Weights   Long Arc Quad Weight 5 lbs.   Long [employer] Limitations with adduction ball squeeze   Hamstring Curl Strengthening;Left;1 set;15 reps   Hamstring Limitations blue tband     Knee/Hip Exercises: Supine   Bridges with [employer] Strengthening;Both;15 reps   Straight Leg Raise with External Rotation Strengthening;Left;2 sets;15 reps   Straight Leg Raise with External Rotation Limitations 3#                  PT Short Term Goals - 05/05/17 1403      PT SHORT TERM GOAL #1   Title independent iwth initial HEP   Status Achieved  PT Long Term Goals - 05/15/17 1019      PT LONG TERM GOAL #1   Title Walk without device, all distances and surfaces with minimal deviation   Status Achieved     PT LONG TERM GOAL #2   Title decrease pain 50%   Status Achieved     PT LONG TERM GOAL #3   Title increase AROM to 0-120 degrees flexion   Status On-going     PT LONG TERM GOAL #4   Title increase strength to 4+/5   Status On-going     PT LONG TERM GOAL #5   Title understand and perform RICE at home   Status Achieved     Additional Long Term Goals   Additional Long Term Goals Yes     PT LONG TERM GOAL #6   Title patient to demonstrate ability to reciprocally ascend/descend 1 flight of step with minimal deviation and good quad control    Status New               Plan - 05/20/17 1623    Clinical Impression  Statement Patient with MD follow-up today. PT and patient discussing talking with MD on current restriction as far as impact goeas as well as continued knee pain patient is experiencing. Patient doing well with all resistance training with some pain during session, however, not limiting tolerance to activity. WIll continue to progress as tolerated and able in protocol.    PT Treatment/Interventions ADLs/Self Care Home Management;Cryotherapy;Electrical Stimulation;Gait training;Stair training;Functional mobility training;Patient/family education;Neuromuscular re-education;Therapeutic exercise;Balance training;Therapeutic activities;Manual techniques;Vasopneumatic Device   PT Next Visit Plan  quad strengthening for improved patellar tracking; taping if benefit noted; hip/knee strengthening activities; follow ACL protocol   Consulted and Agree with Plan of Care Patient      Patient will benefit from skilled therapeutic intervention in order to improve the following deficits and impairments:  Abnormal gait, Decreased activity tolerance, Decreased balance, Decreased mobility, Decreased range of motion, Decreased scar mobility, Decreased strength, Increased edema, Difficulty walking, Pain  Visit Diagnosis: Acute pain of left knee  Stiffness of left knee, not elsewhere classified  Difficulty in walking, not elsewhere classified  Localized swelling, mass and lump, left lower limb     Problem List Patient Active Problem List   Diagnosis Date Noted  . Left ACL tear 02/17/2017  . Palpitations 03/06/2016  . Burn 06/01/2013  . Fatigue 09/01/2012  . DUB (dysfunctional uterine bleeding) 09/01/2012  . Allergic reaction to drug 07/13/2012  . 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 RUQ abdominal pain 03/12/2012  . Nodule on liver   . NONSPECIFIC ABN FINDING RAD & OTH EXAM GI TRACT 12/26/2010  . OBESITY, UNSPECIFIED 12/17/2010  . ANXIETY STATE, UNSPECIFIED 12/17/2010     Lanney Gins, PT, DPT 05/20/17  4:33 PM   [facility] [address]  [place], [place], [address] Phone: [phone]   Fax:  [phone]  Name: Syona Wroblewski MRN: [id number] Date of Birth: 1981/09/11

## 2017-05-27 ENCOUNTER — Ambulatory Visit: Payer: 59 | Admitting: Physical Therapy

## 2017-05-27 DIAGNOSIS — R2242 Localized swelling, mass and lump, left lower limb: Secondary | ICD-10-CM

## 2017-05-27 DIAGNOSIS — M25562 Pain in left knee: Secondary | ICD-10-CM

## 2017-05-27 DIAGNOSIS — R262 Difficulty in walking, not elsewhere classified: Secondary | ICD-10-CM

## 2017-05-27 DIAGNOSIS — M25662 Stiffness of left knee, not elsewhere classified: Secondary | ICD-10-CM | POA: Diagnosis not present

## 2017-05-27 NOTE — Therapy (Signed)
[facility] [address]  [place], [place], [address] Phone: [phone]   Fax:  [phone]  Physical Therapy Treatment  Patient Details  Name: Katherine Levine MRN: [id number] Date of Birth: [birth year] Referring Provider: Edmonia Lynch  Encounter Date: 05/27/2017      PT End of Session - 05/27/17 1459    Visit Number 16   Date for PT Re-Evaluation 06/30/17   PT Start Time 1450   PT Stop Time 1530   PT Time Calculation (min) 40 min   Activity Tolerance Patient tolerated treatment well   Behavior During Therapy [facility] for tasks assessed/performed      Past Medical History:  Diagnosis Date  . Dental crowns present    #8,9,25 per pt.  . History of kidney stones   . Left ACL tear 01/2017    Past Surgical History:  Procedure Laterality Date  . CESAREAN SECTION  2012; 2000   x 2  . CHOLECYSTECTOMY     age 89  . DILITATION & CURRETTAGE/HYSTROSCOPY WITH NOVASURE ABLATION  10/19/2012   Procedure: DILATATION & CURETTAGE/HYSTEROSCOPY WITH NOVASURE ABLATION;  Surgeon: Princess Bruins, MD;  Location: [facility];  Service: Gynecology;  Laterality: N/A;  . KNEE ARTHROSCOPY WITH ANTERIOR CRUCIATE LIGAMENT (ACL) REPAIR Left 02/19/2017   Procedure: LEFT KNEE ARTHROSCOPY WITH ANTERIOR CRUCIATE LIGAMENT (ACL) REPAIR ALLOGRAFT;  Surgeon: Renette Butters, MD;  Location: [facility];  Service: Orthopedics;  Laterality: Left;  . LIVER RESECTION  06/2011  . TUBAL LIGATION      There were no vitals filed for this visit.      Subjective Assessment - 05/27/17 1455    Subjective patient seen by MD last week - given brace for patellar tracking - however, patient reports allergic reaction to brace, so unable to wear brace. No clearance on impact activities.    Patient Stated Goals walk, have normal ROM, no pain   Currently in Pain? Yes   Pain Score 2    Pain Location Knee   Pain Orientation Right   Pain  Descriptors / Indicators Aching;Tightness   Pain Type Surgical pain                         [facility] Adult PT Treatment/Exercise - 05/27/17 1508      Knee/Hip Exercises: Aerobic   Recumbent Bike L3 x 6 minutes     Knee/Hip Exercises: Machines for Strengthening   Cybex Knee Flexion 30# B LE x 15; 20# B con/L ecc x 15    Cybex Leg Press 45# B LE x 15 reps     Knee/Hip Exercises: Standing   Step Down Left;15 reps;Hand Hold: 0;Step Height: 6"   Step Down Limitations anterior    Functional Squat 15 reps   Functional Squat Limitations BOSU   Wall Squat 15 reps;5 seconds   Wall Squat Limitations with ball squeeze     Knee/Hip Exercises: Seated   Long Arc Quad Strengthening;Left;15 reps;Weights   Long Arc Quad Weight 4 lbs.   Long [employer] Limitations with adduction ball squeeze     Knee/Hip Exercises: Supine   Straight Leg Raises Strengthening;Left;15 reps   Straight Leg Raises Limitations 4#   Straight Leg Raise with External Rotation Strengthening;Left;15 reps   Straight Leg Raise with External Rotation Limitations 4#   Other Supine Knee/Hip Exercises bridge with HS curl on peanut ball x 15 reps  PT Short Term Goals - 05/05/17 1403      PT SHORT TERM GOAL #1   Title independent iwth initial HEP   Status Achieved           PT Long Term Goals - 05/15/17 1019      PT LONG TERM GOAL #1   Title Walk without device, all distances and surfaces with minimal deviation   Status Achieved     PT LONG TERM GOAL #2   Title decrease pain 50%   Status Achieved     PT LONG TERM GOAL #3   Title increase AROM to 0-120 degrees flexion   Status On-going     PT LONG TERM GOAL #4   Title increase strength to 4+/5   Status On-going     PT LONG TERM GOAL #5   Title understand and perform RICE at home   Status Achieved     Additional Long Term Goals   Additional Long Term Goals Yes     PT LONG TERM GOAL #6   Title patient to demonstrate  ability to reciprocally ascend/descend 1 flight of step with minimal deviation and good quad control    Status New               Plan - 05/27/17 1459    Clinical Impression Statement Patient seen by MD last week - given horseshoe knee brace to improve patellar tracking, however, patient reporting inability to wear brace due to allergic reaction. Patient report good compliance with HEP, however, little progress seen with forward step downs from 6" step. Patient with continued restrictions for impact activities.    PT Treatment/Interventions ADLs/Self Care Home Management;Cryotherapy;Electrical Stimulation;Gait training;Stair training;Functional mobility training;Patient/family education;Neuromuscular re-education;Therapeutic exercise;Balance training;Therapeutic activities;Manual techniques;Vasopneumatic Device   PT Next Visit Plan  quad strengthening for improved patellar tracking; taping if benefit noted; hip/knee strengthening activities; follow ACL protocol   Consulted and Agree with Plan of Care Patient      Patient will benefit from skilled therapeutic intervention in order to improve the following deficits and impairments:  Abnormal gait, Decreased activity tolerance, Decreased balance, Decreased mobility, Decreased range of motion, Decreased scar mobility, Decreased strength, Increased edema, Difficulty walking, Pain  Visit Diagnosis: Acute pain of left knee  Stiffness of left knee, not elsewhere classified  Difficulty in walking, not elsewhere classified  Localized swelling, mass and lump, left lower limb     Problem List Patient Active Problem List   Diagnosis Date Noted  . Left ACL tear 02/17/2017  . Palpitations 03/06/2016  . Burn 06/01/2013  . Fatigue 09/01/2012  . DUB (dysfunctional uterine bleeding) 09/01/2012  . Allergic reaction to drug 07/13/2012  . 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 RUQ abdominal pain 03/12/2012  . Nodule on liver   . NONSPECIFIC ABN FINDING  RAD & OTH EXAM GI TRACT 12/26/2010  . OBESITY, UNSPECIFIED 12/17/2010  . ANXIETY STATE, UNSPECIFIED 12/17/2010    Lanney Gins, PT, DPT 05/27/17 [facility] [address]  [place], [place], [address] Phone: [phone]   Fax:  [phone]  Name: Katherine Levine MRN: [id number] Date of Birth: 1981/11/13

## 2017-06-10 ENCOUNTER — Ambulatory Visit: Payer: 59 | Admitting: Physical Therapy

## 2017-06-11 ENCOUNTER — Ambulatory Visit: Payer: 59 | Attending: Orthopedic Surgery

## 2017-06-11 DIAGNOSIS — R262 Difficulty in walking, not elsewhere classified: Secondary | ICD-10-CM | POA: Diagnosis not present

## 2017-06-11 DIAGNOSIS — M25662 Stiffness of left knee, not elsewhere classified: Secondary | ICD-10-CM | POA: Insufficient documentation

## 2017-06-11 DIAGNOSIS — R2242 Localized swelling, mass and lump, left lower limb: Secondary | ICD-10-CM | POA: Insufficient documentation

## 2017-06-11 DIAGNOSIS — M25562 Pain in left knee: Secondary | ICD-10-CM | POA: Insufficient documentation

## 2017-06-11 NOTE — Therapy (Addendum)
[facility] [address]  [place], [place], [address] Phone: [phone]   Fax:  [phone]  Physical Therapy Treatment  Patient Details  Name: Katherine Levine MRN: [id number] Date of Birth: 06/27/1981 Referring Provider: Edmonia Lynch  Encounter Date: 06/11/2017      PT End of Session - 06/11/17 1711    Visit Number 17   Date for PT Re-Evaluation 06/30/17   PT Start Time 2330  pt. arrived late    PT Stop Time 1745   PT Time Calculation (min) 40 min   Activity Tolerance Patient tolerated treatment well   Behavior During Therapy [facility] for tasks assessed/performed      Past Medical History:  Diagnosis Date  . Dental crowns present    #8,9,25 per pt.  . History of kidney stones   . Left ACL tear 01/2017    Past Surgical History:  Procedure Laterality Date  . CESAREAN SECTION  2012; 2000   x 2  . CHOLECYSTECTOMY     age 56  . DILITATION & CURRETTAGE/HYSTROSCOPY WITH NOVASURE ABLATION  10/19/2012   Procedure: DILATATION & CURETTAGE/HYSTEROSCOPY WITH NOVASURE ABLATION;  Surgeon: Princess Bruins, MD;  Location: [facility];  Service: Gynecology;  Laterality: N/A;  . KNEE ARTHROSCOPY WITH ANTERIOR CRUCIATE LIGAMENT (ACL) REPAIR Left 02/19/2017   Procedure: LEFT KNEE ARTHROSCOPY WITH ANTERIOR CRUCIATE LIGAMENT (ACL) REPAIR ALLOGRAFT;  Surgeon: Renette Butters, MD;  Location: [facility];  Service: Orthopedics;  Laterality: Left;  . LIVER RESECTION  06/2011  . TUBAL LIGATION      There were no vitals filed for this visit.      Subjective Assessment - 06/11/17 1710    Subjective Pt. doing well.  Still having some knee pain descending stairs.     Patient Stated Goals walk, have normal ROM, no pain   Currently in Pain? No/denies   Pain Score 0-No pain   Multiple Pain Sites No            [facility] PT Assessment - 06/11/17 1719      Assessment   Next MD Visit 7.20.18     AROM   AROM  Assessment Site Knee   Right/Left Knee Left   Left Knee Extension 0   Left Knee Flexion 122     Strength   Strength Assessment Site Hip;Knee   Right/Left Hip Left   Left Hip Flexion 4/5   Left Hip Extension 4+/5   Left Hip ABduction 4+/5   Left Hip ADduction 4+/5   Right/Left Knee Left   Left Knee Flexion 4+/5   Left Knee Extension 4/5                     [facility] Adult PT Treatment/Exercise - 06/11/17 1717      Ambulation/Gait   Stairs Yes   Stairs Assistance 6: Modified independent (Device/Increase time)   Stair Management Technique --  cues required for alternating pattern   Number of Stairs 12   Height of Stairs 8   Gait Comments Pt. able to ascend/descend stairs x 12 with 1 rail use however still with poor quad control with eccentric lowering phase     Knee/Hip Exercises: Stretches   Gastroc Stretch Left;30 seconds;1 rep   Gastroc Stretch Limitations blue rocker     Knee/Hip Exercises: Aerobic   Recumbent Bike L3 x 6 minutes     Knee/Hip Exercises: Machines for Strengthening   Cybex Knee Flexion 35#  B LE x 15; 25# B con/L ecc x 15      Knee/Hip Exercises: Standing   Step Down Left;15 reps;Step Height: 8";Hand Hold: 1   Step Down Limitations anterior    Other Standing Knee Exercises side stepping, monster walk with green TB x 30 ft each way     Knee/Hip Exercises: Supine   Straight Leg Raises Strengthening;Left;15 reps   Straight Leg Raises Limitations 4#   Straight Leg Raise with External Rotation Strengthening;Left;15 reps   Straight Leg Raise with External Rotation Limitations 4#     Knee/Hip Exercises: Sidelying   Hip ABduction Strengthening;Left;15 reps;1 set   Hip ABduction Limitations 3#                PT Education - 06/11/17 1759    Education provided Yes   Education Details 8" eccentric step down    Person(s) Educated Patient   Methods Explanation;Demonstration;Verbal cues;Handout   Comprehension Verbalized  understanding;Returned demonstration;Verbal cues required;Need further instruction          PT Short Term Goals - 05/05/17 1403      PT SHORT TERM GOAL #1   Title independent iwth initial HEP   Status Achieved           PT Long Term Goals - 06/11/17 1732      PT LONG TERM GOAL #1   Title Walk without device, all distances and surfaces with minimal deviation   Status Achieved     PT LONG TERM GOAL #2   Title decrease pain 50%   Status Achieved     PT LONG TERM GOAL #3   Title increase AROM to 0-120 degrees flexion   Status Achieved     PT LONG TERM GOAL #4   Title increase strength to 4+/5   Status Partially Met  7.12.18: L knee extension and hip flexion still 4/5.     PT LONG TERM GOAL #5   Title understand and perform RICE at home   Status Achieved     PT LONG TERM GOAL #6   Title patient to demonstrate ability to reciprocally ascend/descend 1 flight of step with minimal deviation and good quad control    Status On-going  7.12.18: L quad still with poor control while descending.               Plan - 06/11/17 1731    Clinical Impression Statement Pt. doing well today able to demo improved LE strength and AROM at L knee.  L knee AROM 0-122 today meeting ROM goal.  Pt. able to partially meet strength goal with L hip flexion and L knee extension still 4/5.  Pt. still demonstrating limited L quad eccentric control with descending stair with heavy rail use still.  Pt. admitting to poor HEP adherence however HEP updated with more strengthening activities today.  Pt. starting new job next week and wishes to wait on new work schedule before scheduling further therapy appointments.  Pt. will continue to benefit from further skilled therapy to maximize LE strength and quad control to improved functional ability and stair navigation.     PT Treatment/Interventions ADLs/Self Care Home Management;Cryotherapy;Electrical Stimulation;Gait training;Stair training;Functional  mobility training;Patient/family education;Neuromuscular re-education;Therapeutic exercise;Balance training;Therapeutic activities;Manual techniques;Vasopneumatic Device   PT Next Visit Plan  quad strengthening for improved patellar tracking; taping if benefit noted; hip/knee strengthening activities; follow ACL protocol      Patient will benefit from skilled therapeutic intervention in order to improve the following deficits and impairments:  Abnormal gait, Decreased activity tolerance, Decreased balance, Decreased mobility, Decreased range of motion, Decreased scar mobility, Decreased strength, Increased edema, Difficulty walking, Pain  Visit Diagnosis: Acute pain of left knee  Stiffness of left knee, not elsewhere classified  Difficulty in walking, not elsewhere classified  Localized swelling, mass and lump, left lower limb     Problem List Patient Active Problem List   Diagnosis Date Noted  . Left ACL tear 02/17/2017  . Palpitations 03/06/2016  . Burn 06/01/2013  . Fatigue 09/01/2012  . DUB (dysfunctional uterine bleeding) 09/01/2012  . Allergic reaction to drug 07/13/2012  . 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 RUQ abdominal pain 03/12/2012  . Nodule on liver   . NONSPECIFIC ABN FINDING RAD & OTH EXAM GI TRACT 12/26/2010  . OBESITY, UNSPECIFIED 12/17/2010  . ANXIETY STATE, UNSPECIFIED 12/17/2010    Bess Harvest, PTA 06/11/17 6:03 PM   PHYSICAL THERAPY DISCHARGE SUMMARY  Visits from Start of Care: 17  Current functional level related to goals / functional outcomes: See above   Remaining deficits: See above; some intermittent anterior knee pain   Education / Equipment: HEP  Plan: Patient agrees to discharge.  Patient goals were partially met. Patient is being discharged due to not returning since the last visit.  ?????     Lanney Gins, PT, DPT 07/15/17 3:06 PM  [facility] [address]  [place], [place], [address] Phone: [phone]   Fax:  [phone]  Name: Katherine Levine MRN: [id number] Date of Birth: 08/21/1981

## 2017-08-20 ENCOUNTER — Telehealth: Payer: 59 | Admitting: Family

## 2017-08-20 DIAGNOSIS — J029 Acute pharyngitis, unspecified: Secondary | ICD-10-CM | POA: Diagnosis not present

## 2017-08-20 MED ORDER — PREDNISONE 5 MG PO TABS: 5.0000 mg | ORAL_TABLET | ORAL | 0 refills | Status: DC

## 2017-08-20 MED ORDER — BENZONATATE 100 MG PO CAPS: 100.0000 mg | ORAL_CAPSULE | Freq: Three times a day (TID) | ORAL | 0 refills | Status: DC | PRN

## 2017-08-20 NOTE — Progress Notes (Signed)
Thank you for the details you put in the comment boxes. Those details really help [place] take better care of you.   We are sorry that you are not feeling well.  Here is how we plan to help!  Based on your presentation I believe you most likely have A cough due to a virus.  This is called viral bronchitis and is best treated by rest, plenty of fluids and control of the cough.  You may use Ibuprofen or Tylenol as directed to help your symptoms.     In addition you may use A non-prescription cough medication called Mucinex DM: take 2 tablets every 12 hours. and A prescription cough medication called Tessalon Perles 100mg . You may take 1-2 capsules every 8 hours as needed for your cough.  Sterapred 5 mg dosepak  From your responses in the eVisit questionnaire you describe inflammation in the upper respiratory tract which is causing a significant cough.  This is commonly called Bronchitis and has four common causes:    Allergies  Viral Infections  Acid Reflux  Bacterial Infection Allergies, viruses and acid reflux are treated by controlling symptoms or eliminating the cause. An example might be a cough caused by taking certain blood pressure medications. You stop the cough by changing the medication. Another example might be a cough caused by acid reflux. Controlling the reflux helps control the cough.  USE OF BRONCHODILATOR ("RESCUE") INHALERS: There is a risk from using your bronchodilator too frequently.  The risk is that over-reliance on a medication which only relaxes the muscles surrounding the breathing tubes can reduce the effectiveness of medications prescribed to reduce swelling and congestion of the tubes themselves.  Although you feel brief relief from the bronchodilator inhaler, your asthma may actually be worsening with the tubes becoming more swollen and filled with mucus.  This can delay other crucial treatments, such as oral steroid medications. If you need to use a bronchodilator  inhaler daily, several times per day, you should discuss this with your provider.  There are probably better treatments that could be used to keep your asthma under control.     HOME CARE . Only take medications as instructed by your medical team. . Complete the entire course of an antibiotic. . Drink plenty of fluids and get plenty of rest. . Avoid close contacts especially the very young and the elderly . Cover your mouth if you cough or cough into your sleeve. . Always remember to wash your hands . A steam or ultrasonic humidifier can help congestion.   GET HELP RIGHT AWAY IF: . You develop worsening fever. . You become short of breath . You cough up blood. . Your symptoms persist after you have completed your treatment plan MAKE SURE YOU   Understand these instructions.  Will watch your condition.  Will get help right away if you are not doing well or get worse.  Your e-visit answers were reviewed by a board certified advanced clinical practitioner to complete your personal care plan.  Depending on the condition, your plan could have included both over the counter or prescription medications. If there is a problem please reply  once you have received a response from your provider. Your safety is important to [place].  If you have drug allergies check your prescription carefully.    You can use MyChart to ask questions about today's visit, request a non-urgent call back, or ask for a work or school excuse for 24 hours related to this e-Visit. If  it has been greater than 24 hours you will need to follow up with your provider, or enter a new e-Visit to address those concerns. You will get an e-mail in the next two days asking about your experience.  I hope that your e-visit has been valuable and will speed your recovery. Thank you for using e-visits.

## 2018-02-16 DIAGNOSIS — R9089 Other abnormal findings on diagnostic imaging of central nervous system: Secondary | ICD-10-CM

## 2018-02-16 HISTORY — DX: Other abnormal findings on diagnostic imaging of central nervous system: R90.89

## 2018-05-12 ENCOUNTER — Encounter: Payer: Self-pay | Admitting: Neurology

## 2018-05-12 ENCOUNTER — Encounter

## 2018-05-12 ENCOUNTER — Ambulatory Visit (INDEPENDENT_AMBULATORY_CARE_PROVIDER_SITE_OTHER): Payer: 59 | Admitting: Neurology

## 2018-05-12 ENCOUNTER — Other Ambulatory Visit: Payer: Self-pay

## 2018-05-12 VITALS — BP 145/99 | HR 69 | Resp 18 | Ht 66.0 in | Wt 207.0 lb

## 2018-05-12 DIAGNOSIS — Q283 Other malformations of cerebral vessels: Secondary | ICD-10-CM | POA: Diagnosis not present

## 2018-05-12 DIAGNOSIS — E538 Deficiency of other specified B group vitamins: Secondary | ICD-10-CM

## 2018-05-12 DIAGNOSIS — R5383 Other fatigue: Secondary | ICD-10-CM

## 2018-05-12 DIAGNOSIS — R413 Other amnesia: Secondary | ICD-10-CM

## 2018-05-12 DIAGNOSIS — H539 Unspecified visual disturbance: Secondary | ICD-10-CM | POA: Insufficient documentation

## 2018-05-12 HISTORY — DX: Unspecified visual disturbance: H53.9

## 2018-05-12 HISTORY — DX: Deficiency of other specified B group vitamins: E53.8

## 2018-05-12 HISTORY — DX: Other malformations of cerebral vessels: Q28.3

## 2018-05-12 HISTORY — DX: Other amnesia: R41.3

## 2018-05-12 NOTE — Progress Notes (Signed)
[facility]  PATIENT: Katherine Levine DOB: 15-Mar-1981  REFERRING DOCTOR OR PCP:   Addison Naegeli, MD SOURCE: Patient, notes from Dr. Benjamine Mola and Dr. Katherine Roan, imaging reports, MRI images on PACS.  _________________________________   HISTORICAL  CHIEF COMPLAINT:  Chief Complaint  Patient presents with  . Memory Loss    Sts. she has intermittent difficulty remembering responses to people duriing conversations.  Also sts. she intermittently blurry vision, usually brief and clears up.  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  Has also seen NS (Dr. Katherine Roan), and opthalmology, has been referred to a retina specialist/fim  . Decreased Visual Acuity    HISTORY OF PRESENT ILLNESS:  I had the pleasure seeing your patient, Katherine Levine, at [facility] for neurologic consultation regarding her memory loss, visual changes and abnormal brain MRI  She has always been very active.   About 4-5 months ago,  she began to notice more difficulty with focus.   She had trouble coming up with her address one time.   Another time, she had trouble reading a word.   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    Her optometrist felt she had some visual problems.    She notes some mild color problems.    Her VA was reportedly close to 20/20 in both eyes and she did not need glasses.     Often her vision seems worse in the morning and then clears up in the afternoon.   She also noted twitching of the right eye and she feel her eyelids are asymmetric.     She works 50 hours working 9 am to 8 pm most days and 1/2 days on Fridays and Sundays.    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      She has some insomnia with trouble falling asleep.    Some mornings, she is not refreshed (2-3 times a week).   She does not snore.   She had a PSG in the past and was told  she did not have OSA.   Her weight is about the same since that test.     She gets some cramps in her legs, left > right.    Moving around makes the cramp better.  Most are in her calf and she needs to jump up to stretch it.    She gets 7-8 hours of sleep most nights and 6 on a bad night (if she has insomnia).     She has one x nocturia.      She has a h/o B12 deficiency (level was 120) and was on an oral supplementation for a while.    She denies depression, irritability or anxiety.       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   She had an infection as a complication.    She also notes a patch of hair missing overt he past few months.      I personally reviewed the MRI of the brain without contrast as well as the MRI of the brain with contrast.  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   This could represent a cavernous angioma..  The rest of the  brain was normal.  Due to the abnormal brain MRI, she was referred to Dr. Katherine Roan who did not recommend any intervention.  REVIEW OF SYSTEMS: Constitutional: No fevers, chills, sweats, or change in appetite Eyes: No visual changes, double vision, eye pain Ear, nose and throat: No hearing loss, ear pain, nasal congestion, sore throat Cardiovascular: No chest pain, palpitations Respiratory: No shortness of breath at rest or with exertion.   No wheezes GastrointestinaI: No nausea, vomiting, diarrhea, abdominal pain, fecal incontinence Genitourinary: No dysuria, urinary retention or frequency.  No nocturia. Musculoskeletal: No neck pain, back pain Integumentary: No rash, pruritus, skin lesions Neurological: as above Psychiatric: No depression at this time.  No anxiety Endocrine: No palpitations, diaphoresis, change in appetite, change in weigh or increased thirst Hematologic/Lymphatic: No anemia,  purpura, petechiae. Allergic/Immunologic: No itchy/runny eyes, nasal congestion, recent allergic reactions, rashes  ALLERGIES: Allergies  Allergen Reactions  . Amoxicillin Itching and Nausea And Vomiting  . Azithromycin Itching and Nausea And Vomiting  . Fexofenadine Itching and Nausea And Vomiting  . Sertraline Other (See Comments)    TREMORS  . Adhesive [Tape] Rash  . Ciprofloxacin Rash  . Meperidine Hcl Itching and Rash  . Penicillins Itching    HOME MEDICATIONS:  Current Outpatient Medications:  .  cetirizine (ZYRTEC) 10 MG chewable tablet, Chew 10 mg by mouth daily., Disp: , Rfl:  .  fluticasone (FLONASE) 50 MCG/ACT nasal spray, Place into both nostrils daily., Disp: , Rfl:   PAST MEDICAL HISTORY: Past Medical History:  Diagnosis Date  . Dental crowns present    #8,9,25 per pt.  . History of kidney stones   . Left ACL tear 01/2017  . Vision abnormalities     PAST SURGICAL HISTORY: Past Surgical History:  Procedure Laterality Date  . CESAREAN SECTION  2012; 2000   x 2  . CHOLECYSTECTOMY     age 22  . DILITATION & CURRETTAGE/HYSTROSCOPY WITH NOVASURE ABLATION  10/19/2012   Procedure: DILATATION & CURETTAGE/HYSTEROSCOPY WITH NOVASURE ABLATION;  Surgeon: Princess Bruins, MD;  Location: [facility];  Service: Gynecology;  Laterality: N/A;  . KNEE ARTHROSCOPY WITH ANTERIOR CRUCIATE LIGAMENT (ACL) REPAIR Left 02/19/2017   Procedure: LEFT KNEE ARTHROSCOPY WITH ANTERIOR CRUCIATE LIGAMENT (ACL) REPAIR ALLOGRAFT;  Surgeon: Renette Butters, MD;  Location: [facility];  Service: Orthopedics;  Laterality: Left;  . LIVER RESECTION  06/2011  . TUBAL LIGATION      FAMILY HISTORY: Family History  Problem Relation Age of Onset  . Cancer Mother        thyroid cancer  . Diabetes Mother   . Heart disease Mother   . Hypertension Mother   . Stroke Mother   . Melanoma Father     SOCIAL HISTORY:  Social History   Socioeconomic History  . Marital status: Married      Spouse name: Not on file  . Number of children: Not on file  . Years of education: Not on file  . Highest education level: Not on file  Occupational History  . Not on file  Social Needs  . Financial resource strain: Not on file  . Food insecurity:    Worry: Not on file    Inability: Not on file  . Transportation needs:    Medical: Not on file    Non-medical: Not on file  Tobacco Use  . Smoking status: Never Smoker  . Smokeless tobacco: Never Used  Substance and Sexual Activity  . Alcohol use: Yes    Alcohol/week:  0.0 oz    Comment: occasionally  . Drug use: No  . Sexual activity: Yes    Birth control/protection: Surgical  Lifestyle  . Physical activity:    Days per week: Not on file    Minutes per session: Not on file  . Stress: Not on file  Relationships  . Social connections:    Talks on phone: Not on file    Gets together: Not on file    Attends religious service: Not on file    Active member of club or organization: Not on file    Attends meetings of clubs or organizations: Not on file    Relationship status: Not on file  . Intimate partner violence:    Fear of current or ex partner: Not on file    Emotionally abused: Not on file    Physically abused: Not on file    Forced sexual activity: Not on file  Other Topics Concern  . Not on file  Social History Narrative  . Not on file     PHYSICAL EXAM  Vitals:   05/12/18 1027  BP: (!) 145/99  Pulse: 69  Resp: 18  Weight: 207 lb (93.9 kg)  Height: 5\' 6"  (1.676 m)    Body mass index is 33.41 kg/m.   General: The patient is well-developed and well-nourished and in no acute distress  Eyes:  Funduscopic exam shows normal optic discs and retinal vessels.  Neck: The neck is supple, no carotid bruits are noted.  The neck is nontender.  Cardiovascular: The heart has a regular rate and rhythm with a normal S1 and S2. There were no murmurs, gallops or rubs. Lungs are clear to auscultation.  Skin:  Extremities are without significant edema.  Musculoskeletal:  Back is nontender  Neurologic Exam  Mental status: The patient is alert and oriented x 3 at the time of the examination. The patient has apparent normal recent and remote memory, with an apparently normal attention span and concentration ability.   Speech is normal.  Cranial nerves: Extraocular movements are full. Pupils are equal, round, and reactive to light and accomodation.  Visual fields are full.  Facial symmetry is present. There is good facial sensation to soft touch bilaterally.Facial strength is normal.  Trapezius and sternocleidomastoid strength is normal. No dysarthria is noted.  The tongue is midline, and the patient has symmetric elevation of the soft palate. No obvious hearing deficits are noted.  Motor:  Muscle bulk is normal.   Tone is normal. Strength is  5 / 5 in all 4 extremities.   Sensory: Sensory testing is intact to pinprick, soft touch and vibration sensation in all 4 extremities.  Coordination: Cerebellar testing reveals good finger-nose-finger and heel-to-shin bilaterally.  Gait and station: Station is normal.   Gait is normal. Tandem gait is normal. Romberg is negative.   Reflexes: Deep tendon reflexes are symmetric and normal bilaterally.   Plantar responses are flexor.    DIAGNOSTIC DATA (LABS, IMAGING, TESTING) - I reviewed patient records, labs, notes, testing and imaging myself where available.  Lab Results  Component Value Date   WBC 8.0 03/12/2014   HGB 15.1 (H) 03/12/2014   HCT 44.1 03/12/2014   MCV 90.4 03/12/2014   PLT 209 03/12/2014      Component Value Date/Time   NA 140 03/12/2014 1400   K 4.3 03/12/2014 1400   CL 103 03/12/2014 1400   CO2 25 03/12/2014 1400   GLUCOSE 106 (H) 03/12/2014 1400   BUN  10 03/12/2014 1400   CREATININE 0.90 03/12/2014 1400   CREATININE 0.75 09/01/2012 0940   CALCIUM 9.4 03/12/2014 1400   PROT 7.3 03/12/2014 1400   ALBUMIN 4.4 03/12/2014 1400    AST 14 03/12/2014 1400   ALT 13 03/12/2014 1400   ALKPHOS 72 03/12/2014 1400   BILITOT 0.4 03/12/2014 1400   GFRNONAA 82 (L) 03/12/2014 1400   GFRAA >90 03/12/2014 1400   Lab Results  Component Value Date   LDLDIRECT 73 12/17/2010   No results found for: HGBA1C No results found for: VITAMINB12 Lab Results  Component Value Date   TSH 1.010 09/01/2012       ASSESSMENT AND PLAN  Memory loss - Plan: EEG adult, TSH, Vitamin B12, VITAMIN D 25 Hydroxy (Vit-D Deficiency, Fractures)  B12 deficiency - Plan: TSH, Vitamin B12, VITAMIN D 25 Hydroxy (Vit-D Deficiency, Fractures)  Other fatigue - Plan: TSH, Vitamin B12, VITAMIN D 25 Hydroxy (Vit-D Deficiency, Fractures)  Developmental venous anomaly - Plan: EEG adult  Visual disturbance   In summary, Katherine Levine is a 37 year old woman who has noted difficulties with mild memory loss, fatigue, right visual changes who was noted on brain MRI to have a 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  Her examination is essentially normal.   Because her symptoms have fluctuated some and she has the right frontal lobe abnormality, we will check an EEG to determine if there is any epileptiform activity.   I discussed with her that given her age it is most likely that the cognitive issues are due to another medical disorder, mood disturbance or sleep disturbance.  Alzheimer's or other degenerative disease is unlikely given her age and normal exam.   She has a history of low vitamin E42 and that metabolic derangement could certainly be contributing.  We will check a B12, TSH and vitamin D.    She will also be following up with ophthalmology for her visual symptoms.  We discussed that the abnormality in the brain in the left frontal lobe is unlikely to cause symptoms and intervention is not recommended.   I want to see her back in 6 months to assess the stability of her symptoms.  She is also advised to call us sooner if she  has new or worsening neurologic symptoms.    Thank you for asking me to see Katherine Levine.  Please let me know if I can be of further assistance with her or other patients in the future.   Richard A. Felecia Shelling, MD, PhD, FAAN Certified in Neurology, Clinical Neurophysiology, Sleep Medicine, Pain Medicine and Neuroimaging Director, [place] [facility] [address], [place], [place] [address] [phone]

## 2018-05-13 ENCOUNTER — Telehealth: Payer: Self-pay | Admitting: *Deleted

## 2018-05-13 LAB — VITAMIN D 25 HYDROXY (VIT D DEFICIENCY, FRACTURES): Vit D, 25-Hydroxy: 14.1 ng/mL — ABNORMAL LOW (ref 30.0–100.0)

## 2018-05-13 LAB — TSH: TSH: 1.31 u[IU]/mL (ref 0.450–4.500)

## 2018-05-13 LAB — VITAMIN B12: VITAMIN B 12: 459 pg/mL (ref 232–1245)

## 2018-05-13 MED ORDER — VITAMIN D (ERGOCALCIFEROL) 1.25 MG (50000 UNIT) PO CAPS: 50000.0000 [IU] | ORAL_CAPSULE | ORAL | 1 refills | Status: DC

## 2018-05-13 NOTE — Telephone Encounter (Signed)
-----   Message from Britt Bottom, MD sent at 05/13/2018  3:41 PM EDT ----- Please let her know that the vitamin D was low.  Missed to 50,000 units weekly x6 months.

## 2018-05-13 NOTE — Telephone Encounter (Signed)
Spoke with Katherine Levine and reviewed below lab results, need for rx. Vit. D once wkly for 6 mos. She verbalized understanding of same, is agreeable to rx. Vit. D.  Rx. escribed to [facility] per pt's request/fim

## 2018-06-09 ENCOUNTER — Ambulatory Visit (INDEPENDENT_AMBULATORY_CARE_PROVIDER_SITE_OTHER): Payer: 59 | Admitting: Neurology

## 2018-06-09 DIAGNOSIS — Q283 Other malformations of cerebral vessels: Secondary | ICD-10-CM

## 2018-06-09 DIAGNOSIS — R410 Disorientation, unspecified: Secondary | ICD-10-CM

## 2018-06-09 DIAGNOSIS — R413 Other amnesia: Secondary | ICD-10-CM

## 2018-06-09 NOTE — Progress Notes (Signed)
   [facility]  EEG (ELECTROENCEPHALOGRAM) REPORT   STUDY DATE: 06/09/2018 PATIENT NAME: Katherine Levine DOB: [birth year] MRN: [id number]  ORDERING CLINICIAN: Dr. Felecia Shelling  TECHNOLOGIST: Nonda Lou TECHNIQUE: Electroencephalogram was recorded utilizing standard 10-20 system of lead placement and reformatted into average and bipolar montages.  RECORDING TIME: 23:34  CLINICAL INFORMATION: 37 yo woman with memory loss and transient neurologic symptoms.  FINDINGS: A digital EEG was performed while the patient was awake and drowsy. While awake and most alert there was a 10.5 hz posterior dominant rhythm. Voltages and frequencies were symmetric.  There were no focal, lateralizing, epileptiform activity or seizures seen.  Photic stimulation had a normal driving response. Hyperventilation and recovery did not change the underlying rhythms. EKG channel shows NSR.   She became drowsy but no sleep was recorded.  IMPRESSION: This is a normal EEG while the patient was awake and drowsy.    INTERPRETING PHYSICIAN:   Richard A. Felecia Shelling, MD, PhD, [facility] Certified in Neurology, Clinical Neurophysiology, Sleep Medicine, Pain Medicine and Neuroimaging  [facility] [address] [place], [place] [address] [phone]

## 2018-07-16 ENCOUNTER — Ambulatory Visit (INDEPENDENT_AMBULATORY_CARE_PROVIDER_SITE_OTHER): Payer: 59 | Admitting: Neurology

## 2018-07-16 ENCOUNTER — Encounter: Payer: Self-pay | Admitting: Neurology

## 2018-07-16 ENCOUNTER — Other Ambulatory Visit: Payer: Self-pay

## 2018-07-16 VITALS — BP 139/93 | HR 54 | Resp 18 | Ht 66.0 in | Wt 208.5 lb

## 2018-07-16 DIAGNOSIS — D1800 Hemangioma unspecified site: Secondary | ICD-10-CM

## 2018-07-16 DIAGNOSIS — R5383 Other fatigue: Secondary | ICD-10-CM | POA: Diagnosis not present

## 2018-07-16 DIAGNOSIS — R413 Other amnesia: Secondary | ICD-10-CM | POA: Diagnosis not present

## 2018-07-16 DIAGNOSIS — H539 Unspecified visual disturbance: Secondary | ICD-10-CM | POA: Diagnosis not present

## 2018-07-16 HISTORY — DX: Hemangioma unspecified site: D18.00

## 2018-07-16 MED ORDER — RIZATRIPTAN BENZOATE 10 MG PO TBDP: 10.0000 mg | ORAL_TABLET | ORAL | 11 refills | Status: DC | PRN

## 2018-07-16 NOTE — Progress Notes (Signed)
[facility]  PATIENT: Katherine Levine DOB: 1981-10-27  REFERRING DOCTOR OR PCP:   Addison Naegeli, MD SOURCE: Patient, notes from Dr. Benjamine Mola and Dr. Katherine Roan, imaging reports, MRI images on PACS.  _________________________________   HISTORICAL  CHIEF COMPLAINT:  Chief Complaint  Patient presents with  . Memory Loss    Sts. memory, vision are the same.  Sts. she is now wearing glasses but doesn't feel they help. Has f/u with [facility] next month/fim  . Abnormal MRI Brain    HISTORY OF PRESENT ILLNESS:  Katherine Levine is a 37 yo woman with memory loss, visual changes and abnormal brain MRI  Update 07/16/2018: She continues to report some difficulty with her vision as well as reduced short-term memory.   Since the last visit, she had an EEG which was normal.     She saw a retina specialist  And notes form 05/19/2018 were reviewed.   She reports he said her symptoms may be due to the cavernous angioma and there was some fluid behind the eye.    I don't see mention of that in the report.   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  That could cause a higher risk of retinal detachment.  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   I showed her the images.  It is small and there is no adjacent mass-effect so I do not think it is playing any role in her vision or other neurologic symptoms.  She has migraines one a week.   When one occurs, she takes Tylenol.   Ibuprofen helped more but she is concerned about taking NSAIDs.   She gets photophobia and phonophobia and is worse when she moves her head.    She does not have much nausea and no vomiting.     Her insomnia is doing better.    She is working 40 hours a week instead of 50-60 hours in her new job and it is less stressful.        From 05/12/2018: She has always been very active.   About 4-5 months ago,  she began to notice more difficulty with focus.   She  had trouble coming up with her address one time.   Another time, she had trouble reading a word.   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    Her optometrist felt she had some visual problems.    She notes some mild color problems.    Her VA was reportedly close to 20/20 in both eyes and she did not need glasses.     Often her vision seems worse in the morning and then clears up in the afternoon.   She also noted twitching of the right eye and she feel her eyelids are asymmetric.     She works 50 hours working 9 am to 8 pm most days and 1/2 days on Fridays and Sundays.    She also sometimes teaches at [employer]      She has some insomnia with trouble falling asleep.    Some mornings, she is not refreshed (2-3 times a week).   She does not snore.   She had a PSG in the past and was told she did not have OSA.   Her weight is about the same since that test.  She gets some cramps in her legs, left > right.    Moving around makes the cramp better.  Most are in her calf and she needs to jump up to stretch it.    She gets 7-8 hours of sleep most nights and 6 on a bad night (if she has insomnia).     She has one x nocturia.      She has a h/o B12 deficiency (level was 120) and was on an oral supplementation for a while.    She denies depression, irritability or anxiety.       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   She had an infection as a complication.    She also notes a patch of hair missing overt he past few months.      I personally reviewed the MRI of the brain without contrast as well as the MRI of the brain with contrast.  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   This could represent a cavernous angioma..  The rest of the brain was normal.  Due to the abnormal  brain MRI, she was referred to Dr. Katherine Roan who did not recommend any intervention.  REVIEW OF SYSTEMS: Constitutional: No fevers, chills, sweats, or change in appetite Eyes: No visual changes, double vision, eye pain Ear, nose and throat: No hearing loss, ear pain, nasal congestion, sore throat Cardiovascular: No chest pain, palpitations Respiratory: No shortness of breath at rest or with exertion.   No wheezes GastrointestinaI: No nausea, vomiting, diarrhea, abdominal pain, fecal incontinence Genitourinary: No dysuria, urinary retention or frequency.  No nocturia. Musculoskeletal: No neck pain, back pain Integumentary: No rash, pruritus, skin lesions Neurological: as above Psychiatric: No depression at this time.  No anxiety Endocrine: No palpitations, diaphoresis, change in appetite, change in weigh or increased thirst Hematologic/Lymphatic: No anemia, purpura, petechiae. Allergic/Immunologic: No itchy/runny eyes, nasal congestion, recent allergic reactions, rashes  ALLERGIES: Allergies  Allergen Reactions  . Amoxicillin Itching and Nausea And Vomiting  . Azithromycin Itching and Nausea And Vomiting  . Fexofenadine Itching and Nausea And Vomiting  . Sertraline Other (See Comments)    TREMORS  . Adhesive [Tape] Rash  . Ciprofloxacin Rash  . Meperidine Hcl Itching and Rash  . Penicillins Itching    HOME MEDICATIONS:  Current Outpatient Medications:  .  cetirizine (ZYRTEC) 10 MG chewable tablet, Chew 10 mg by mouth daily., Disp: , Rfl:  .  fluticasone (FLONASE) 50 MCG/ACT nasal spray, Place into both nostrils daily., Disp: , Rfl:  .  Vitamin D, Ergocalciferol, (DRISDOL) 50000 units CAPS capsule, Take 1 capsule (50,000 Units total) by mouth every 7 (seven) days., Disp: 12 capsule, Rfl: 1 .  rizatriptan (MAXALT-MLT) 10 MG disintegrating tablet, Take 1 tablet (10 mg total) by mouth as needed for migraine. May repeat in 2 hours if needed, Disp: 9 tablet, Rfl: 11  PAST  MEDICAL HISTORY: Past Medical History:  Diagnosis Date  . Dental crowns present    #8,9,25 per pt.  . History of kidney stones   . Left ACL tear 01/2017  . Vision abnormalities     PAST SURGICAL HISTORY: Past Surgical History:  Procedure Laterality Date  . CESAREAN SECTION  2012; 2000   x 2  . CHOLECYSTECTOMY     age 49  . [place] ABLATION  10/19/2012  Procedure: DILATATION & CURETTAGE/HYSTEROSCOPY WITH NOVASURE ABLATION;  Surgeon: Princess Bruins, MD;  Location: [facility];  Service: Gynecology;  Laterality: N/A;  . KNEE ARTHROSCOPY WITH ANTERIOR CRUCIATE LIGAMENT (ACL) REPAIR Left 02/19/2017   Procedure: LEFT KNEE ARTHROSCOPY WITH ANTERIOR CRUCIATE LIGAMENT (ACL) REPAIR ALLOGRAFT;  Surgeon: Renette Butters, MD;  Location: [facility];  Service: Orthopedics;  Laterality: Left;  . LIVER RESECTION  06/2011  . TUBAL LIGATION      FAMILY HISTORY: Family History  Problem Relation Age of Onset  . Cancer Mother        thyroid cancer  . Diabetes Mother   . Heart disease Mother   . Hypertension Mother   . Stroke Mother   . Melanoma Father     SOCIAL HISTORY:  Social History   Socioeconomic History  . Marital status: Married    Spouse name: Not on file  . Number of children: Not on file  . Years of education: Not on file  . Highest education level: Not on file  Occupational History  . Not on file  Social Needs  . Financial resource strain: Not on file  . Food insecurity:    Worry: Not on file    Inability: Not on file  . Transportation needs:    Medical: Not on file    Non-medical: Not on file  Tobacco Use  . Smoking status: Never Smoker  . Smokeless tobacco: Never Used  Substance and Sexual Activity  . Alcohol use: Yes    Alcohol/week: 0.0 standard drinks    Comment: occasionally  . Drug use: No  . Sexual activity: Yes    Birth control/protection: Surgical  Lifestyle  . Physical activity:    Days per  week: Not on file    Minutes per session: Not on file  . Stress: Not on file  Relationships  . Social connections:    Talks on phone: Not on file    Gets together: Not on file    Attends religious service: Not on file    Active member of club or organization: Not on file    Attends meetings of clubs or organizations: Not on file    Relationship status: Not on file  . Intimate partner violence:    Fear of current or ex partner: Not on file    Emotionally abused: Not on file    Physically abused: Not on file    Forced sexual activity: Not on file  Other Topics Concern  . Not on file  Social History Narrative  . Not on file     PHYSICAL EXAM  Vitals:   07/16/18 0956  BP: (!) 139/93  Pulse: (!) 54  Resp: 18  Weight: 208 lb 8 oz (94.6 kg)  Height: 5\' 6"  (1.676 m)    Body mass index is 33.65 kg/m.   General: The patient is well-developed and well-nourished and in no acute distress  Eyes:  Funduscopic exam shows normal optic discs and retinal vessels.   Neurologic Exam  Mental status: The patient is alert and oriented x 3 at the time of the examination. The patient has apparent normal recent and remote memory, with an apparently normal attention span and concentration ability.   Speech is normal.  Cranial nerves: Extraocular movements are full.  Facial strength and sensation is normal.  Trapezius strength is normal.. No dysarthria is noted.  The tongue is midline, and the patient has symmetric elevation of the soft palate. No obvious hearing deficits are  noted.  Motor:  Muscle bulk is normal.   Tone is normal. Strength is  5 / 5 in all 4 extremities.   Sensory: She has intact sensation to touch and vibration..  Coordination: Cerebellar testing reveals good finger-nose-finger and heel-to-shin bilaterally.  Gait and station: Station is normal.   Gait is normal. Tandem gait is normal. Romberg is negative.   Reflexes: Deep tendon reflexes are symmetric and normal  bilaterally.       DIAGNOSTIC DATA (LABS, IMAGING, TESTING) - I reviewed patient records, labs, notes, testing and imaging myself where available.  Lab Results  Component Value Date   WBC 8.0 03/12/2014   HGB 15.1 (H) 03/12/2014   HCT 44.1 03/12/2014   MCV 90.4 03/12/2014   PLT 209 03/12/2014      Component Value Date/Time   NA 140 03/12/2014 1400   K 4.3 03/12/2014 1400   CL 103 03/12/2014 1400   CO2 25 03/12/2014 1400   GLUCOSE 106 (H) 03/12/2014 1400   BUN 10 03/12/2014 1400   CREATININE 0.90 03/12/2014 1400   CREATININE 0.75 09/01/2012 0940   CALCIUM 9.4 03/12/2014 1400   PROT 7.3 03/12/2014 1400   ALBUMIN 4.4 03/12/2014 1400   AST 14 03/12/2014 1400   ALT 13 03/12/2014 1400   ALKPHOS 72 03/12/2014 1400   BILITOT 0.4 03/12/2014 1400   GFRNONAA 82 (L) 03/12/2014 1400   GFRAA >90 03/12/2014 1400   Lab Results  Component Value Date   LDLDIRECT 73 12/17/2010   No results found for: HGBA1C Lab Results  Component Value Date   XMIWOEHO12 248 05/12/2018   Lab Results  Component Value Date   TSH 1.310 05/12/2018       ASSESSMENT AND PLAN  Memory loss  Other fatigue  Visual disturbance  Cavernous angioma  1.    Hopefully, some of her symptoms such as memory loss will improve when she changes jobs in a couple weeks.  Will reevaluate in 6 months. 2.   She will follow-up with ophthalmology. 3.    For her migraines, I will prescribe Maxalt as needed. 4.    She will return in 6 months or sooner if there are new or worsening neurologic symptoms.  Shaketta Rill A. Felecia Shelling, MD, PhD, FAAN Certified in Neurology, Clinical Neurophysiology, Sleep Medicine, Pain Medicine and Neuroimaging Director, [facility] [address] [place], [place] [address] [phone]

## 2018-11-19 ENCOUNTER — Ambulatory Visit: Payer: 59 | Admitting: Neurology

## 2019-01-19 ENCOUNTER — Ambulatory Visit: Payer: 59 | Admitting: Neurology

## 2019-01-24 ENCOUNTER — Encounter: Payer: Self-pay | Admitting: Neurology

## 2019-04-12 ENCOUNTER — Telehealth: Payer: Self-pay | Admitting: Neurology

## 2019-04-12 NOTE — Telephone Encounter (Signed)
Emailed pt link. I called pt and confirmed she received email for VV while I had her on the phone.  Updated med list, pharmacy, allergies on file.

## 2019-04-12 NOTE — Addendum Note (Signed)
Addended by: Rossie Muskrat L on: 04/12/2019 12:09 PM   Modules accepted: Orders

## 2019-04-12 NOTE — Telephone Encounter (Signed)
6 month follow up 04-12-19 Pt gave verbal conset to file insurance for [email or web address] VV Pt email address is: [name]  Pt understands that although there may be some limitations with this type of visit, we will take all precautions to reduce any security or privacy concerns.  Pt understands that this will be treated like an in office visit and we will file with pt's insurance, and there may be a patient responsible charge related to this service.

## 2019-04-13 ENCOUNTER — Encounter: Payer: Self-pay | Admitting: Neurology

## 2019-04-13 ENCOUNTER — Other Ambulatory Visit: Payer: Self-pay

## 2019-04-13 ENCOUNTER — Ambulatory Visit (INDEPENDENT_AMBULATORY_CARE_PROVIDER_SITE_OTHER): Payer: 59 | Admitting: Neurology

## 2019-04-13 DIAGNOSIS — H539 Unspecified visual disturbance: Secondary | ICD-10-CM | POA: Diagnosis not present

## 2019-04-13 DIAGNOSIS — D1800 Hemangioma unspecified site: Secondary | ICD-10-CM

## 2019-04-13 DIAGNOSIS — R5383 Other fatigue: Secondary | ICD-10-CM | POA: Diagnosis not present

## 2019-04-13 DIAGNOSIS — R413 Other amnesia: Secondary | ICD-10-CM

## 2019-04-13 DIAGNOSIS — E559 Vitamin D deficiency, unspecified: Secondary | ICD-10-CM

## 2019-04-13 HISTORY — DX: Vitamin D deficiency, unspecified: E55.9

## 2019-04-13 MED ORDER — PHENTERMINE HCL 37.5 MG PO CAPS: 37.5000 mg | ORAL_CAPSULE | ORAL | 5 refills | Status: DC

## 2019-04-13 NOTE — Progress Notes (Signed)
[facility]  PATIENT: Katherine Levine DOB: [birth year]  REFERRING DOCTOR OR PCP:   Addison Naegeli, MD SOURCE: Patient, notes from Dr. Benjamine Mola and Dr. Katherine Roan, imaging reports, MRI images on PACS.  _________________________________   HISTORICAL  CHIEF COMPLAINT:  Chief Complaint  Patient presents with  . Memory Loss  . Other    Visual problems  . Migraine    HISTORY OF PRESENT ILLNESS:  Katherine Levine is a 38 yo woman with memory loss, visual changes and abnormal brain MRI  Update 04/13/2019 Virtual Visit via Video Note I connected with Gershon Crane on 04/13/19 at 11:00 AM EDT by a video enabled telemedicine application and verified that I am speaking with the correct person.  I discussed the limitations of evaluation and management by telemedicine and the availability of in person appointments. The patient expressed understanding and agreed to proceed.  History of Present Illness: Migraines have done very well.  She reports only one migraine since the last visit.   Rizatriptan worked very well to eliminate the pain and other symptoms.  She is noting more issues with cognition.  She has mild memory issues and also gets letters mixed up --- like confusing B and D with typing and reading.  Focus is poor.    She is sleeping better.     She has lost 42 pounds by diet and exercise.   She had snoring but PSG a few years ago did not show OSA by report.     Vit D was only 14.1 in mid 2019.      Observations/Objective:  VS  122/73, pulse 74, 97.7 She is a well-developed well-nourished woman in no acute distress.  The head is normocephalic and atraumatic.  Sclera are anicteric.  Visible skin appears normal.  The neck has a good range of motion.  She is alert and fully oriented with fluent speech and good attention, knowledge and memory.  Extraocular muscles are intact.  Facial strength is normal.    She appears to have normal strength in the arms.  Rapid  alternating movements and finger-nose-finger are performed well.   Assessment and Plan: Memory loss  Other fatigue  Visual disturbance  Cavernous angioma  Vitamin D deficiency   1.   Etiology of her memory loss is unclear but is unlikely to be neurodegenerative based on the appearance of the MRI.  More likely, it is due to decreased focus and attention.  We could consider a stimulant.  She also is trying to lose additional weight.  I will have her try phentermine 37.5 mg as it can help attention deficit as well as weight loss. 2.  She needs to continue to take vitamin D.  She can take 5000 units daily. 3.   Continue as needed Maxalt for migraines  4.   return in 6 months or sooner if there are new or worsening neurologic symptoms.  Follow Up Instructions: I discussed the assessment and treatment plan with the patient. The patient was provided an opportunity to ask questions and all were answered. The patient agreed with the plan and demonstrated an understanding of the instructions.    The patient was advised to call back or seek an in-person evaluation if the symptoms worsen or if the condition fails to improve as anticipated.  I provided 25 minutes of non-face-to-face time during this encounter.  __________________________________ From previous visits: Update 07/16/2018: She continues to report some difficulty with her vision as well as reduced short-term  memory.   Since the last visit, she had an EEG which was normal.     She saw a retina specialist  And notes form 05/19/2018 were reviewed.   She reports he said her symptoms may be due to the cavernous angioma and there was some fluid behind the eye.    I don't see mention of that in the report.   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  That could cause a higher risk of retinal detachment.  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   I showed her the images.  It is small and there is no  adjacent mass-effect so I do not think it is playing any role in her vision or other neurologic symptoms.  She has migraines one a week.   When one occurs, she takes Tylenol.   Ibuprofen helped more but she is concerned about taking NSAIDs.   She gets photophobia and phonophobia and is worse when she moves her head.    She does not have much nausea and no vomiting.     Her insomnia is doing better.    She is working 40 hours a week instead of 50-60 hours in her new job and it is less stressful.        From 05/12/2018: She has always been very active.   About 4-5 months ago,  she began to notice more difficulty with focus.   She had trouble coming up with her address one time.   Another time, she had trouble reading a word.   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    Her optometrist felt she had some visual problems.    She notes some mild color problems.    Her VA was reportedly close to 20/20 in both eyes and she did not need glasses.     Often her vision seems worse in the morning and then clears up in the afternoon.   She also noted twitching of the right eye and she feel her eyelids are asymmetric.     She works 50 hours working 9 am to 8 pm most days and 1/2 days on Fridays and Sundays.    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      She has some insomnia with trouble falling asleep.    Some mornings, she is not refreshed (2-3 times a week).   She does not snore.   She had a PSG in the past and was told she did not have OSA.   Her weight is about the same since that test.     She gets some cramps in her legs, left > right.    Moving around makes the cramp better.  Most are in her calf and she needs to jump up to stretch it.    She gets 7-8 hours of sleep most nights and 6 on a bad night (if she has insomnia).     She has one x nocturia.      She has a h/o B12 deficiency (level was 120) and was on an oral  supplementation for a while.    She denies depression, irritability or anxiety.       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   She had  an infection as a complication.    She also notes a patch of hair missing overt he past few months.      I personally reviewed the MRI of the brain without contrast as well as the MRI of the brain with contrast.  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   This could represent a cavernous angioma..  The rest of the brain was normal.  Due to the abnormal brain MRI, she was referred to Dr. Katherine Roan who did not recommend any intervention.  REVIEW OF SYSTEMS: Constitutional: No fevers, chills, sweats, or change in appetite Eyes: No visual changes, double vision, eye pain Ear, nose and throat: No hearing loss, ear pain, nasal congestion, sore throat Cardiovascular: No chest pain, palpitations Respiratory: No shortness of breath at rest or with exertion.   No wheezes GastrointestinaI: No nausea, vomiting, diarrhea, abdominal pain, fecal incontinence Genitourinary: No dysuria, urinary retention or frequency.  No nocturia. Musculoskeletal: No neck pain, back pain Integumentary: No rash, pruritus, skin lesions Neurological: as above Psychiatric: No depression at this time.  No anxiety Endocrine: No palpitations, diaphoresis, change in appetite, change in weigh or increased thirst Hematologic/Lymphatic: No anemia, purpura, petechiae. Allergic/Immunologic: No itchy/runny eyes, nasal congestion, recent allergic reactions, rashes  ALLERGIES: Allergies  Allergen Reactions  . Amoxicillin Itching and Nausea And Vomiting  . Azithromycin Itching and Nausea And Vomiting  . Fexofenadine Itching and Nausea And Vomiting  . Sertraline Other (See Comments)    TREMORS  . Adhesive [Tape] Rash  . Ciprofloxacin Rash  .  Meperidine Hcl Itching and Rash  . Penicillins Itching    HOME MEDICATIONS:  Current Outpatient Medications:  .  cetirizine (ZYRTEC) 10 MG chewable tablet, Chew 10 mg by mouth daily., Disp: , Rfl:  .  fluticasone (FLONASE) 50 MCG/ACT nasal spray, Place into both nostrils as needed. , Disp: , Rfl:  .  phentermine 37.5 MG capsule, Take 1 capsule (37.5 mg total) by mouth every morning., Disp: 30 capsule, Rfl: 5 .  rizatriptan (MAXALT-MLT) 10 MG disintegrating tablet, Take 1 tablet (10 mg total) by mouth as needed for migraine. May repeat in 2 hours if needed, Disp: 9 tablet, Rfl: 11  PAST MEDICAL HISTORY: Past Medical History:  Diagnosis Date  . Dental crowns present    #8,9,25 per pt.  . History of kidney stones   . Left ACL tear 01/2017  . Vision abnormalities     PAST SURGICAL HISTORY: Past Surgical History:  Procedure Laterality Date  . CESAREAN SECTION  2012; 2000   x 2  . CHOLECYSTECTOMY     age 1  . DILITATION & CURRETTAGE/HYSTROSCOPY WITH NOVASURE ABLATION  10/19/2012   Procedure: DILATATION & CURETTAGE/HYSTEROSCOPY WITH NOVASURE ABLATION;  Surgeon: Princess Bruins, MD;  Location: [facility];  Service: Gynecology;  Laterality: N/A;  . KNEE ARTHROSCOPY WITH ANTERIOR CRUCIATE LIGAMENT (ACL) REPAIR Left 02/19/2017   Procedure: LEFT KNEE ARTHROSCOPY WITH ANTERIOR CRUCIATE LIGAMENT (ACL) REPAIR ALLOGRAFT;  Surgeon: Renette Butters, MD;  Location: [facility];  Service: Orthopedics;  Laterality: Left;  . LIVER RESECTION  06/2011  . TUBAL LIGATION      FAMILY HISTORY: Family History  Problem Relation Age of Onset  . Cancer Mother        thyroid cancer  . Diabetes Mother   . Heart disease Mother   . Hypertension Mother   . Stroke Mother   . Melanoma Father  SOCIAL HISTORY:  Social History   Socioeconomic History  . Marital status: Married    Spouse name: Not on file  . Number of children: Not on file  . Years of education: Not on file  . Highest  education level: Not on file  Occupational History  . Not on file  Social Needs  . Financial resource strain: Not on file  . Food insecurity:    Worry: Not on file    Inability: Not on file  . Transportation needs:    Medical: Not on file    Non-medical: Not on file  Tobacco Use  . Smoking status: Never Smoker  . Smokeless tobacco: Never Used  Substance and Sexual Activity  . Alcohol use: Yes    Alcohol/week: 0.0 standard drinks    Comment: occasionally  . Drug use: No  . Sexual activity: Yes    Birth control/protection: Surgical  Lifestyle  . Physical activity:    Days per week: Not on file    Minutes per session: Not on file  . Stress: Not on file  Relationships  . Social connections:    Talks on phone: Not on file    Gets together: Not on file    Attends religious service: Not on file    Active member of club or organization: Not on file    Attends meetings of clubs or organizations: Not on file    Relationship status: Not on file  . Intimate partner violence:    Fear of current or ex partner: Not on file    Emotionally abused: Not on file    Physically abused: Not on file    Forced sexual activity: Not on file  Other Topics Concern  . Not on file  Social History Narrative  . Not on file     PHYSICAL EXAM  There were no vitals filed for this visit.  There is no height or weight on file to calculate BMI.   General: The patient is well-developed and well-nourished and in no acute distress  Eyes:  Funduscopic exam shows normal optic discs and retinal vessels.   Neurologic Exam  Mental status: The patient is alert and oriented x 3 at the time of the examination. The patient has apparent normal recent and remote memory, with an apparently normal attention span and concentration ability.   Speech is normal.  Cranial nerves: Extraocular movements are full.  Facial strength and sensation is normal.  Trapezius strength is normal.. No dysarthria is noted.  The  tongue is midline, and the patient has symmetric elevation of the soft palate. No obvious hearing deficits are noted.  Motor:  Muscle bulk is normal.   Tone is normal. Strength is  5 / 5 in all 4 extremities.   Sensory: She has intact sensation to touch and vibration..  Coordination: Cerebellar testing reveals good finger-nose-finger and heel-to-shin bilaterally.  Gait and station: Station is normal.   Gait is normal. Tandem gait is normal. Romberg is negative.   Reflexes: Deep tendon reflexes are symmetric and normal bilaterally.       DIAGNOSTIC DATA (LABS, IMAGING, TESTING) - I reviewed patient records, labs, notes, testing and imaging myself where available.  Lab Results  Component Value Date   WBC 8.0 03/12/2014   HGB 15.1 (H) 03/12/2014   HCT 44.1 03/12/2014   MCV 90.4 03/12/2014   PLT 209 03/12/2014      Component Value Date/Time   NA 140 03/12/2014 1400   K 4.3 03/12/2014  1400   CL 103 03/12/2014 1400   CO2 25 03/12/2014 1400   GLUCOSE 106 (H) 03/12/2014 1400   BUN 10 03/12/2014 1400   CREATININE 0.90 03/12/2014 1400   CREATININE 0.75 09/01/2012 0940   CALCIUM 9.4 03/12/2014 1400   PROT 7.3 03/12/2014 1400   ALBUMIN 4.4 03/12/2014 1400   AST 14 03/12/2014 1400   ALT 13 03/12/2014 1400   ALKPHOS 72 03/12/2014 1400   BILITOT 0.4 03/12/2014 1400   GFRNONAA 82 (L) 03/12/2014 1400   GFRAA >90 03/12/2014 1400   Lab Results  Component Value Date   LDLDIRECT 73 12/17/2010   No results found for: HGBA1C Lab Results  Component Value Date   CWUGQBVQ94 503 05/12/2018   Lab Results  Component Value Date   TSH 1.310 05/12/2018       ASSESSMENT AND PLAN  Memory loss  Other fatigue  Visual disturbance  Cavernous angioma  Vitamin D deficiency  Berthe Oley A. Felecia Shelling, MD, PhD, FAAN Certified in Neurology, Clinical Neurophysiology, Sleep Medicine, Pain Medicine and Neuroimaging Director, [facility] [address] [place], [place] [address] [phone]

## 2019-07-19 ENCOUNTER — Telehealth: Payer: Self-pay | Admitting: Neurology

## 2019-07-19 DIAGNOSIS — R29898 Other symptoms and signs involving the musculoskeletal system: Secondary | ICD-10-CM

## 2019-07-19 HISTORY — DX: Other symptoms and signs involving the musculoskeletal system: R29.898

## 2019-07-19 NOTE — Telephone Encounter (Signed)
Patient called after hours call center d/t sudden onset of R foot weakness. I called her back and talked to patient: she reports having new onset R foot weakness, cannot lift it. Had been seeing Dr. Felecia Shelling for migraines and recently also for cognitive complaints. She has not had similar issues before. Therefore, due to acute weakness, and new onset neurological issue, she was advised to seek immediate medical attention at closest ER, which, per patient, would be in [place]. She was advised to call 911 or have someone take her, not try to drive herself. She stated, her husband would take her. She was advised, I would let Dr. Felecia Shelling know. She demonstrated understanding and agreement.  I reviewed her prior brain MRI result; she has a small venous anomaly in the right frontal lobe. This would not explain R foot weakness. She stated, she could not lift or bend foot, but still walk. She was advised, that there could be several different causes, the most sinister of which would be a stroke or TIA. Hence, immediate medical attention was warranted.

## 2019-07-20 DIAGNOSIS — R200 Anesthesia of skin: Secondary | ICD-10-CM

## 2019-07-20 HISTORY — DX: Anesthesia of skin: R20.0

## 2019-07-21 ENCOUNTER — Ambulatory Visit (INDEPENDENT_AMBULATORY_CARE_PROVIDER_SITE_OTHER): Payer: 59 | Admitting: Neurology

## 2019-07-21 ENCOUNTER — Other Ambulatory Visit: Payer: Self-pay

## 2019-07-21 ENCOUNTER — Encounter: Payer: Self-pay | Admitting: Neurology

## 2019-07-21 VITALS — BP 120/76 | HR 82 | Ht 66.0 in | Wt 138.0 lb

## 2019-07-21 DIAGNOSIS — R413 Other amnesia: Secondary | ICD-10-CM | POA: Diagnosis not present

## 2019-07-21 DIAGNOSIS — G5731 Lesion of lateral popliteal nerve, right lower limb: Secondary | ICD-10-CM

## 2019-07-21 DIAGNOSIS — M21371 Foot drop, right foot: Secondary | ICD-10-CM

## 2019-07-21 DIAGNOSIS — D1800 Hemangioma unspecified site: Secondary | ICD-10-CM

## 2019-07-21 HISTORY — DX: Foot drop, right foot: M21.371

## 2019-07-21 HISTORY — DX: Lesion of lateral popliteal nerve, right lower limb: G57.31

## 2019-07-21 MED ORDER — METHYLPREDNISOLONE 4 MG PO TBPK: ORAL_TABLET | ORAL | 0 refills | Status: DC

## 2019-07-21 NOTE — Progress Notes (Signed)
[facility]  PATIENT: Katherine Levine DOB: [birth year]  REFERRING DOCTOR OR PCP:   Addison Naegeli, MD SOURCE: Patient, notes from Dr. Benjamine Mola and Dr. Katherine Roan, imaging reports, MRI images on PACS.  _________________________________   HISTORICAL  CHIEF COMPLAINT:  Chief Complaint  Patient presents with  . Follow-up    RM 13, alone. She reports she is unable to move right foot up/having numbness from the knee down. Was seen at [facility] [place].. Was given flexeril to take prn for muscle spasms in right leg. Having some in left leg. Took one yesterday at 5pm (5mg )- made her not feel good (balance off, hard to concentrate). Pt requesting EMG/NCS.     HISTORY OF PRESENT ILLNESS:  Katherine Levine is a 38 yo woman with a right foot drop.    Update 07/21/19: Two days ago, she was shopping.  While standing she noted she couldn't move the right foot well.   She just thought it might have been asleep.   In retrospect, 2 days earlier she noted that he right sandal wouldn't stay on. Also she had a bad cramp in the right calf the day before.    Symptoms persisted the rest of the day and she also noted decreased O2 saturation (92 but hers was always 98-99).  She went to the ED.   She had an MRI which was normal.   I reviewed the images.  The MRI does not show any acute findings.  There is some susceptibility in the juxtacortical right frontal lobe consistent with a cavernous angioma seen previously.  Although there did seem to be a left anterolateral flair hyperintense focus in the medulla, this was not seen on the T2-weighted images and likely represents artifact.  She was admitted overnight and discharged yesterday.    Compared to a couple days ago, she is the same.   She has not had pain in the right leg.      Of note, she had lost 72 pounds this year.     I placed her on phentermine in May but most of the weight loss was before then.    She still notes reduced focus and  attention and felt the phentermine helped the memory loss.  She would like to continue on it.    Update 04/13/2019 (virtual) Migraines have done very well.  She reports only one migraine since the last visit.   Rizatriptan worked very well to eliminate the pain and other symptoms.  She is noting more issues with cognition.  She has mild memory issues and also gets letters mixed up --- like confusing B and D with typing and reading.  Focus is poor.    She is sleeping better.     She has lost 42 pounds by diet and exercise.   She had snoring but PSG a few years ago did not show OSA by report.     Vit D was only 14.1 in mid 2019.     Update 07/16/2018: She continues to report some difficulty with her vision as well as reduced short-term memory.   Since the last visit, she had an EEG which was normal.     She saw a retina specialist  And notes form 05/19/2018 were reviewed.   She reports he said her symptoms may be due to the cavernous angioma and there was some fluid behind the eye.    I don't see mention of that in the report.   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  That could cause a higher risk of retinal detachment.  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   I showed her the images.  It is small and there is no adjacent mass-effect so I do not think it is playing any role in her vision or other neurologic symptoms.  She has migraines one a week.   When one occurs, she takes Tylenol.   Ibuprofen helped more but she is concerned about taking NSAIDs.   She gets photophobia and phonophobia and is worse when she moves her head.    She does not have much nausea and no vomiting.     Her insomnia is doing better.    She is working 40 hours a week instead of 50-60 hours in her new job and it is less stressful.        From 05/12/2018: She has always been very active.   About 4-5 months ago,  she began to notice more difficulty with focus.   She had  trouble coming up with her address one time.   Another time, she had trouble reading a word.   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    Her optometrist felt she had some visual problems.    She notes some mild color problems.    Her VA was reportedly close to 20/20 in both eyes and she did not need glasses.     Often her vision seems worse in the morning and then clears up in the afternoon.   She also noted twitching of the right eye and she feel her eyelids are asymmetric.     She works 50 hours working 9 am to 8 pm most days and 1/2 days on Fridays and Sundays.    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      She has some insomnia with trouble falling asleep.    Some mornings, she is not refreshed (2-3 times a week).   She does not snore.   She had a PSG in the past and was told she did not have OSA.   Her weight is about the same since that test.     She gets some cramps in her legs, left > right.    Moving around makes the cramp better.  Most are in her calf and she needs to jump up to stretch it.    She gets 7-8 hours of sleep most nights and 6 on a bad night (if she has insomnia).     She has one x nocturia.      She has a h/o B12 deficiency (level was 120) and was on an oral supplementation for a while.    She denies depression, irritability or anxiety.       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   She had an infection as a complication.    She also notes a patch of hair missing overt he past few months.      I personally reviewed the MRI of the brain without contrast as well as the MRI of the brain with contrast.  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   This could represent a  cavernous angioma..  The rest of the brain was normal.  Due to the abnormal brain  MRI, she was referred to Dr. Katherine Roan who did not recommend any intervention.  REVIEW OF SYSTEMS: Constitutional: No fevers, chills, sweats, or change in appetite Eyes: No visual changes, double vision, eye pain Ear, nose and throat: No hearing loss, ear pain, nasal congestion, sore throat Cardiovascular: No chest pain, palpitations Respiratory: No shortness of breath at rest or with exertion.   No wheezes GastrointestinaI: No nausea, vomiting, diarrhea, abdominal pain, fecal incontinence Genitourinary: No dysuria, urinary retention or frequency.  No nocturia. Musculoskeletal: No neck pain, back pain Integumentary: No rash, pruritus, skin lesions Neurological: as above Psychiatric: No depression at this time.  No anxiety Endocrine: No palpitations, diaphoresis, change in appetite, change in weigh or increased thirst Hematologic/Lymphatic: No anemia, purpura, petechiae. Allergic/Immunologic: No itchy/runny eyes, nasal congestion, recent allergic reactions, rashes  ALLERGIES: Allergies  Allergen Reactions  . Amoxicillin Itching and Nausea And Vomiting  . Azithromycin Itching and Nausea And Vomiting  . Fexofenadine Itching and Nausea And Vomiting  . Sertraline Other (See Comments)    TREMORS  . Adhesive [Tape] Rash  . Ciprofloxacin Rash  . Meperidine Hcl Itching and Rash  . Penicillins Itching    HOME MEDICATIONS:  Current Outpatient Medications:  .  cetirizine (ZYRTEC) 10 MG chewable tablet, Chew 10 mg by mouth as needed. , Disp: , Rfl:  .  cyclobenzaprine (FLEXERIL) 5 MG tablet, Take 5 mg by mouth 3 (three) times daily as needed for muscle spasms., Disp: , Rfl:  .  fluticasone (FLONASE) 50 MCG/ACT nasal spray, Place into both nostrils as needed. , Disp: , Rfl:  .  phentermine 37.5 MG capsule, Take 1 capsule (37.5 mg total) by mouth every morning., Disp: 30 capsule, Rfl: 5 .  rizatriptan (MAXALT-MLT) 10 MG disintegrating tablet, Take 1 tablet (10 mg total) by mouth as  needed for migraine. May repeat in 2 hours if needed, Disp: 9 tablet, Rfl: 11 .  methylPREDNISolone (MEDROL DOSEPAK) 4 MG TBPK tablet, Taper form 6 po the first day to 1 po over 6 days, Disp: 21 tablet, Rfl: 0  PAST MEDICAL HISTORY: Past Medical History:  Diagnosis Date  . Dental crowns present    #8,9,25 per pt.  . History of kidney stones   . Left ACL tear 01/2017  . Vision abnormalities     PAST SURGICAL HISTORY: Past Surgical History:  Procedure Laterality Date  . CESAREAN SECTION  2012; 2000   x 2  . CHOLECYSTECTOMY     age 54  . DILITATION & CURRETTAGE/HYSTROSCOPY WITH NOVASURE ABLATION  10/19/2012   Procedure: DILATATION & CURETTAGE/HYSTEROSCOPY WITH NOVASURE ABLATION;  Surgeon: Princess Bruins, MD;  Location: [facility];  Service: Gynecology;  Laterality: N/A;  . KNEE ARTHROSCOPY WITH ANTERIOR CRUCIATE LIGAMENT (ACL) REPAIR Left 02/19/2017   Procedure: LEFT KNEE ARTHROSCOPY WITH ANTERIOR CRUCIATE LIGAMENT (ACL) REPAIR ALLOGRAFT;  Surgeon: Renette Butters, MD;  Location: [facility];  Service: Orthopedics;  Laterality: Left;  . LIVER RESECTION  06/2011  . TUBAL LIGATION      FAMILY HISTORY: Family History  Problem Relation Age of Onset  . Cancer Mother        thyroid cancer  . Diabetes Mother   . Heart disease Mother   . Hypertension Mother   . Stroke Mother   . Melanoma Father     SOCIAL HISTORY:  Social History   Socioeconomic History  . Marital status: Married  Spouse name: Not on file  . Number of children: Not on file  . Years of education: Not on file  . Highest education level: Not on file  Occupational History  . Not on file  Social Needs  . Financial resource strain: Not on file  . Food insecurity    Worry: Not on file    Inability: Not on file  . Transportation needs    Medical: Not on file    Non-medical: Not on file  Tobacco Use  . Smoking status: Never Smoker  . Smokeless tobacco: Never Used  Substance and Sexual  Activity  . Alcohol use: Yes    Alcohol/week: 0.0 standard drinks    Comment: occasionally  . Drug use: No  . Sexual activity: Yes    Birth control/protection: Surgical  Lifestyle  . Physical activity    Days per week: Not on file    Minutes per session: Not on file  . Stress: Not on file  Relationships  . Social Herbalist on phone: Not on file    Gets together: Not on file    Attends religious service: Not on file    Active member of club or organization: Not on file    Attends meetings of clubs or organizations: Not on file    Relationship status: Not on file  . Intimate partner violence    Fear of current or ex partner: Not on file    Emotionally abused: Not on file    Physically abused: Not on file    Forced sexual activity: Not on file  Other Topics Concern  . Not on file  Social History Narrative  . Not on file     PHYSICAL EXAM  Vitals:   07/21/19 1333  BP: 120/76  Pulse: 82  Weight: 138 lb (62.6 kg)  Height: 5\' 6"  (1.676 m)    Body mass index is 22.27 kg/m.   General: The patient is well-developed and well-nourished and in no acute distress    Neurologic Exam  Mental status: The patient is alert and oriented x 3 at the time of the examination. The patient has apparent normal recent and remote memory, with an apparently normal attention span and concentration ability.   Speech is normal.  Cranial nerves: Extraocular movements are full.  Facial strength and sensation is normal.  Trapezius strength is normal.. No dysarthria is noted.  The tongue is midline, and the patient has symmetric elevation of the soft palate. No obvious hearing deficits are noted.  Motor:  Muscle bulk is normal.   Tone is normal. Strength is 2/5 in the right toe and  and ankle extensors.  Strength is  5 / 5 in arms and left leg.   Sensory: She has intact sensation to touch and vibration in the legs/feet..  Coordination: Cerebellar testing reveals good  finger-nose-finger and heel-to-shin bilaterally.  Gait and station: Station is normal.   Gait shows a right foot dropl. Tandem gait is normal. Romberg is negative.   Reflexes: Deep tendon reflexes are symmetric and normal bilaterally.       DIAGNOSTIC DATA (LABS, IMAGING, TESTING) - I reviewed patient records, labs, notes, testing and imaging myself where available.  Lab Results  Component Value Date   WBC 8.0 03/12/2014   HGB 15.1 (H) 03/12/2014   HCT 44.1 03/12/2014   MCV 90.4 03/12/2014   PLT 209 03/12/2014      Component Value Date/Time   NA 140 03/12/2014 1400  K 4.3 03/12/2014 1400   CL 103 03/12/2014 1400   CO2 25 03/12/2014 1400   GLUCOSE 106 (H) 03/12/2014 1400   BUN 10 03/12/2014 1400   CREATININE 0.90 03/12/2014 1400   CREATININE 0.75 09/01/2012 0940   CALCIUM 9.4 03/12/2014 1400   PROT 7.3 03/12/2014 1400   ALBUMIN 4.4 03/12/2014 1400   AST 14 03/12/2014 1400   ALT 13 03/12/2014 1400   ALKPHOS 72 03/12/2014 1400   BILITOT 0.4 03/12/2014 1400   GFRNONAA 82 (L) 03/12/2014 1400   GFRAA >90 03/12/2014 1400   Lab Results  Component Value Date   LDLDIRECT 73 12/17/2010   No results found for: HGBA1C Lab Results  Component Value Date   [id number] 810 05/12/2018   Lab Results  Component Value Date   TSH 1.310 05/12/2018       ASSESSMENT AND PLAN    1. Peroneal neuropathy, right   2. Right foot drop   3. Memory loss   4. Cavernous angioma      1.  She has an incomplete right peroneal nerve neuropathy.   Since strength is 2- to 2 / 5 there is potential she can start to notice some recovery within a couple weeks.    We will set up an NCV/EMG in a couple weeks to help [place] rule out alternative etiology and help with prognosis determination. 2.  She is being set up for PT. 3.   Stay active and exercise. 4.   I will see her at the NCV/EMG and she should call sooner if any significant worsening or new symptoms  Raney Antwine A. Felecia Shelling, MD, PhD, FAAN  Certified in Neurology, Clinical Neurophysiology, Sleep Medicine, Pain Medicine and Neuroimaging Director, [place] at [facility] [address] [place], [place] [address] [phone]

## 2019-08-02 ENCOUNTER — Other Ambulatory Visit: Payer: Self-pay | Admitting: Neurology

## 2019-08-03 ENCOUNTER — Other Ambulatory Visit: Payer: Self-pay | Admitting: Neurology

## 2019-08-03 ENCOUNTER — Telehealth: Payer: Self-pay | Admitting: Neurology

## 2019-08-03 DIAGNOSIS — G5731 Lesion of lateral popliteal nerve, right lower limb: Secondary | ICD-10-CM

## 2019-08-03 NOTE — Telephone Encounter (Signed)
I called this patient regarding moving her ncv/emg up to 9/15 at 7:15 am. Dr. Felecia Shelling is willing to use his 8 am that day for his part of the test.

## 2019-08-10 ENCOUNTER — Other Ambulatory Visit: Payer: Self-pay

## 2019-08-10 ENCOUNTER — Ambulatory Visit: Payer: 59 | Attending: Neurology | Admitting: Physical Therapy

## 2019-08-10 DIAGNOSIS — R2689 Other abnormalities of gait and mobility: Secondary | ICD-10-CM | POA: Diagnosis present

## 2019-08-10 DIAGNOSIS — M79661 Pain in right lower leg: Secondary | ICD-10-CM | POA: Insufficient documentation

## 2019-08-10 DIAGNOSIS — R29818 Other symptoms and signs involving the nervous system: Secondary | ICD-10-CM | POA: Diagnosis present

## 2019-08-10 DIAGNOSIS — M6281 Muscle weakness (generalized): Secondary | ICD-10-CM | POA: Diagnosis not present

## 2019-08-10 DIAGNOSIS — R262 Difficulty in walking, not elsewhere classified: Secondary | ICD-10-CM | POA: Diagnosis present

## 2019-08-10 NOTE — Therapy (Addendum)
[facility] [address]  [place], [place], [address] Phone: [phone]   Fax:  [phone]  Physical Therapy Evaluation  Patient Details  Name: Katherine Levine MRN: [id number] Date of Birth: 01-18-1981 Referring Provider (PT): Arlice Colt, MD   Encounter Date: 08/10/2019  PT End of Session - 08/10/19 0808    Visit Number  1    Number of Visits  20    Date for PT Re-Evaluation  10/19/19    Authorization Type  [employer]    Authorization - Number of Visits  20    PT Start Time  0808    PT Stop Time  0916    PT Time Calculation (min)  68 min    Activity Tolerance  Patient tolerated treatment well    Behavior During Therapy  [facility] for tasks assessed/performed       Past Medical History:  Diagnosis Date  . Dental crowns present    #8,9,25 per pt.  . History of kidney stones   . Left ACL tear 01/2017  . Vision abnormalities     Past Surgical History:  Procedure Laterality Date  . CESAREAN SECTION  2012; 2000   x 2  . CHOLECYSTECTOMY     age 56  . DILITATION & CURRETTAGE/HYSTROSCOPY WITH NOVASURE ABLATION  10/19/2012   Procedure: DILATATION & CURETTAGE/HYSTEROSCOPY WITH NOVASURE ABLATION;  Surgeon: Princess Bruins, MD;  Location: [facility];  Service: Gynecology;  Laterality: N/A;  . KNEE ARTHROSCOPY WITH ANTERIOR CRUCIATE LIGAMENT (ACL) REPAIR Left 02/19/2017   Procedure: LEFT KNEE ARTHROSCOPY WITH ANTERIOR CRUCIATE LIGAMENT (ACL) REPAIR ALLOGRAFT;  Surgeon: Renette Butters, MD;  Location: [facility];  Service: Orthopedics;  Laterality: Left;  . LIVER RESECTION  06/2011  . TUBAL LIGATION      There were no vitals filed for this visit.   Subjective Assessment - 08/10/19 0812    Subjective  Pt reports sudden onset of inability to move her R foot while standing in line at [facility] ~4 weeks ago, but in retrospect notes increased incidence of tripping and difficulty keeping a slide-style shoe on R  foot for several months prior. Also noted loss of sensation in R lower leg. Reports cramping and curling of toes as well. She denies bowel and bladder changes. She was told her R foot drop is likely due to trauma to/irritation of peroneal nerve from loss of fat padding (she has lost 70# since January). She has been wearing carbon fiber AFO on the R for the foot drop but still feels like she will catch her foot when walking, although notes she is not lifting her hip as much when walking as prior to receiving the AFO.    Pertinent History  L ACL repair 2018    Limitations  Sitting;Standing;Walking;House hold activities    How long can you sit comfortably?  30-60 minutes (cramping worse with sitting)    How long can you stand comfortably?  5 minutes (numbness worsening with prolonged standing)    How long can you walk comfortably?  5-10 minutes (numbness progresses)    Diagnostic tests  NCV scheduled for 08/16/19    Patient Stated Goals  "I want my foot back to normal w/o the brace (AFO)"    Currently in Pain?  Yes    Pain Score  3    up to 10/10 at worst when cramping   Pain Location  Foot    Pain Orientation  Right  Pain Descriptors / Indicators  Constant;Cramping;Dull;Aching    Pain Type  Acute pain    Pain Radiating Towards  great toe over dorsum of foot; ball of foot; tingling/twinges in lower legs    Pain Onset  1 to 4 weeks ago    Aggravating Factors   gradually progressing over past few weeks; worst at night    Pain Relieving Factors  heating pad    Effect of Pain on Daily Activities  limited standing & walking tolerance         [facility] PT Assessment - 08/10/19 0808      Assessment   Medical Diagnosis  R peroneal neuropathy    Referring Provider (PT)  Arlice Colt, MD    Onset Date/Surgical Date  --   foot drop ~4 weeks but symptoms for weeks prior   Next MD Visit  08/17/19    Prior Therapy  PT after L ACL repair in 2018      Precautions   Precautions  Fall      Balance  Screen   Has the patient fallen in the past 6 months  No    Has the patient had a decrease in activity level because of a fear of falling?   Yes    Is the patient reluctant to leave their home because of a fear of falling?   No      Home Social worker  Private residence    Living Arrangements  Spouse/significant other;Children    Type of Bear to enter    Entrance Stairs-Number of Steps  4-5    Entrance Stairs-Rails  Right;Left;Cannot reach both    [place]  Two level;Laundry or work area in basement;Able to live on main level with bedroom/bathroom    Alternate Level Stairs-Number of Steps  15    Additional Comments  L carbon fiber AFO      Prior Function   Level of Independence  Independent    Vocation  Full time employment    Loss adjuster, chartered in Biomedical engineer office    Leisure  muscian - 5-6 shows/month; 30 minute workout nightly + 2-3 days week walking in home for 30 minutes (only 15-20 minutes currently)      Cognition   Overall Cognitive Status  Within Functional Limits for tasks assessed      Sensation   Light Touch  Impaired by gross assessment   B distal LE (R>L)     ROM / Strength   AROM / PROM / Strength  AROM;PROM;Strength      AROM   AROM Assessment Site  Lumbar;Ankle    Right/Left Ankle  Right;Left    Right Ankle Dorsiflexion  10    Right Ankle Plantar Flexion  55    Right Ankle Inversion  31    Right Ankle Eversion  24    Left Ankle Dorsiflexion  -31    Left Ankle Plantar Flexion  54    Left Ankle Inversion  29    Left Ankle Eversion  1      PROM   PROM Assessment Site  Ankle    Right/Left Ankle  Right    Right Ankle Dorsiflexion  8    Right Ankle Eversion  25      Strength   Strength Assessment Site  Hip;Knee;Ankle    Right/Left Hip  Right;Left    Right Hip Flexion  4-/5  Right Hip Extension  3+/5    Right Hip External Rotation   3+/5    Right Hip Internal Rotation  4-/5    Right  Hip ABduction  4-/5    Right Hip ADduction  3+/5    Left Hip Flexion  4+/5    Left Hip Extension  4+/5    Left Hip External Rotation  3+/5    Left Hip Internal Rotation  4+/5    Left Hip ABduction  4/5    Left Hip ADduction  4+/5    Right/Left Knee  Right;Left    Right Knee Flexion  4-/5    Right Knee Extension  4-/5    Left Knee Flexion  4+/5    Left Knee Extension  4+/5    Right/Left Ankle  Right;Left    Right Ankle Dorsiflexion  1/5    Right Ankle Plantar Flexion  3+/5   limited lift in single leg heel raise   Right Ankle Inversion  4/5    Right Ankle Eversion  1/5    Left Ankle Dorsiflexion  4/5    Left Ankle Plantar Flexion  4/5    Left Ankle Inversion  4/5    Left Ankle Eversion  4+/5      Flexibility   Soft Tissue Assessment /Muscle Length  yes    Hamstrings  WFL    Quadriceps  mild tight on R    ITB  WFL    Piriformis  WFL      Palpation   Palpation comment  ttp in R ant tibialis & gastroc      Ambulation/Gait   Assistive device  --   R carbon fiber AFO   Gait Pattern  Step-through pattern;Decreased dorsiflexion - right;Poor foot clearance - right;Trendelenburg                Objective measurements completed on examination: See above findings.      [facility] Adult PT Treatment/Exercise - 08/10/19 0808      Self-Care   Self-Care  Posture    Posture  Instructions provided to avoid crossing legs while sitting to reduce risk on pressure on peroneal nerve at fibuar head.      Ankle Exercises: Stretches   Plantar Fascia Stretch  30 seconds;1 rep   Right   Plantar Fascia Stretch Limitations  long sitting with towel     Soleus Stretch  30 seconds;2 reps   Right   Soleus Stretch Limitations  long sitting with towel & standing    Gastroc Stretch  30 seconds;2 reps   Right   Gastroc Stretch Limitations  long sitting with towel & standing             PT Education - 08/10/19 0916    Education Details  PT eval findings, anticipated POC, initial  HEP    Person(s) Educated  Patient    Methods  Explanation;Demonstration;Handout    Comprehension  Verbalized understanding;Returned demonstration;Need further instruction       PT Short Term Goals - 08/10/19 1458      PT SHORT TERM GOAL #1   Title  Independent with initial HEP    Status  New    Target Date  08/31/19      PT SHORT TERM GOAL #2   Title  Patient will improve R LE  strength by 1/2 grade on average for improved motor control and stability    Status  New    Target Date  09/14/19  PT Long Term Goals - 08/10/19 0916      PT LONG TERM GOAL #1   Title  Independent with ongoing/advanced HEP    Status  New    Target Date  10/19/19      PT LONG TERM GOAL #2   Title  Decrease R LE pain by >/= 50%    Status  New    Target Date  10/19/19      PT LONG TERM GOAL #3   Title  R ankle AROM WFL to allow for normal gait pattern w/o need for AFO    Status  New    Target Date  10/19/19      PT LONG TERM GOAL #4   Title  Increase overall R LE strength to >/= 4/5 to 4+/5    Status  New    Target Date  10/19/19      PT LONG TERM GOAL #5   Title  Walk without R AFO, all distances and surfaces with minimal deviation    Status  New    Target Date  10/19/19             Plan - 08/10/19 [id number]    Clinical Impression Statement  Katherine Levine is a 38 y/o female who presents to OP PT for R foot drop due to presumed peroneal neuropathy. She reports she first noticed lack of control of her R foot ~4 weeks ago but in retrospect recognizes signs of weakness and altered gait pattern with increased incidence of tripping going back several months. She reports peroneal nerve injury presumed to be due loss of fat padding at the fibular head from her 70# weight loss since January 2020, however strength testing on eval revealing significant weakness throughout R LE musculature most pronounced in R hip as well as R ankle DF and eversion. Gait pattern revealing mild Trendelenburg hip drop in  addition to limited R foot clearance even with AFO and foot slap in absence of AFO. Patient also noting sensory changes in B distal lower extremities, reporting numbness and tingling, R>L, with increased muscle cramping in R calf and foot. Presence of significant proximal R LE weakness in addition to distal weakness/foot drop as well as B sensory changes indicate potential for more central problem rather than isolated peroneal neuropathy, although patient denies LBP or bowel and bladder changes. Patient reporting NCV testing scheduled for 08/16/19 which will hopefully better indicate source of her problems. Katherine Levine will benefit from skilled PT for LE strengthening as well as gait and balance training to help with full return of R LE function.    Personal Factors and Comorbidities  Past/Current Experience;Time since onset of injury/illness/exacerbation    Examination-Activity Limitations  Locomotion Level;Sit;Stand;Transfers    Examination-Participation Restrictions  Community Activity;Driving;Other   work   [employer]chant [employer] officer  Evolving/Moderate complexity    Clinical Decision Making  Moderate    Rehab Potential  Good    PT Frequency  2x / week   potentially tapering to 1x/wk   PT Duration  Other (comment)   10 weeks   PT Treatment/Interventions  ADLs/Self Care Home Management;Cryotherapy;Electrical Stimulation;Iontophoresis 4mg /ml Dexamethasone;Moist Heat;Ultrasound;Gait training;Stair training;Functional mobility training;Therapeutic activities;Therapeutic exercise;Balance training;Neuromuscular re-education;Patient/family education;Orthotic Fit/Training;Manual techniques;Passive range of motion;Dry needling;Taping;Spinal Manipulations;Joint Manipulations    PT Next Visit Plan  Review initial HEP stretches; manual therapy to address abnormal muscle tension/cramping; R ankle ROM; R LE strengthening; modalities PRN    PT Home Exercise Plan  08/10/19 - gastroc/soleus & foot intrinsic  stretches,  R ankle APs & circles    Consulted and Agree with Plan of Care  Patient       Patient will benefit from skilled therapeutic intervention in order to improve the following deficits and impairments:  Abnormal gait, Decreased activity tolerance, Decreased balance, Decreased coordination, Decreased endurance, Decreased mobility, Decreased range of motion, Decreased safety awareness, Decreased strength, Difficulty walking, Increased muscle spasms, Impaired perceived functional ability, Impaired flexibility, Impaired sensation, Impaired tone, Pain  Visit Diagnosis: Muscle weakness (generalized) - Plan: PT plan of care cert/re-cert  Other symptoms and signs involving the nervous system - Plan: PT plan of care cert/re-cert  Pain in right lower leg - Plan: PT plan of care cert/re-cert  Other abnormalities of gait and mobility - Plan: PT plan of care cert/re-cert  Difficulty in walking, not elsewhere classified - Plan: PT plan of care cert/re-cert     Problem List Patient Active Problem List   Diagnosis Date Noted  . 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 Right foot drop 07/21/2019  . Vitamin D deficiency 04/13/2019  . 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 Memory loss 05/12/2018  . B12 deficiency 05/12/2018  . 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 Visual disturbance 05/12/2018  . Left ACL tear 02/17/2017  . Palpitations 03/06/2016  . Burn 06/01/2013  . Other fatigue 09/01/2012  . DUB (dysfunctional uterine bleeding) 09/01/2012  . Allergic reaction to drug 07/13/2012  . 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 RUQ abdominal pain 03/12/2012  . Nodule on liver   . NONSPECIFIC ABN FINDING RAD & OTH EXAM GI TRACT 12/26/2010  . OBESITY, UNSPECIFIED 12/17/2010  . ANXIETY STATE, UNSPECIFIED 12/17/2010    Percival Spanish, PT, MPT 08/10/2019, 3:21 PM  [facility] [address]  [place], [place], [address] Phone: [phone]    Fax:  [phone]  Name: Katherine Levine MRN: [id number] Date of Birth: 09/28/1981

## 2019-08-12 ENCOUNTER — Emergency Department (HOSPITAL_BASED_OUTPATIENT_CLINIC_OR_DEPARTMENT_OTHER)
Admission: EM | Admit: 2019-08-12 | Discharge: 2019-08-13 | Disposition: A | Discharge status: Home / Self Care | Payer: 59 | Attending: Emergency Medicine | Admitting: Emergency Medicine

## 2019-08-12 ENCOUNTER — Emergency Department (HOSPITAL_BASED_OUTPATIENT_CLINIC_OR_DEPARTMENT_OTHER): Payer: 59

## 2019-08-12 ENCOUNTER — Encounter (HOSPITAL_BASED_OUTPATIENT_CLINIC_OR_DEPARTMENT_OTHER): Payer: Self-pay

## 2019-08-12 ENCOUNTER — Other Ambulatory Visit: Payer: Self-pay

## 2019-08-12 DIAGNOSIS — Z880 Allergy status to penicillin: Secondary | ICD-10-CM | POA: Insufficient documentation

## 2019-08-12 DIAGNOSIS — R5383 Other fatigue: Secondary | ICD-10-CM | POA: Insufficient documentation

## 2019-08-12 DIAGNOSIS — Z20828 Contact with and (suspected) exposure to other viral communicable diseases: Secondary | ICD-10-CM | POA: Diagnosis not present

## 2019-08-12 DIAGNOSIS — Z91048 Other nonmedicinal substance allergy status: Secondary | ICD-10-CM | POA: Insufficient documentation

## 2019-08-12 DIAGNOSIS — Z888 Allergy status to other drugs, medicaments and biological substances status: Secondary | ICD-10-CM | POA: Diagnosis not present

## 2019-08-12 DIAGNOSIS — R0602 Shortness of breath: Secondary | ICD-10-CM | POA: Diagnosis not present

## 2019-08-12 DIAGNOSIS — R42 Dizziness and giddiness: Secondary | ICD-10-CM | POA: Diagnosis not present

## 2019-08-12 DIAGNOSIS — Z881 Allergy status to other antibiotic agents status: Secondary | ICD-10-CM | POA: Insufficient documentation

## 2019-08-12 LAB — CBC WITH DIFFERENTIAL/PLATELET
Abs Immature Granulocytes: 0.02 10*3/uL (ref 0.00–0.07)
Basophils Absolute: 0 10*3/uL (ref 0.0–0.1)
Basophils Relative: 0 %
Eosinophils Absolute: 0.1 10*3/uL (ref 0.0–0.5)
Eosinophils Relative: 1 %
HCT: 41.6 % (ref 36.0–46.0)
Hemoglobin: 13.4 g/dL (ref 12.0–15.0)
Immature Granulocytes: 0 %
Lymphocytes Relative: 34 %
Lymphs Abs: 2.5 10*3/uL (ref 0.7–4.0)
MCH: 30.9 pg (ref 26.0–34.0)
MCHC: 32.2 g/dL (ref 30.0–36.0)
MCV: 96.1 fL (ref 80.0–100.0)
Monocytes Absolute: 0.4 10*3/uL (ref 0.1–1.0)
Monocytes Relative: 6 %
Neutro Abs: 4.2 10*3/uL (ref 1.7–7.7)
Neutrophils Relative %: 59 %
Platelets: 210 10*3/uL (ref 150–400)
RBC: 4.33 MIL/uL (ref 3.87–5.11)
RDW: 13.6 % (ref 11.5–15.5)
WBC: 7.2 10*3/uL (ref 4.0–10.5)
nRBC: 0 % (ref 0.0–0.2)

## 2019-08-12 LAB — COMPREHENSIVE METABOLIC PANEL
ALT: 9 U/L (ref 0–44)
AST: 12 U/L — ABNORMAL LOW (ref 15–41)
Albumin: 4.1 g/dL (ref 3.5–5.0)
Alkaline Phosphatase: 55 U/L (ref 38–126)
Anion gap: 11 (ref 5–15)
BUN: 15 mg/dL (ref 6–20)
CO2: 23 mmol/L (ref 22–32)
Calcium: 8.9 mg/dL (ref 8.9–10.3)
Chloride: 104 mmol/L (ref 98–111)
Creatinine, Ser: 0.67 mg/dL (ref 0.44–1.00)
GFR calc Af Amer: >60 mL/min (ref >60)
GFR calc non Af Amer: >60 mL/min (ref >60)
Glucose, Bld: 97 mg/dL (ref 70–99)
Potassium: 3.7 mmol/L (ref 3.5–5.1)
Sodium: 138 mmol/L (ref 135–145)
Total Bilirubin: 0.6 mg/dL (ref 0.3–1.2)
Total Protein: 6.4 g/dL — ABNORMAL LOW (ref 6.5–8.1)

## 2019-08-12 LAB — URINALYSIS, ROUTINE W REFLEX MICROSCOPIC
Bilirubin Urine: NEGATIVE
Glucose, UA: NEGATIVE mg/dL
Ketones, ur: 15 mg/dL — AB
Leukocytes,Ua: NEGATIVE
Nitrite: NEGATIVE
Protein, ur: NEGATIVE mg/dL
Specific Gravity, Urine: 1.01 (ref 1.005–1.030)
pH: 7 (ref 5.0–8.0)

## 2019-08-12 LAB — TROPONIN I (HIGH SENSITIVITY): Troponin I (High Sensitivity): <2 ng/L (ref <18)

## 2019-08-12 LAB — URINALYSIS, MICROSCOPIC (REFLEX)

## 2019-08-12 LAB — PREGNANCY, URINE: Preg Test, Ur: NEGATIVE

## 2019-08-12 LAB — D-DIMER, QUANTITATIVE: D-Dimer, Quant: <0.27 ug/mL-FEU (ref 0.00–0.50)

## 2019-08-12 NOTE — ED Notes (Signed)
ED Provider at bedside. 

## 2019-08-12 NOTE — ED Notes (Signed)
Patient transported to Ultrasound 
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
## 2019-08-12 NOTE — ED Triage Notes (Signed)
Pt c/o fatigue, low O2 sats with activity-right calf pain-sx started 4 weeks ago-states she has been seen for c/o-last was with PCP this week and was advised to come to ED-pt NAD-steady gait-wearing right LE brace

## 2019-08-13 LAB — SARS CORONAVIRUS 2 (TAT 6-24 HRS): SARS Coronavirus 2: NEGATIVE

## 2019-08-16 ENCOUNTER — Telehealth: Payer: Self-pay | Admitting: *Deleted

## 2019-08-16 ENCOUNTER — Encounter: Payer: 59 | Admitting: Neurology

## 2019-08-16 NOTE — Telephone Encounter (Signed)
The patient's NCV/EMG appt was canceled this morning by our office.  I left her a voicemail offering a reschedule for 08/18/2019.  She would need to arrive to our office at 1:30pm for 2pm NCV with Elmyra Ricks.  The EMG portion would start at 3pm so there will be a little down time in between.  I ask her to call us back, if this time works for her so she can be added to the schedule.

## 2019-08-16 NOTE — Telephone Encounter (Signed)
FYI, Pt called and and agreed to her NCV/EMG being moved to 09-17 with a check in of 1:30 for the 2pm appointment and the EMG being from 3:00-3:30, no call back requested

## 2019-08-17 ENCOUNTER — Other Ambulatory Visit: Payer: Self-pay

## 2019-08-17 ENCOUNTER — Ambulatory Visit: Payer: 59

## 2019-08-17 DIAGNOSIS — M6281 Muscle weakness (generalized): Secondary | ICD-10-CM

## 2019-08-17 DIAGNOSIS — R29818 Other symptoms and signs involving the nervous system: Secondary | ICD-10-CM

## 2019-08-17 DIAGNOSIS — R2689 Other abnormalities of gait and mobility: Secondary | ICD-10-CM

## 2019-08-17 DIAGNOSIS — R262 Difficulty in walking, not elsewhere classified: Secondary | ICD-10-CM

## 2019-08-17 DIAGNOSIS — M79661 Pain in right lower leg: Secondary | ICD-10-CM
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
## 2019-08-18 ENCOUNTER — Ambulatory Visit (INDEPENDENT_AMBULATORY_CARE_PROVIDER_SITE_OTHER): Payer: 59 | Admitting: Neurology

## 2019-08-18 ENCOUNTER — Encounter (INDEPENDENT_AMBULATORY_CARE_PROVIDER_SITE_OTHER): Payer: 59 | Admitting: Neurology

## 2019-08-18 ENCOUNTER — Other Ambulatory Visit: Payer: Self-pay

## 2019-08-18 DIAGNOSIS — G5731 Lesion of lateral popliteal nerve, right lower limb: Secondary | ICD-10-CM | POA: Diagnosis not present

## 2019-08-18 DIAGNOSIS — Z0289 Encounter for other administrative examinations: Secondary | ICD-10-CM

## 2019-08-18 DIAGNOSIS — M5416 Radiculopathy, lumbar region: Secondary | ICD-10-CM

## 2019-08-18 NOTE — Progress Notes (Addendum)
Full Name: Katherine Levine Gender: Female MRN #: [id number] Date of Birth: [birth year]    Visit Date: 08/18/2019 14:15 Age: 38 Years 51 Months Old Examining Physician: Arlice Colt, MD  Referring Physician: Arlice Colt, MD    History:  Katherine Levine is a 38 year old woman with right leg numbness and pain.  The numbness is in the anterolateral lower leg and the dorsum of the foot.  Weakness involves the toe and ankle extensors.  She also has milder weakness more proximally in the leg.  Symptoms started about 5 weeks ago.  Compared to last week, she is able to move her toes and feet a little better this week.  Nerve conduction studies:  The right peroneal motor response had a normal distal latency and amplitude but there was slowing between the popliteal fossa and the fibular head.  The left peroneal and both tibial responses were normal.  Sural and superficial peroneal sensory responses were normal.  Needle EMG:  EMG of selected muscles of the right leg was performed.  There was moderate mixed acute and chronic denervation in the tibialis anterior and peroneus longus muscle.  Spontaneous activity was noted in these muscles.  Other muscles tested in the leg were normal.  Impression: This NCV/EMG study shows the following: 1.   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 2.   No superimposed radiculopathy noted.   A. Felecia Shelling, MD, PhD, FAAN Certified in Neurology, Clinical Neurophysiology, Sleep Medicine, Pain Medicine and Neuroimaging Director, [facility] [address], [place], [place] [address] [phone]  Addendum: Although this is likely due to peroneal neuropathy, she has milder proximal weakness.  We will check an MRI of the lumbar spine to determine if there is an L5 radiculopathy that could mimic a peroneal neuropathy --RAS        MNC    Nerve / Sites Muscle Latency Ref. Amplitude Ref. Rel Amp Segments Distance Velocity Ref. Area    ms ms mV mV %  cm m/s m/s mVms  R Peroneal - EDB     Ankle EDB 4.8 ?6.5 5.0 ?2.0 100 Ankle - EDB 9   27.9     Fib head EDB 10.8  4.4  87.3 Fib head - Ankle 29 48 ?44 27.2     Pop fossa EDB 13.6  3.3  75.3 Pop fossa - Fib head 10 36 ?44 19.3         Pop fossa - Ankle      L Peroneal - EDB     Ankle EDB 4.5 ?6.5 5.1 ?2.0 100 Ankle - EDB 9   18.0     Fib head EDB 10.3  4.5  88.9 Fib head - Ankle 29 50 ?44 17.3     Pop fossa EDB 12.6  4.0  89.3 Pop fossa - Fib head 10 44 ?44 14.9         Pop fossa - Ankle      R Tibial - AH     Ankle AH 4.0 ?5.8 12.1 ?4.0 100 Ankle - AH 9   30.9     Pop fossa AH 12.6  10.0  82.7 Pop fossa - Ankle 37 43 ?41 33.0  L Tibial - AH     Ankle AH 4.2 ?5.8 15.8 ?4.0 100 Ankle - AH 9   40.9     Pop fossa AH 12.1  12.7  80.1 Pop fossa - Ankle 36 45 ?41 38.0             SNC    Nerve / Sites Rec. Site Peak Lat Ref.  Amp Ref. Segments Distance    ms ms V V  cm  R Sural - Ankle (Calf)     Calf Ankle 3.7 ?4.4 13 ?6 Calf - Ankle 14  L Sural - Ankle (Calf)     Calf Ankle 3.5 ?4.4 18 ?6 Calf - Ankle 14  R Superficial peroneal - Ankle     Lat leg Ankle 4.1 ?4.4 10 ?6 Lat leg - Ankle 14  L Superficial peroneal - Ankle     Lat leg Ankle 3.7 ?4.4 10 ?6 Lat leg - Ankle 14              F  Wave    Nerve F Lat Ref.   ms ms  R Tibial - AH 54.7 ?56.0  L Tibial - AH 50.2 ?56.0         EMG full       EMG Summary Table    Spontaneous MUAP Recruitment  Muscle IA Fib PSW Fasc Other Amp Dur. Poly Pattern  R. Vastus medialis Normal None None None _______ Increased Increased 2+ Discrete  R. Tibialis anterior Normal 2+ 1+ None _______ Increased Increased 2+ Discrete  R. Peroneus longus Normal None None None _______ Normal Normal Normal Normal  R. Gastrocnemius (Medial head) Normal None None None _______ Normal Normal Normal Normal  R. Gluteus medius Normal None None None  _______ Normal Normal Normal Normal  R. Iliopsoas Normal None None None _______ Normal Normal Normal Normal  R. Abductor hallucis Normal None None None _______ Normal Normal Normal Normal

## 2019-08-23 ENCOUNTER — Telehealth: Payer: Self-pay | Admitting: Neurology

## 2019-08-23 NOTE — Telephone Encounter (Signed)
Patient returned my call she is scheduled at [facility] for 08/24/19 no to the covid questions.

## 2019-08-23 NOTE — Telephone Encounter (Signed)
LVM for pt to call back about scheduling [facility] Auth: [id number] (exp. 08/23/19 to 10/07/19)

## 2019-08-24 ENCOUNTER — Other Ambulatory Visit: Payer: Self-pay

## 2019-08-24 ENCOUNTER — Ambulatory Visit: Payer: 59

## 2019-08-24 DIAGNOSIS — M5416 Radiculopathy, lumbar region: Secondary | ICD-10-CM

## 2019-08-24 DIAGNOSIS — M6281 Muscle weakness (generalized): Secondary | ICD-10-CM

## 2019-08-24 DIAGNOSIS — R29818 Other symptoms and signs involving the nervous system: Secondary | ICD-10-CM

## 2019-08-24 DIAGNOSIS — M79661 Pain in right lower leg: Secondary | ICD-10-CM

## 2019-08-24 DIAGNOSIS — R2689 Other abnormalities of gait and mobility: Secondary | ICD-10-CM

## 2019-08-24 DIAGNOSIS — R262 Difficulty in walking, not elsewhere classified: Secondary | ICD-10-CM

## 2019-08-24 NOTE — Therapy (Signed)
[facility] [address]  [place], [place], [address] Phone: [phone]   Fax:  [phone]  Physical Therapy Treatment  Patient Details  Name: Katherine Levine MRN: [id number] Date of Birth: 1981/08/07 Referring Provider (PT): Arlice Colt, MD   Encounter Date: 08/24/2019  PT End of Session - 08/24/19 1723    Visit Number  3    Number of Visits  20    Date for PT Re-Evaluation  10/19/19    Authorization Type  [employer]    Authorization - Number of Visits  20    PT Start Time  [id number]   pt. arrived late to session thus tx time limited   PT Stop Time  1745    PT Time Calculation (min)  31 min    Activity Tolerance  Patient tolerated treatment well    Behavior During Therapy  [facility] for tasks assessed/performed       Past Medical History:  Diagnosis Date  . Dental crowns present    #8,9,25 per pt.  . History of kidney stones   . Left ACL tear 01/2017  . Vision abnormalities     Past Surgical History:  Procedure Laterality Date  . CESAREAN SECTION  2012; 2000   x 2  . CHOLECYSTECTOMY     age 68  . DILITATION & CURRETTAGE/HYSTROSCOPY WITH NOVASURE ABLATION  10/19/2012   Procedure: DILATATION & CURETTAGE/HYSTEROSCOPY WITH NOVASURE ABLATION;  Surgeon: Princess Bruins, MD;  Location: [facility];  Service: Gynecology;  Laterality: N/A;  . KNEE ARTHROSCOPY WITH ANTERIOR CRUCIATE LIGAMENT (ACL) REPAIR Left 02/19/2017   Procedure: LEFT KNEE ARTHROSCOPY WITH ANTERIOR CRUCIATE LIGAMENT (ACL) REPAIR ALLOGRAFT;  Surgeon: Renette Butters, MD;  Location: [facility];  Service: Orthopedics;  Laterality: Left;  . LIVER RESECTION  06/2011  . TUBAL LIGATION      There were no vitals filed for this visit.  Subjective Assessment - 08/24/19 1716    Subjective  Had a "bad episode" with L knee which felt weak and almost caused her to fall while standing on Saturday.    Pertinent History  L ACL repair 2018    How long  can you walk comfortably?  5-10 minutes (numbness progresses)    Diagnostic tests  NCV scheduled for 08/18/19    Patient Stated Goals  "I want my foot back to normal w/o the brace (AFO)"    Currently in Pain?  No/denies    Pain Score  0-No pain    Pain Location  Calf   Haven't felt cramps in calf muscles in over a week   Pain Orientation  Right    Pain Descriptors / Indicators  Constant;Cramping    Pain Type  Acute pain    Pain Radiating Towards  tingling/numbness into R lateral shin and into R toes    Pain Onset  More than a month ago    Aggravating Factors   worse at night in bed    Pain Relieving Factors  heating pad    Multiple Pain Sites  Yes    Pain Score  0   R knee pain rising to 5/10 standing or walking at times   Pain Location  Knee    Pain Orientation  Right    Pain Descriptors / Indicators  Aching    Pain Type  Acute pain    Pain Onset  In the past 7 days    Pain Frequency  Intermittent  Aggravating Factors   walking or standing at times    Pain Relieving Factors  rest                       [facility] Adult PT Treatment/Exercise - 08/24/19 0001      Knee/Hip Exercises: Stretches   Hip Flexor Stretch  Right;2 reps;30 seconds    Hip Flexor Stretch Limitations  strap     ITB Stretch  --    ITB Stretch Limitations  --      Knee/Hip Exercises: Standing   Lateral Step Up  Right;10 reps;Step Height: 8";Hand Hold: 2    Lateral Step Up Limitations  at TM rail     Forward Step Up  Right;10 reps;Step Height: 8";Hand Hold: 1    Forward Step Up Limitations  at TM rail       Knee/Hip Exercises: Supine   Other Supine Knee/Hip Exercises  Hooklying alternating red TB clam shell x 10 reps     Other Supine Knee/Hip Exercises  Hooklying R hip IR with therapist anchoring green TB x 10 reps      Ankle Exercises: Stretches   Plantar Fascia Stretch  30 seconds;1 rep    Plantar Fascia Stretch Limitations  into wall     Soleus Stretch  30 seconds;2 reps    Soleus  Stretch Limitations  into wall     Gastroc Stretch  30 seconds;2 reps    Gastroc Stretch Limitations  into wall       Ankle Exercises: Aerobic   Nustep  Lvl 4, 6 min              PT Education - 08/24/19 1806    Education Details  HEP update; forward step up, bridge + iso hip abd/ER into looped red band at knees band issued to pt.    Person(s) Educated  Patient    Methods  Explanation;Demonstration;Verbal cues;Handout    Comprehension  Verbalized understanding;Returned demonstration;Verbal cues required       PT Short Term Goals - 08/17/19 1633      PT SHORT TERM GOAL #1   Title  Independent with initial HEP    Status  On-going    Target Date  08/31/19      PT SHORT TERM GOAL #2   Title  Patient will improve R LE  strength by 1/2 grade on average for improved motor control and stability    Status  On-going    Target Date  09/14/19        PT Long Term Goals - 08/17/19 1634      PT LONG TERM GOAL #1   Title  Independent with ongoing/advanced HEP    Status  On-going      PT LONG TERM GOAL #2   Title  Decrease R LE pain by >/= 50%    Status  On-going      PT LONG TERM GOAL #3   Title  R ankle AROM WFL to allow for normal gait pattern w/o need for AFO    Status  On-going      PT LONG TERM GOAL #4   Title  Increase overall R LE strength to >/= 4/5 to 4+/5    Status  On-going      PT LONG TERM GOAL #5   Title  Walk without R AFO, all distances and surfaces with minimal deviation    Status  On-going  Plan - 08/24/19 1723    Clinical Impression Statement  Pt. arrived late to session today thus tx time limited.  Pt. reporting she had an "episode" of weakness and low HR yesterday at work which seemed to last ~ 15 min and recovered with MD from work checking her out and clearing her to go home.  Had nerve conduction velocity study on 08/18/19 which revealed peroneal neuropathy.  Neurologist ordering MRI to further examine lumbar spine.  Reports improved  HEP compliance over weekend and does feel improvement in calf cramps since starting therapy.  Pt. noting intermittent knee pain on an off over weekend.  Pt. reporting no improvement in R LE numbness/tingling since last visit.  Tolerated initiation of proximal R hip strengthening in closed chain and supine level isolated strengthening positions.  Pt. tolerated all activities well in session without pain.  Ended visit pain free thus modalities deferred.    Personal Factors and Comorbidities  Past/Current Experience;Time since onset of injury/illness/exacerbation    Rehab Potential  Good    PT Treatment/Interventions  ADLs/Self Care Home Management;Cryotherapy;Electrical Stimulation;Iontophoresis 4mg /ml Dexamethasone;Moist Heat;Ultrasound;Gait training;Stair training;Functional mobility training;Therapeutic activities;Therapeutic exercise;Balance training;Neuromuscular re-education;Patient/family education;Orthotic Fit/Training;Manual techniques;Passive range of motion;Dry needling;Taping;Spinal Manipulations;Joint Manipulations    PT Next Visit Plan  Manual therapy to address abnormal muscle tension/cramping; R ankle ROM; R LE strengthening; modalities PRN    PT Home Exercise Plan  08/10/19 - gastroc/soleus & foot intrinsic stretches, R ankle APs & circles; 08/24/19 - bridge/abduction + red band at knees, forward step up    Consulted and Agree with Plan of Care  Patient       Patient will benefit from skilled therapeutic intervention in order to improve the following deficits and impairments:  Abnormal gait, Decreased activity tolerance, Decreased balance, Decreased coordination, Decreased endurance, Decreased mobility, Decreased range of motion, Decreased safety awareness, Decreased strength, Difficulty walking, Increased muscle spasms, Impaired perceived functional ability, Impaired flexibility, Impaired sensation, Impaired tone, Pain  Visit Diagnosis: Muscle weakness (generalized)  Other symptoms and  signs involving the nervous system  Pain in right lower leg  Other abnormalities of gait and mobility  Difficulty in walking, not elsewhere classified     Problem List Patient Active Problem List   Diagnosis Date Noted  . 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 Right foot drop 07/21/2019  . Vitamin D deficiency 04/13/2019  . 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 Memory loss 05/12/2018  . B12 deficiency 05/12/2018  . 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 Visual disturbance 05/12/2018  . Left ACL tear 02/17/2017  . Palpitations 03/06/2016  . Burn 06/01/2013  . Other fatigue 09/01/2012  . DUB (dysfunctional uterine bleeding) 09/01/2012  . Allergic reaction to drug 07/13/2012  . 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 RUQ abdominal pain 03/12/2012  . Nodule on liver   . NONSPECIFIC ABN FINDING RAD & OTH EXAM GI TRACT 12/26/2010  . OBESITY, UNSPECIFIED 12/17/2010  . ANXIETY STATE, UNSPECIFIED 12/17/2010    Bess Harvest, PTA 08/24/19 [facility] [address]  [place], [place], [address] Phone: [phone]   Fax:  [phone]  Name: Katherine Levine MRN: [id number] Date of Birth: 1981-02-17

## 2019-08-25 ENCOUNTER — Telehealth: Payer: Self-pay | Admitting: Neurology

## 2019-08-25 NOTE — Telephone Encounter (Signed)
I spoke to Ms. Katherine Levine.  The MRI of the lumbar spine shows degenerative changes at L3-L4 and L4-L5 but no definite nerve root compression and no spinal stenosis.  She has improved some over the last couple weeks.  Based on her EMG/NCV and the MRI, she most likely has a peroneal neuropathy at or above the fibular head.  She will continue physical therapy.

## 2019-08-31 ENCOUNTER — Ambulatory Visit: Payer: 59

## 2019-09-01 NOTE — Progress Notes (Signed)
Cardiology Office Note:    Date:  09/02/2019   ID:  Katherine Levine, DOB 1981-06-27, MRN [id number]  PCP:  Garlan Fillers, MD  Cardiologist:  Shirlee More, MD   Referring MD: Garlan Fillers, MD  ASSESSMENT:    1. Hypoxia   2. Right peroneal nerve palsy   3. Palpitations    PLAN:    In order of problems listed above:  1. She has high healthcare literacy was symptomatic with shortness of breath therefore oxygen saturation truly was less than 70% she was profoundly hypoxic.  Despite a low d-dimer level I be concerned of pulmonary thromboembolism will recheck a d-dimer today and do a CTA.  If she has findings of acute or chronic pulmonary thromboembolism she need long-term indefinite anticoagulation.  Is already had a cardiac echo with contrast injection has no evidence of shunt I would not repeat the test. 2. Stable managed by neurology 3. She notices now that her heart rates are erratic using her pulse oximeter at risk for arrhythmia and will apply 1 week ZIO monitor to define if she has developed arrhythmia such as paroxysmal atrial fibrillation to account for her symptoms of exercise intolerance.  Next appointment 4 weeks   Medication Adjustments/Labs and Tests Ordered: Current medicines are reviewed at length with the patient today.  Concerns regarding medicines are outlined above.  No orders of the defined types were placed in this encounter.  No orders of the defined types were placed in this encounter.    Chief Complaint  Patient presents with  . Follow-up    After ED visit 08/12/2019    History of Present Illness:    Katherine Levine is a 38 y.o. female who is being seen today for the evaluation of hypoxia after emergency room visit 08/12/2019.  At the request of Garlan Fillers, MD.  Prior to the visit she had fatigue her home pulse oximeter showed saturations of 68%.  When she presented to the emergency room there was no hypoxia noted at rest and with  activity.She had a contrast echo performed in August without abnormality or venous to arterial shunt.  She is taking phentermine and has had a 72 pound weight loss in the last year Was admitted to the hospital on 07/20/2019 discharge same day with right lower extremity weakness she underwent a stroke evaluation without findings of stroke.  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  A cardiac contrast echo was performed that was normal.  She has good healthcare literacy and uses an oxygen saturation monitor to follow her heart rate with a home exercise program.  The day she went to the emergency room she developed profound weakness near loss of consciousness and was short of breath.  Her d-dimer was low she did not have a chest CTA performed.  Since then there is been no recurrence with exercise she feels very weak and has not been able to get back to her usual activity no palpitation shortness of breath chest pain or syncope.  She notes her heart rate at home now is erratic and will be in the 40s and then will be greater than 100 bpm without activity..  She recently developed a peroneal nerve palsy.  No identifiable risk factors for venous thromboembolism but the family history is incomplete as she was adopted.  Past Medical History:  Diagnosis Date  . Dental  crowns present    #8,9,25 per pt.  . History of kidney stones   . Left ACL tear 01/2017  . Vision abnormalities     Past Surgical History:  Procedure Laterality Date  . CESAREAN SECTION  2012; 2000   x 2  . CHOLECYSTECTOMY     age 33  . DILITATION & CURRETTAGE/HYSTROSCOPY WITH NOVASURE ABLATION  10/19/2012   Procedure: DILATATION & CURETTAGE/HYSTEROSCOPY WITH NOVASURE ABLATION;  Surgeon: Princess Bruins, MD;  Location: [facility];  Service: Gynecology;  Laterality: N/A;  . KNEE ARTHROSCOPY WITH ANTERIOR CRUCIATE LIGAMENT (ACL) REPAIR  Left 02/19/2017   Procedure: LEFT KNEE ARTHROSCOPY WITH ANTERIOR CRUCIATE LIGAMENT (ACL) REPAIR ALLOGRAFT;  Surgeon: Renette Butters, MD;  Location: [facility];  Service: Orthopedics;  Laterality: Left;  . LIVER RESECTION  06/2011  . TUBAL LIGATION      Current Medications: Current Meds  Medication Sig  . cetirizine (ZYRTEC) 10 MG chewable tablet Chew 10 mg by mouth as needed.   . Cholecalciferol (VITAMIN D-1000 MAX ST) 25 MCG (1000 UT) tablet Take 1 tablet by mouth daily.  . Cyanocobalamin (VITAMIN B-12) 2500 MCG SUBL Place 1 tablet under the tongue daily.  . fluticasone (FLONASE) 50 MCG/ACT nasal spray Place into both nostrils as needed.   . phentermine 37.5 MG capsule Take 1 capsule (37.5 mg total) by mouth every morning.  . rizatriptan (MAXALT-MLT) 10 MG disintegrating tablet TAKE 1 TABLET BY MOUTH AS NEEDED FOR MIGRAINE, MAY REPEAT IN 2 HOURS AS NEEDED     Allergies:   Amoxicillin, Azithromycin, Fexofenadine, Sertraline, Adhesive [tape], Ciprofloxacin, Meperidine hcl, and Penicillins   Social History   Socioeconomic History  . Marital status: Married    Spouse name: Not on file  . Number of children: Not on file  . Years of education: Not on file  . Highest education level: Not on file  Occupational History  . Not on file  Social Needs  . Financial resource strain: Not on file  . Food insecurity    Worry: Not on file    Inability: Not on file  . Transportation needs    Medical: Not on file    Non-medical: Not on file  Tobacco Use  . Smoking status: Never Smoker  . Smokeless tobacco: Never Used  Substance and Sexual Activity  . Alcohol use: Not Currently    Alcohol/week: 0.0 standard drinks  . Drug use: No  . Sexual activity: Yes    Birth control/protection: Surgical  Lifestyle  . Physical activity    Days per week: Not on file    Minutes per session: Not on file  . Stress: Not on file  Relationships  . Social Herbalist on phone: Not  on file    Gets together: Not on file    Attends religious service: Not on file    Active member of club or organization: Not on file    Attends meetings of clubs or organizations: Not on file    Relationship status: Not on file  Other Topics Concern  . Not on file  Social History Narrative  . Not on file     Family History: The patient's family history includes Cancer in her paternal aunt; Diabetes in her paternal aunt; Heart disease in her paternal aunt; Hypertension in her paternal aunt; Melanoma in her father; Stroke in her paternal aunt. She was adopted.  ROS:   Review of Systems  Constitution: Positive for malaise/fatigue.  HENT: Negative.  Eyes: Negative.   Cardiovascular: Positive for near-syncope.  Respiratory: Positive for shortness of breath.   Endocrine: Negative.   Hematologic/Lymphatic: Negative.   Skin: Negative.   Musculoskeletal: Negative.   Gastrointestinal: Negative.   Genitourinary: Negative.   Neurological: Positive for focal weakness and numbness.  Psychiatric/Behavioral: Negative.   Allergic/Immunologic: Negative.    Please see the history of present illness.     All other systems reviewed and are negative.  EKGs/Labs/Other Studies Reviewed:    The following studies were reviewed today:  Echo 07/20/2019: Conclusions Summary Normal left ventricular size and systolic function with no appreciable segmental abnormality. Ejection fraction is visually estimated at 60-65%. Diastolic function is normal. Normal right ventricle structure and function. No significant valvular stenosis or insufficiency. There is no evidence of PFO by saline contrast injection  EKG 08/12/2019: SRTH normal EKG  Bilateral lower extremity venous duplex: 08/12/2019 IMPRESSION: No evidence of deep venous thrombosis.  CXR 08/12/2019: IMPRESSION: Negative AP view of the chest.  Recent Labs: 08/12/2019: ALT 9; BUN 15; Creatinine, Ser 0.67; Hemoglobin 13.4; Platelets 210;  Potassium 3.7; Sodium 138  Recent Lipid Panel    Component Value Date/Time   LDLDIRECT 73 12/17/2010 2053    Physical Exam:    VS:  BP 114/86 (BP Location: Left Arm, Patient Position: Sitting, Cuff Size: Normal)   Pulse 89   Ht 5\' 6"  (1.676 m)   Wt 131 lb 6.4 oz (59.6 kg)   LMP 08/07/2019   SpO2 96%   BMI 21.21 kg/m     Wt Readings from Last 3 Encounters:  09/02/19 131 lb 6.4 oz (59.6 kg)  08/12/19 133 lb (60.3 kg)  07/21/19 138 lb (62.6 kg)     GEN:  Well nourished, well developed in no acute distress HEENT: Normal NECK: No JVD; No carotid bruits LYMPHATICS: No lymphadenopathy CARDIAC: RRR, no murmurs, rubs, gallops RESPIRATORY:  Clear to auscultation without rales, wheezing or rhonchi  ABDOMEN: Soft, non-tender, non-distended MUSCULOSKELETAL:  No edema; No deformity  SKIN: Warm and dry NEUROLOGIC:  Alert and oriented x 3 PSYCHIATRIC:  Normal affect     Signed, Shirlee More, MD  09/02/2019 4:26 PM    [place]

## 2019-09-02 ENCOUNTER — Other Ambulatory Visit: Payer: Self-pay

## 2019-09-02 ENCOUNTER — Ambulatory Visit (INDEPENDENT_AMBULATORY_CARE_PROVIDER_SITE_OTHER): Payer: 59 | Admitting: Cardiology

## 2019-09-02 ENCOUNTER — Encounter: Payer: Self-pay | Admitting: Cardiology

## 2019-09-02 VITALS — BP 114/86 | HR 89 | Ht 66.0 in | Wt 131.4 lb

## 2019-09-02 DIAGNOSIS — G5731 Lesion of lateral popliteal nerve, right lower limb: Secondary | ICD-10-CM | POA: Diagnosis not present

## 2019-09-02 DIAGNOSIS — R0902 Hypoxemia: Secondary | ICD-10-CM | POA: Diagnosis not present

## 2019-09-02 DIAGNOSIS — R002 Palpitations: Secondary | ICD-10-CM | POA: Diagnosis not present

## 2019-09-02 NOTE — Patient Instructions (Addendum)
Medication Instructions:  Your physician recommends that you continue on your current medications as directed. Please refer to the Current Medication list given to you today.  If you need a refill on your cardiac medications before your next appointment, please call your pharmacy.   Lab work: Your physician recommends that you have labwork drawn today: D Dimer BMET  If you have labs (blood work) drawn today and your tests are completely normal, you will receive your results only by: Marland Kitchen MyChart Message (if you have MyChart) OR . A paper copy in the mail If you have any lab test that is abnormal or we need to change your treatment, we will call you to review the results.  Testing/Procedures: Non-Cardiac CT Angiography (CTA), is a special type of CT scan that uses a computer to produce multi-dimensional views of major blood vessels throughout the body. In CT angiography, a contrast material is injected through an IV to help visualize the blood vessels. You will receive a call Monday to schedule your CTA. This will assess for acute or chronic pulmonary thromboembolism.   A Zio monitor will be mailed to your home. Please wear for 1 week and then return in the mail.   Follow-Up: With Dr. Bettina Gavia or Laurann Montana NP in [facility] in 6 weeks  Any Other Special Instructions Will Be Listed Below (If Applicable).  CT Angiogram  A CT angiogram is a procedure to look at the blood vessels in various areas of the body. For this procedure, a large X-ray machine, called a CT scanner, takes detailed pictures of blood vessels that have been injected with a dye (contrast material). A CT angiogram allows your health care provider to see how well blood is flowing to the area of your body that is being checked. Your health care provider will be able to see if there are any problems, such as a blockage. Tell a health care provider about:  Any allergies you have.  All medicines you are taking, including  vitamins, herbs, eye drops, creams, and over-the-counter medicines.  Any problems you or family members have had with anesthetic medicines.  Any blood disorders you have.  Any surgeries you have had.  Any medical conditions you have.  Whether you are pregnant or may be pregnant.  Whether you are breastfeeding.  Any anxiety disorders, chronic pain, or other conditions you have that may increase your stress or prevent you from lying still. What are the risks? Generally, this is a safe procedure. However, problems may occur, including:  Infection.  Bleeding.  Allergic reactions to medicines or dyes.  Damage to other structures or organs.  Kidney damage from the dye or contrast that is used.  Increased risk of cancer from radiation exposure. This risk is low. Talk with your health care provider about: ? The risks and benefits of testing. ? How you can receive the lowest dose of radiation. What happens before the procedure?  Wear comfortable clothing and remove any jewelry.  Follow instructions from your health care provider about eating and drinking. For most people, instructions may include these actions: ? For 12 hours before the test, avoid caffeine. This includes tea, coffee, soda, and energy drinks or pills. ? For 3-4 hours before the test, stop eating or drinking anything but water. ? Stay well hydrated by continuing to drink water before the exam. This will help to clear the contrast dye from your body after the test.  Ask your health care provider about changing or stopping  your regular medicines. This is especially important if you are taking diabetes medicines or blood thinners. What happens during the procedure?  An IV tube will be inserted into one of your veins.  You will be asked to lie on an exam table. This table will slide in and out of the CT machine during the procedure.  Contrast dye will be injected into the IV tube. You might feel warm, or you may get  a metallic taste in your mouth.  The table that you are lying on will move into the CT machine tunnel for the scan.  The person running the machine will give you instructions while the scans are being done. You may be asked to: ? Keep your arms above your head. ? Hold your breath. ? Stay very still, even if the table is moving.  When the scanning is complete, you will be moved out of the machine.  The IV tube will be removed. The procedure may vary among health care providers and hospitals. What happens after the procedure?  You might feel warm, or you may get a metallic taste in your mouth.  You may be asked to drink water or other fluids to wash (flush) the contrast material out of your body.  It is up to you to get the results of your procedure. Ask your health care provider, or the department that is doing the procedure, when your results will be ready. Summary  A CT angiogram is a procedure to look at the blood vessels in various areas of the body.  You will need to stay very still during the exam.  You may be asked to drink water or other fluids to wash (flush) the contrast material out of your body after your scan. This information is not intended to replace advice given to you by your health care provider. Make sure you discuss any questions you have with your health care provider. Document Released: 07/17/2016 Document Revised: 01/27/2019 Document Reviewed: 07/17/2016 Elsevier Patient Education  2020 [employer].

## 2019-09-03 LAB — D-DIMER, QUANTITATIVE: D-DIMER: 0.26 mg/L FEU (ref 0.00–0.49)

## 2019-09-03 LAB — BASIC METABOLIC PANEL
BUN/Creatinine Ratio: 18 (ref 9–23)
BUN: 13 mg/dL (ref 6–24)
CO2: 21 mmol/L (ref 20–29)
Calcium: 9.6 mg/dL (ref 8.7–10.2)
Chloride: 103 mmol/L (ref 96–106)
Creatinine, Ser: 0.72 mg/dL (ref 0.57–1.00)
GFR calc Af Amer: 121 mL/min/{1.73_m2} (ref >59)
GFR calc non Af Amer: 105 mL/min/{1.73_m2} (ref >59)
Glucose: 92 mg/dL (ref 65–99)
Potassium: 3.9 mmol/L (ref 3.5–5.2)
Sodium: 140 mmol/L (ref 134–144)

## 2019-09-07 ENCOUNTER — Ambulatory Visit (HOSPITAL_BASED_OUTPATIENT_CLINIC_OR_DEPARTMENT_OTHER)
Admission: RE | Admit: 2019-09-07 | Discharge: 2019-09-07 | Disposition: A | Discharge status: Home / Self Care | Payer: 59 | Source: Ambulatory Visit | Attending: Cardiology | Admitting: Cardiology

## 2019-09-07 ENCOUNTER — Encounter (HOSPITAL_BASED_OUTPATIENT_CLINIC_OR_DEPARTMENT_OTHER): Payer: Self-pay

## 2019-09-07 ENCOUNTER — Other Ambulatory Visit: Payer: Self-pay

## 2019-09-07 ENCOUNTER — Ambulatory Visit: Payer: 59 | Attending: Neurology | Admitting: Physical Therapy

## 2019-09-07 DIAGNOSIS — R2689 Other abnormalities of gait and mobility: Secondary | ICD-10-CM | POA: Diagnosis present

## 2019-09-07 DIAGNOSIS — R262 Difficulty in walking, not elsewhere classified: Secondary | ICD-10-CM | POA: Insufficient documentation

## 2019-09-07 DIAGNOSIS — M79661 Pain in right lower leg: Secondary | ICD-10-CM

## 2019-09-07 DIAGNOSIS — R29818 Other symptoms and signs involving the nervous system: Secondary | ICD-10-CM | POA: Diagnosis present

## 2019-09-07 DIAGNOSIS — R0902 Hypoxemia: Secondary | ICD-10-CM | POA: Insufficient documentation

## 2019-09-07 DIAGNOSIS — M6281 Muscle weakness (generalized): Secondary | ICD-10-CM

## 2019-09-07 DIAGNOSIS — R002 Palpitations: Secondary | ICD-10-CM | POA: Diagnosis present

## 2019-09-07 MED ORDER — IOHEXOL 350 MG/ML SOLN
100.0000 mL | Freq: Once | INTRAVENOUS | Status: AC | PRN
  Administered 2019-09-07: 100 mL via INTRAVENOUS

## 2019-09-07 MED ORDER — IOHEXOL 300 MG/ML  SOLN: 100.0000 mL | Freq: Once | INTRAMUSCULAR | Status: DC | PRN

## 2019-09-07 NOTE — Therapy (Signed)
[facility] [place] [address]  [place], [place], [address] Phone: [phone]   Fax:  [phone]  Physical Therapy Treatment  Patient Details  Name: Katherine Levine MRN: [id number] Date of Birth: 1981-01-15 Referring Provider (PT): Katherine Colt, MD   Encounter Date: 09/07/2019  PT End of Session - 09/07/19 0807    Visit Number  4    Number of Visits  20    Date for PT Re-Evaluation  10/19/19    Authorization Type  [employer]    Authorization - Number of Visits  20    PT Start Time  0807   Pt arrived late   PT Stop Time  0855    PT Time Calculation (min)  48 min    Activity Tolerance  Patient tolerated treatment well    Behavior During Therapy  [facility] for tasks assessed/performed       Past Medical History:  Diagnosis Date  . Dental crowns present    #8,9,25 per pt.  . History of kidney stones   . Left ACL tear 01/2017  . Vision abnormalities     Past Surgical History:  Procedure Laterality Date  . CESAREAN SECTION  2012; 2000   x 2  . CHOLECYSTECTOMY     age 38  . DILITATION & CURRETTAGE/HYSTROSCOPY WITH NOVASURE ABLATION  10/19/2012   Procedure: DILATATION & CURETTAGE/HYSTEROSCOPY WITH NOVASURE ABLATION;  Surgeon: Princess Bruins, MD;  Location: [facility];  Service: Gynecology;  Laterality: N/A;  . KNEE ARTHROSCOPY WITH ANTERIOR CRUCIATE LIGAMENT (ACL) REPAIR Left 02/19/2017   Procedure: LEFT KNEE ARTHROSCOPY WITH ANTERIOR CRUCIATE LIGAMENT (ACL) REPAIR ALLOGRAFT;  Surgeon: Renette Butters, MD;  Location: [facility];  Service: Orthopedics;  Laterality: Left;  . LIVER RESECTION  06/2011  . TUBAL LIGATION      There were no vitals filed for this visit.  Subjective Assessment - 09/07/19 0810    Subjective  Pt reporting she is still undergoing testing for fatigue and SOB/decreasing O2 sats with chest CT scheduled for today.    Pertinent History  L ACL repair 2018    Diagnostic tests  08/18/19 - NCV  revealing decreased peripheral nerve signals (per pt report - results not available).  08/24/19 - MRI of the lumbar spine: 1. At L3-L4, there are disc degenerative changes but no nerve root compression or spinal stenosis; 2. At L4-L5, there is a right paramedian disc protrusion but no nerve root compression or spinal stenosis.  07/19/19 - Head CT: No acute abnormality. 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    Patient Stated Goals  "I want my foot back to normal w/o the brace (AFO)"    Currently in Pain?  No/denies    Pain Onset  More than a month ago    Pain Onset  In the past 7 days         [facility] PT Assessment - 09/07/19 0807      Assessment   Medical Diagnosis  R peroneal neuropathy    Referring Provider (PT)  Katherine Colt, MD    Next MD Visit  PRN      AROM   Left Ankle Dorsiflexion  -9    Left Ankle Plantar Flexion  58    Left Ankle Inversion  34    Left Ankle Eversion  22      PROM   Right Ankle Dorsiflexion  10      Strength  Right Hip Flexion  4/5    Right Hip Extension  4/5    Right Hip External Rotation   4-/5    Right Hip Internal Rotation  4/5    Right Hip ABduction  4-/5    Right Hip ADduction  4-/5    Left Hip Flexion  4/5    Left Hip Extension  4-/5    Left Hip External Rotation  3+/5    Left Hip Internal Rotation  4/5    Left Hip ABduction  4-/5    Left Hip ADduction  4/5    Right Knee Flexion  4/5    Right Knee Extension  4/5    Left Knee Flexion  4+/5    Left Knee Extension  4/5    Right Ankle Dorsiflexion  3-/5    Right Ankle Plantar Flexion  4-/5    Right Ankle Inversion  4/5    Right Ankle Eversion  3/5    Left Ankle Dorsiflexion  4/5    Left Ankle Plantar Flexion  4/5    Left Ankle Inversion  4/5    Left Ankle Eversion  4+/5                   [facility] Adult PT Treatment/Exercise - 09/07/19 0807      Knee/Hip Exercises: Standing   Functional Squat  10 reps;3 seconds;2 sets    Functional  Squat Limitations  cues to avoid knees forward of toes; 2nd set + isometric hip abduction with red TB      Ankle Exercises: Aerobic   Nustep  L4 x 6 min      Ankle Exercises: Standing   Heel Raises  Both;10 reps;3 seconds   2 sets   Heel Raises Limitations  2nd set - B con/R ecc      Ankle Exercises: Seated   Other Seated Ankle Exercises  R ankle 4-way with yellow TB x 10 each             PT Education - 09/07/19 0855    Education Details  HEP update - 4-way ankle with yellow TB, B con/R ecc heel raises, squat + iso hip ABD with red TB    Person(s) Educated  Patient    Methods  Explanation;Demonstration;Handout    Comprehension  Verbalized understanding;Returned demonstration;Need further instruction       PT Short Term Goals - 09/07/19 0845      PT SHORT TERM GOAL #1   Title  Independent with initial HEP    Status  Achieved   09/07/19     PT SHORT TERM GOAL #2   Title  Patient will improve R LE strength by 1/2 grade on average for improved motor control and stability    Status  Achieved   09/07/19       PT Long Term Goals - 08/17/19 1634      PT LONG TERM GOAL #1   Title  Independent with ongoing/advanced HEP    Status  On-going      PT LONG TERM GOAL #2   Title  Decrease R LE pain by >/= 50%    Status  On-going      PT LONG TERM GOAL #3   Title  R ankle AROM WFL to allow for normal gait pattern w/o need for AFO    Status  On-going      PT LONG TERM GOAL #4   Title  Increase overall R LE strength to >/= 4/5  to 4+/5    Status  On-going      PT LONG TERM GOAL #5   Title  Walk without R AFO, all distances and surfaces with minimal deviation    Status  On-going            Plan - 09/07/19 0855    Clinical Impression Statement  [name] returning after 2-week absence reporting continued testing to determine cause of her issues and deficits remains inconclusive. She has demonstrated significant gains in R ankle AROM and strength but still lacks 9 degrees  from neutral DF AROM (much improved from initial 31 dg deficit from neutral, but still >20 dg from normal DF AROM) although eversion AROM now WFL/WNL. R LE strength also grossly improved but mild increased weakness noted with some MMT on L and patient reporting new sense on instability in L knee. Given ongoing R ankle DF ROM and strength deficits, encouraged patient to continue to wear AFO currently but will work toward attempting to wean AFO as ROM and strength improves. Progressed strengthening program today with addition of yellow TB resisted R ankle ROM/strengthening as well as weight bearing heel raises and squats with HEP updated accordingly. Patient progressing well with PT POC with all STGs now met.    Rehab Potential  Good    PT Frequency  2x / week   potentially tapering to 1x/wk   PT Duration  Other (comment)   10 wks   PT Treatment/Interventions  ADLs/Self Care Home Management;Cryotherapy;Electrical Stimulation;Iontophoresis 26m/ml Dexamethasone;Moist Heat;Ultrasound;Gait training;Stair training;Functional mobility training;Therapeutic activities;Therapeutic exercise;Balance training;Neuromuscular re-education;Patient/family education;Orthotic Fit/Training;Manual techniques;Passive range of motion;Dry needling;Taping;Spinal Manipulations;Joint Manipulations    PT Next Visit Plan  R ankle ROM; B LE strengthening; manual therapy to address abnormal muscle tension/cramping PRN; modalities PRN    PT Home Exercise Plan  08/10/19 - gastroc/soleus & foot intrinsic stretches, R ankle APs & circles; 08/24/19 - bridge + iso hip abduction with red TB, forward step up; 09/07/19 - 4-way ankle with yellow TB, B con/R ecc heel raises, squat + iso hip ABD with red TB    Consulted and Agree with Plan of Care  Patient       Patient will benefit from skilled therapeutic intervention in order to improve the following deficits and impairments:     Visit Diagnosis: Muscle weakness (generalized)  Other symptoms  and signs involving the nervous system  Pain in right lower leg  Other abnormalities of gait and mobility  Difficulty in walking, not elsewhere classified     Problem List Patient Active Problem List   Diagnosis Date Noted  . 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 Right foot drop 07/21/2019  . Vitamin D deficiency 04/13/2019  . 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 Memory loss 05/12/2018  . B12 deficiency 05/12/2018  . 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 Visual disturbance 05/12/2018  . Left ACL tear 02/17/2017  . Palpitations 03/06/2016  . Burn 06/01/2013  . Other fatigue 09/01/2012  . DUB (dysfunctional uterine bleeding) 09/01/2012  . Allergic reaction to drug 07/13/2012  . 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 RUQ abdominal pain 03/12/2012  . Nodule on liver   . NONSPECIFIC ABN FINDING RAD & OTH EXAM GI TRACT 12/26/2010  . OBESITY, UNSPECIFIED 12/17/2010  . ANXIETY STATE, UNSPECIFIED 12/17/2010    JPercival Spanish PT, MPT 09/07/2019, 1:43 PM  [facility] [place] [address]Phone: [phone]  Fax:  [phone] Name: Katherine  Kishana Levine MRN: [id number] Date of Birth: 16-Feb-1981

## 2019-09-07 NOTE — Patient Instructions (Signed)
    Home exercise program created by JoAnne Kreis, PT.  For questions, please contact JoAnne via phone at [phone] or email at [email or web address]  [facility] [address]  [address] [place], [place], [address] Phone: [phone]   Fax:  [phone]    

## 2019-09-09 ENCOUNTER — Ambulatory Visit (INDEPENDENT_AMBULATORY_CARE_PROVIDER_SITE_OTHER): Payer: 59

## 2019-09-09 DIAGNOSIS — R002 Palpitations: Secondary | ICD-10-CM

## 2019-09-16 ENCOUNTER — Telehealth: Payer: Self-pay | Admitting: Cardiology

## 2019-09-16 NOTE — Telephone Encounter (Signed)
Lam for patient to call back regarding FMLA paperwork. Paperwork received but needing papers to be signed and check or money order for $25 to get started as stated earlier on phone call.

## 2019-09-19 ENCOUNTER — Telehealth: Payer: Self-pay | Admitting: *Deleted

## 2019-09-19 NOTE — Telephone Encounter (Signed)
Fee due for FMLA form. Lt pt a voice mail.

## 2019-09-20 ENCOUNTER — Encounter: Payer: 59 | Admitting: Neurology

## 2019-09-28 ENCOUNTER — Encounter: Payer: Self-pay | Admitting: Physical Therapy

## 2019-09-28 ENCOUNTER — Ambulatory Visit: Payer: 59 | Admitting: Physical Therapy

## 2019-09-28 ENCOUNTER — Other Ambulatory Visit: Payer: Self-pay

## 2019-09-28 DIAGNOSIS — R2689 Other abnormalities of gait and mobility: Secondary | ICD-10-CM

## 2019-09-28 DIAGNOSIS — M6281 Muscle weakness (generalized): Secondary | ICD-10-CM | POA: Diagnosis not present

## 2019-09-28 DIAGNOSIS — M79661 Pain in right lower leg: Secondary | ICD-10-CM

## 2019-09-28 DIAGNOSIS — R262 Difficulty in walking, not elsewhere classified: Secondary | ICD-10-CM

## 2019-09-28 DIAGNOSIS — R29818 Other symptoms and signs involving the nervous system: Secondary | ICD-10-CM

## 2019-09-28 NOTE — Therapy (Addendum)
[facility] [place] [address]  [place], [place], [address] Phone: [phone]   Fax:  [phone]  Physical Therapy Treatment / Discharge Summary  Patient Details  Name: Katherine Levine MRN: [id number] Date of Birth: 08-May-1981 Referring Provider (PT): Arlice Colt, MD   Encounter Date: 09/28/2019  PT End of Session - 09/28/19 0804    Visit Number  5    Number of Visits  20    Date for PT Re-Evaluation  10/19/19    Authorization Type  [employer]    Authorization - Number of Visits  20    PT Start Time  0804    PT Stop Time  0852    PT Time Calculation (min)  48 min    Activity Tolerance  Patient tolerated treatment well    Behavior During Therapy  [facility] for tasks assessed/performed       Past Medical History:  Diagnosis Date  . Dental crowns present    #8,9,25 per pt.  . History of kidney stones   . Left ACL tear 01/2017  . Vision abnormalities     Past Surgical History:  Procedure Laterality Date  . CESAREAN SECTION  2012; 2000   x 2  . CHOLECYSTECTOMY     age 38  . DILITATION & CURRETTAGE/HYSTROSCOPY WITH NOVASURE ABLATION  10/19/2012   Procedure: DILATATION & CURETTAGE/HYSTEROSCOPY WITH NOVASURE ABLATION;  Surgeon: Princess Bruins, MD;  Location: [facility];  Service: Gynecology;  Laterality: N/A;  . KNEE ARTHROSCOPY WITH ANTERIOR CRUCIATE LIGAMENT (ACL) REPAIR Left 02/19/2017   Procedure: LEFT KNEE ARTHROSCOPY WITH ANTERIOR CRUCIATE LIGAMENT (ACL) REPAIR ALLOGRAFT;  Surgeon: Renette Butters, MD;  Location: [facility];  Service: Orthopedics;  Laterality: Left;  . LIVER RESECTION  06/2011  . TUBAL LIGATION      There were no vitals filed for this visit.  Subjective Assessment - 09/28/19 0806    Subjective  Pt arriving to PT w/o AFO today, reporting she feels like she is doing better and has stopped wearing AFO. Feels like her strength is 80% better but still experiencing the numbness and  tingling which worries her.    Pertinent History  L ACL repair 2018    Diagnostic tests  08/18/19 - NCV revealing decreased peripheral nerve signals (per pt report - results not available).  08/24/19 - MRI of the lumbar spine: 1. At L3-L4, there are disc degenerative changes but no nerve root compression or spinal stenosis; 2. At L4-L5, there is a right paramedian disc protrusion but no nerve root compression or spinal stenosis.  07/19/19 - Head CT: No acute abnormality. 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    Patient Stated Goals  "I want my foot back to normal w/o the brace (AFO)"    Currently in Pain?  No/denies    Pain Onset  More than a month ago    Pain Onset  In the past 7 days         [facility] PT Assessment - 09/28/19 0804      AROM   Left Ankle Dorsiflexion  6                   [facility] Adult PT Treatment/Exercise - 09/28/19 0804      Knee/Hip Exercises: Stretches   ITB Stretch  Right;30 seconds;2 reps    ITB Stretch Limitations  supine with strap & standing lateral flexion  Knee/Hip Exercises: Standing   Hip Flexion  Right;Left;10 reps;Knee straight;Stengthening    Hip Flexion Limitations  red TB, intermittent UE support on back of chair    Hip ADduction  Right;Left;10 reps;Strengthening    Hip ADduction Limitations  red TB, intermittent UE support on back of chair    Hip Abduction  Right;Left;10 reps;Knee straight;Stengthening    Abduction Limitations  red TB, intermittent UE support on back of chair    Hip Extension  Right;Left;10 reps;Knee straight;Stengthening    Extension Limitations  red TB, intermittent UE support on back of chair      Knee/Hip Exercises: Supine   Bridges with Clamshell  Both;10 reps;Strengthening   alt hip ABD/ER with red TB     Knee/Hip Exercises: Sidelying   Clams  B clam with red TB x 10 each      Ankle Exercises: Aerobic   Recumbent Bike  L2 x 6 min      Ankle Exercises: Seated    Other Seated Ankle Exercises  R ankle 4-way with red TB x 10 each             PT Education - 09/28/19 0852    Education Details  HEP update - progressed 4-way ankle to red TB & provided green TB for self-progression at home, added red TB clam to bridge, new exercises: red TB sidelying clam, 4-way standing SLR with red TB    Person(s) Educated  Patient    Methods  Explanation;Demonstration    Comprehension  Verbalized understanding       PT Short Term Goals - 09/07/19 0845      PT SHORT TERM GOAL #1   Title  Independent with initial HEP    Status  Achieved   09/07/19     PT SHORT TERM GOAL #2   Title  Patient will improve R LE strength by 1/2 grade on average for improved motor control and stability    Status  Achieved   09/07/19       PT Long Term Goals - 09/28/19 0852      PT LONG TERM GOAL #1   Title  Independent with ongoing/advanced HEP    Status  Partially Met      PT LONG TERM GOAL #2   Title  Decrease R LE pain by >/= 50%    Status  Partially Met   pain improving but ongoing numbness & tingling     PT LONG TERM GOAL #3   Title  R ankle AROM WFL to allow for normal gait pattern w/o need for AFO    Status  Achieved      PT LONG TERM GOAL #4   Title  Increase overall R LE strength to >/= 4/5 to 4+/5    Status  On-going      PT LONG TERM GOAL #5   Title  Walk without R AFO, all distances and surfaces with minimal deviation    Status  Achieved            Plan - 09/28/19 0810    Clinical Impression Statement  [name] reporting that she feels like her ankle strength is back to ~80% of normal and now able to demonstrate functional DF beyond neutral allowing her to self-wean from AFO. She continues to note intermittent numbness and tingling in distal LE especially after sitting on the edge of a stool while performing - discussed potential for sciatic nerve irritation when sitting as described, encouraging her to sit where  weight more evenly distributed over  ischial tuberosities and posterior thighs. She also notes some lateral R hip pain with localized ttp over greater trochanter, therefore introduced stretching for lateral hip as well as progress core/hip strengthening. Patient expressing preference to continue with qow frequency due to difficulty scheduling on a weekly basis, therefore provided guidance in continued self-progression of existing and new HEP exercises. Will plan to reassess progress in 2 weeks and determine readiness for transition to HEP vs need for recert.    Personal Factors and Comorbidities  Past/Current Experience;Time since onset of injury/illness/exacerbation    Rehab Potential  Good    PT Frequency  2x / week   potentially tapering to 1x/wk   PT Duration  Other (comment)   10 wks   PT Treatment/Interventions  ADLs/Self Care Home Management;Cryotherapy;Electrical Stimulation;Iontophoresis 43m/ml Dexamethasone;Moist Heat;Ultrasound;Gait training;Stair training;Functional mobility training;Therapeutic activities;Therapeutic exercise;Balance training;Neuromuscular re-education;Patient/family education;Orthotic Fit/Training;Manual techniques;Passive range of motion;Dry needling;Taping;Spinal Manipulations;Joint Manipulations    PT Next Visit Plan  Assess progress toward goals to determine need for recert vs. readiness to transition to HEP; R ankle ROM; B LE strengthening; manual therapy to address abnormal muscle tension/cramping PRN; modalities PRN    PT Home Exercise Plan  08/10/19 - gastroc/soleus & foot intrinsic stretches, R ankle APs & circles; 08/24/19 - bridge + iso hip abduction with red TB (progressed to alt clam with red TB 10/28), forward step up; 09/07/19 - 4-way ankle with yellow TB (progressed to red TB 10/28 & provided green TB for self-progression at home), B con/R ecc heel raises, squat + iso hip ABD with red TB; 09/28/19 - red TB sidelying clam (green TB provided for self-porgression at home), 4-way standing SLR with red TB     Consulted and Agree with Plan of Care  Patient       Patient will benefit from skilled therapeutic intervention in order to improve the following deficits and impairments:  Abnormal gait, Decreased activity tolerance, Decreased balance, Decreased coordination, Decreased endurance, Decreased mobility, Decreased range of motion, Decreased safety awareness, Decreased strength, Difficulty walking, Increased muscle spasms, Impaired perceived functional ability, Impaired flexibility, Impaired sensation, Impaired tone, Pain  Visit Diagnosis: Muscle weakness (generalized)  Other symptoms and signs involving the nervous system  Pain in right lower leg  Other abnormalities of gait and mobility  Difficulty in walking, not elsewhere classified     Problem List Patient Active Problem List   Diagnosis Date Noted  . 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 Right foot drop 07/21/2019  . Vitamin D deficiency 04/13/2019  . 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 Memory loss 05/12/2018  . B12 deficiency 05/12/2018  . 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 Visual disturbance 05/12/2018  . Left ACL tear 02/17/2017  . Palpitations 03/06/2016  . Burn 06/01/2013  . Other fatigue 09/01/2012  . DUB (dysfunctional uterine bleeding) 09/01/2012  . Allergic reaction to drug 07/13/2012  . 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 RUQ abdominal pain 03/12/2012  . Nodule on liver   . NONSPECIFIC ABN FINDING RAD & OTH EXAM GI TRACT 12/26/2010  . OBESITY, UNSPECIFIED 12/17/2010  . ANXIETY STATE, UNSPECIFIED 12/17/2010    JPercival Spanish PT, MPT 09/28/2019, 9:11 AM  [facility] [place] [address]Phone: [phone]  Fax:  [phone] Name: TJyl ChicoMRN: [id number] of Birth: [birth year]  PHYSICAL THERAPY DISCHARGE SUMMARY  Visits from Start of Care: 5  Current functional level related  to goals / functional  outcomes:   Refer to above clinical impression for status as of last visit on 09/28/2019. Patient noting 80% improvement was to return in 2 weeks to determine need for recert vs readiness for discharge, however she has not returned in >30 days, therefore will proceed with discharge from PT.   Remaining deficits:   As above.   Education / Equipment:   HEP  Plan: Patient agrees to discharge.  Patient goals were partially met. Patient is being discharged due to not returning since the last visit.  ?????     Percival Spanish, PT, MPT 11/09/19, 11:05 AM  [facility] [address]  [place], [place], [address] Phone: [phone]   Fax:  [phone]

## 2019-09-28 NOTE — Patient Instructions (Signed)
    Home exercise program created by JoAnne Kreis, PT.  For questions, please contact JoAnne via phone at [phone] or email at [email or web address]  [facility] [address]  [address] [place], [place], [address] Phone: [phone]   Fax:  [phone]    

## 2019-10-08 ENCOUNTER — Other Ambulatory Visit: Payer: Self-pay | Admitting: Neurology

## 2019-10-12 ENCOUNTER — Ambulatory Visit: Payer: 59 | Admitting: Family

## 2019-10-12 ENCOUNTER — Encounter: Payer: Self-pay | Admitting: Family

## 2019-10-12 ENCOUNTER — Ambulatory Visit (INDEPENDENT_AMBULATORY_CARE_PROVIDER_SITE_OTHER): Payer: 59 | Admitting: Family

## 2019-10-12 ENCOUNTER — Other Ambulatory Visit: Payer: Self-pay

## 2019-10-12 VITALS — BP 122/78 | HR 60 | Ht 66.0 in | Wt 124.0 lb

## 2019-10-12 DIAGNOSIS — I491 Atrial premature depolarization: Secondary | ICD-10-CM

## 2019-10-12 DIAGNOSIS — R0902 Hypoxemia: Secondary | ICD-10-CM | POA: Diagnosis not present

## 2019-10-12 DIAGNOSIS — R002 Palpitations: Secondary | ICD-10-CM

## 2019-10-12 DIAGNOSIS — G5731 Lesion of lateral popliteal nerve, right lower limb: Secondary | ICD-10-CM

## 2019-10-12 DIAGNOSIS — I493 Ventricular premature depolarization: Secondary | ICD-10-CM

## 2019-10-12 MED ORDER — ACEBUTOLOL HCL 200 MG PO CAPS: 200.0000 mg | ORAL_CAPSULE | ORAL | 2 refills | Status: DC | PRN

## 2019-10-12 NOTE — Patient Instructions (Addendum)
Medication Instructions:  Your physician has recommended you make the following change in your medication:  START Acebutolol 200mg  (one tablet) as needed for palpitations or elevated heart rates.  *If you need a refill on your cardiac medications before your next appointment, please call your pharmacy*  Lab Work: No lab work today.   If you have labs (blood work) drawn today and your tests are completely normal, you will receive your results only by: Marland Kitchen MyChart Message (if you have MyChart) OR . A paper copy in the mail If you have any lab test that is abnormal or we need to change your treatment, we will call you to review the results.  Testing/Procedures: None today.  Follow-Up: At [facility], you and your health needs are our priority.  As part of our continuing mission to provide you with exceptional heart care, we have created designated Provider Care Teams.  These Care Teams include your primary Cardiologist (physician) and Advanced Practice Providers (APPs -  Physician Assistants and Nurse Practitioners) who all work together to provide you with the care you need, when you need it.  Your next appointment:   6 months  The format for your next appointment:   In Person  Provider:   You may see Shirlee More, MD or the following Advanced Practice Provider on your designated Care Team:    Laurann [place], FNP   Other Instructions  Acebutolol capsules What is this medicine? ACEBUTOLOL (a se BYOO toe lole) is a beta-blocker. Beta-blockers reduce the workload on the heart and help it to beat more regularly. This medicine is used to treat high blood pressure and to treat or prevent certain heart rhythm problems. This medicine may be used for other purposes; ask your health care provider or pharmacist if you have questions. COMMON BRAND NAME(S): Sectral What should I tell my health care provider before I take this medicine? They need to know if you have any of these conditions:   diabetes  heart or vessel disease like slow heartrate, worsening heart failure, heart block, sick sinus syndrome or Raynaud's disease  kidney disease  liver disease  lung or breathing disease, like asthma or emphysema  pheochromocytoma  thyroid disease  an unusual or allergic reaction to acebutolol, other beta-blockers, medicines, foods, dyes, or preservatives  pregnant or trying to get pregnant  breast-feeding How should I use this medicine? Take this medicine by mouth with a glass of water. Follow the directions on the prescription label. You can take this medicine with or without food. Take your doses at regular intervals. Do not take your medicine more often than directed. Do not stop taking this medicine suddenly. This could lead to serious heart-related effects. Talk to your pediatrician regarding the use of this medicine in children. Special care may be needed. Overdosage: If you think you have taken too much of this medicine contact a poison control center or emergency room at once. NOTE: This medicine is only for you. Do not share this medicine with others. What if I miss a dose? If you miss a dose, take it as soon as you can. If it is almost time for your next dose, take only that dose. Do not take double or extra doses. What may interact with this medicine? This medicine may interact with the following medications:  certain medicines for blood pressure, heart disease, irregular heart beat  NSAIDS, medicines for pain and inflammation, like ibuprofen or naproxen This list may not describe all possible interactions. Give your  health care provider a list of all the medicines, herbs, non-prescription drugs, or dietary supplements you use. Also tell them if you smoke, drink alcohol, or use illegal drugs. Some items may interact with your medicine. What should I watch for while using this medicine? Visit your doctor or health care professional for regular checks on your  progress. Check your heart rate and blood pressure regularly while you are taking this medicine. Ask your doctor or health care professional what your heart rate and blood pressure should be, and when you should contact him or her. You may get drowsy or dizzy. Do not drive, use machinery, or do anything that needs mental alertness until you know how this drug affects you. Do not stand or sit up quickly, especially if you are an older patient. This reduces the risk of dizzy or fainting spells. Alcohol can make you more drowsy and dizzy. Avoid alcoholic drinks. This medicine may increase blood sugar. Ask your healthcare provider if changes in diet or medicines are needed if you have diabetes. Do not treat yourself for coughs, colds, or pain while you are taking this medicine without asking your doctor or health care professional for advice. Some ingredients may increase your blood pressure. What side effects may I notice from receiving this medicine? Side effects that you should report to your doctor or health care professional as soon as possible:  allergic reactions like skin rash, itching or hives, swelling of the face, lips, or tongue  breathing problems  chest pain  cold, tingling, or numb hands or feet  confusion  irregular heartbeat  muscle aches and pains   signs and symptoms of high blood sugar such as being more thirsty or hungry or having to urinate more than normal. You may also feel very tired or have blurry vision.  slow heart rate  sweating  swollen legs or ankles  tremor, shakes  vomiting Side effects that usually do not require medical attention (report to your doctor or health care professional if they continue or are bothersome):  anxiety  change in sex drive or performance  depression  diarrhea  dry or burning eyes  headache  nausea This list may not describe all possible side effects. Call your doctor for medical advice about side effects. You may  report side effects to FDA at 1-800-FDA-1088. Where should I keep my medicine? Keep out of the reach of children. Store at room temperature between 15 and 30 degrees C (59 and 86 degrees F). Protect from light. Keep container tightly closed. Throw away any unused medicine after the expiration date. NOTE: This sheet is a summary. It may not cover all possible information. If you have questions about this medicine, talk to your doctor, pharmacist, or health care provider.  2020 Elsevier/Gold Standard (2018-09-07 10:15:58)  Palpitations Palpitations are feelings that your heartbeat is not normal. Your heartbeat may feel like it is:  Uneven.  Faster than normal.  Fluttering.  Skipping a beat. This is usually not a serious problem. In some cases, you may need tests to rule out any serious problems. Follow these instructions at home: Pay attention to any changes in your condition. Take these actions to help manage your symptoms: Eating and drinking  Avoid: ? Coffee, tea, soft drinks, and energy drinks. ? Chocolate. ? Alcohol. ? Diet pills. Lifestyle   Try to lower your stress. These things can help you relax: ? Yoga. ? Deep breathing and meditation. ? Exercise. ? Using words and images to create  positive thoughts (guided imagery). ? Using your mind to control things in your body (biofeedback).  Do not use drugs.  Get plenty of rest and sleep. Keep a regular bed time. General instructions   Take over-the-counter and prescription medicines only as told by your doctor.  Do not use any products that contain nicotine or tobacco, such as cigarettes and e-cigarettes. If you need help quitting, ask your doctor.  Keep all follow-up visits as told by your doctor. This is important. You may need more tests if palpitations do not go away or get worse. Contact a doctor if:  Your symptoms last more than 24 hours.  Your symptoms occur more often. Get help right away if you:  Have  chest pain.  Feel short of breath.  Have a very bad headache.  Feel dizzy.  Pass out (faint). Summary  Palpitations are feelings that your heartbeat is uneven or faster than normal. It may feel like your heart is fluttering or skipping a beat.  Avoid food and drinks that may cause palpitations. These include caffeine, chocolate, and alcohol.  Try to lower your stress. Do not smoke or use drugs.  Get help right away if you faint or have chest pain, shortness of breath, a severe headache, or dizziness. This information is not intended to replace advice given to you by your health care provider. Make sure you discuss any questions you have with your health care provider. Document Released: 08/26/2008 Document Revised: 12/30/2017 Document Reviewed: 12/30/2017 Elsevier Patient Education  2020 [email or web address]      FDA-cleared, clinical grade mobile EKG monitor: Jodelle Red is the most clinically-validated mobile EKG used by the world's leading cardiac care medical professionals With Basic service, know instantly if your heart rhythm is normal or if atrial fibrillation is detected, and email the last single EKG recording to yourself or your doctor Premium service, available for purchase through the [employer] app for $9.99 per month or $99 per year, includes unlimited history and storage of your EKG recordings, a monthly EKG summary report to share with your doctor, along with the ability to track your blood pressure, activity and weight Includes one KardiaMobile phone clip FREE SHIPPING: Standard delivery 1-3 business days. Orders placed by 11:00am PST will ship that afternoon. Otherwise, will ship next business day. All orders ship via [employer] from [place], [place]

## 2019-10-12 NOTE — Progress Notes (Signed)
Office Visit    Patient Name: Katherine Levine Date of Encounter: 10/12/2019  Primary Care Provider:  Garlan Fillers, MD Primary Cardiologist:  Shirlee More, MD Electrophysiologist:  None   Chief Complaint    Katherine Levine is a 38 y.o. female with a hx of R peroneal nerve plasy, palpitations, hypoxia, dizziness presents today for follow up after ZIO.   Past Medical History    Past Medical History:  Diagnosis Date  . Dental crowns present    #8,9,25 per pt.  . History of kidney stones   . Left ACL tear 01/2017  . Vision abnormalities    Past Surgical History:  Procedure Laterality Date  . CESAREAN SECTION  2012; 2000   x 2  . CHOLECYSTECTOMY     age 70  . DILITATION & CURRETTAGE/HYSTROSCOPY WITH NOVASURE ABLATION  10/19/2012   Procedure: DILATATION & CURETTAGE/HYSTEROSCOPY WITH NOVASURE ABLATION;  Surgeon: Princess Bruins, MD;  Location: [facility];  Service: Gynecology;  Laterality: N/A;  . HEMORRHOID SURGERY    . KNEE ARTHROSCOPY WITH ANTERIOR CRUCIATE LIGAMENT (ACL) REPAIR Left 02/19/2017   Procedure: LEFT KNEE ARTHROSCOPY WITH ANTERIOR CRUCIATE LIGAMENT (ACL) REPAIR ALLOGRAFT;  Surgeon: Renette Butters, MD;  Location: [facility];  Service: Orthopedics;  Laterality: Left;  . LIVER RESECTION  06/2011  . TUBAL LIGATION      Allergies  Allergies  Allergen Reactions  . Amoxicillin Itching and Nausea And Vomiting  . Azithromycin Itching and Nausea And Vomiting  . Fexofenadine Itching and Nausea And Vomiting  . Sertraline Other (See Comments)    TREMORS  . Adhesive [Tape] Rash  . Ciprofloxacin Rash  . Meperidine Hcl Itching and Rash  . Penicillins Itching    History of Present Illness    Katherine Levine is a 38 y.o. female with a hx of right peroneal nerve palsy, palpitations, hypoxia, dizziness last seen 09/02/2019 by Dr. Bettina Gavia.  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  She was referred for evaluation of  hypoxia and palpitations after ED visit 08/12/2019.  She had an echo 07/2019 without abnormality, no venous to atrial shunt.  She is taking phentermine and has had a 86 pound weight loss in the last year.  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  Reports current "episodes ".  Tells me she had one last week that was particularly bad with feeling lightheaded and near syncopal.  Tells me she went to tell her husband something and then extreme and she suddenly felt she needed to sit down and she could not get her thoughts out as well as she wanted.  It only lasted a couple of minutes.  She is reassured that there was no evidence of dangerous arrhythmia on her ZIO monitor.  We reviewed these findings in depth.  ZIO 09/2019 showed infrequent PVC, PAC.  It also showed short bursts of Mobitz type I second-degree heart block.  There were no significant pauses of 3 seconds or more.  There was no atrial fibrillation, atrial flutter, SVT.  She tells me she has unfortunately lost her job working for her primary care office.  We discussed that stress can contribute to increased frequency of PVCs and PACs.  Discussed other risk factors.  She takes no over-the-counter medications, does  not drink, does not smoke.  EKGs/Labs/Other Studies Reviewed:   The following studies were reviewed today: ZIO Oct 22, 2019 A ZIO monitor was performed for 8 days 19 hours beginning 09/09/2019 to assess palpitation and hypoxia.   The rhythm throughout was sinus with minimum, average and maximum heart rates of 56, 93 and 173 bpm.   There are no pauses of 3 seconds or greater or episodes of high degree AV or sinus node block.   Ventricular ectopy was rare with PVCs   Supraventricular arrhythmia was rare with isolated  APCs and no episodes of SVT atrial fibrillation or flutter.   There were 45 triggered and 16 diary events.  The triggered events were associated 7 times with the presence of ventricular premature contractions and 2 with atrial premature beats.  The remainder were sinus and sinus tachycardia.  The 16 diary events encompass chest pain palpitation lightheadedness and dizziness.  You were associated with PVCs the rest sinus and sinus tachycardia   Rare episodes of Mobitz 1 second-degree AV block were seen without significant pauses     Conclusion, rare ventricular and supraventricular ectopy.  The 2 triggered and diary events predominantly unassociated with arrhythmia.  Rare Mobitz 1 second-degree AV block without pauses.  Echo 07/20/2019: Conclusions Summary Normal left ventricular size and systolic function with no appreciable segmental abnormality. Ejection fraction is visually estimated at 60-65%. Diastolic function is normal. Normal right ventricle structure and function. No significant valvular stenosis or insufficiency. There is no evidence of PFO by saline contrast injection   Bilateral lower extremity venous duplex: 08/12/2019 IMPRESSION: No evidence of deep venous thrombosis.   CXR 08/12/2019: IMPRESSION: Negative AP view of the chest.   CT Angio Chest 09/07/19 IMPRESSION: 1. No pulmonary embolism. 2. The lungs are clear. No specific finding to explain the patient's shortness of breath.  EKG: No EKG today  Recent Labs: 08/12/2019: ALT 9; Hemoglobin 13.4; Platelets 210 09/02/2019: BUN 13; Creatinine, Ser 0.72; Potassium 3.9; Sodium 140  Recent Lipid Panel    Component Value Date/Time   LDLDIRECT 73 12/17/2010 2053    Home Medications   Current Meds  Medication Sig  . cetirizine (ZYRTEC) 10 MG chewable tablet Chew 10 mg by mouth as needed.   . Cholecalciferol (VITAMIN D-1000 MAX ST) 25 MCG (1000 UT) tablet Take 1 tablet by mouth daily.  . Cyanocobalamin (VITAMIN  B-12) 2500 MCG SUBL Place 1 tablet under the tongue daily.  . fluticasone (FLONASE) 50 MCG/ACT nasal spray Place into both nostrils as needed.   . phentermine 37.5 MG capsule TAKE 1 CAPSULE(37.5 MG) BY MOUTH EVERY MORNING  . rizatriptan (MAXALT-MLT) 10 MG disintegrating tablet TAKE 1 TABLET BY MOUTH AS NEEDED FOR MIGRAINE, MAY REPEAT IN 2 HOURS AS NEEDED      Review of Systems      Review of Systems  Constitution: Positive for malaise/fatigue. Negative for chills and fever.  Cardiovascular: Positive for near-syncope and palpitations. Negative for chest pain, dyspnea on exertion, leg swelling and syncope.  Respiratory: Negative for cough, shortness of breath and wheezing.   Gastrointestinal: Negative for nausea and vomiting.  Neurological: Positive for dizziness, light-headedness and weakness.   All other systems reviewed and are otherwise negative except as noted above.  Physical Exam    VS:  BP 122/78 (BP Location: Left Arm, Patient Position: Sitting, Cuff Size: Normal)   Pulse 60   Ht 5\' 6"  (1.676 m)   Wt 124 lb (56.2 kg)   SpO2 99%   BMI 20.01  kg/m  , BMI Body mass index is 20.01 kg/m. GEN: Well nourished, well developed, in no acute distress. HEENT: normal. Neck: Supple, no JVD, carotid bruits, or masses. Cardiac: RRR, no murmurs, rubs, or gallops. No clubbing, cyanosis, edema.  Radials/DP/PT 2+ and equal bilaterally.  Respiratory:  Respirations regular and unlabored, clear to auscultation bilaterally. GI: Soft, nontender, nondistended, BS + x 4. MS: No deformity or atrophy. Skin: Warm and dry, no rash. Neuro:  Strength and sensation are intact. Psych: Normal affect.  Assessment & Plan    1. Hypoxia - No recurrent episodes of hypoxia on home O2 titration.  CTA 09/07/19 negative for PE.  Echo 07/2019 with no evidence of shunt. Encouraged follow up with pulmonology as recommended by PCP.  2. Palpitations -intermittent palpitations.  ZIO monitor 09/2019 shows infrequent PVC,  PAC.  She did have a few of her triggered episodes that were associated with PVC however majority of episodes were sinus rhythm.  She will start as needed acebutolol 200 mg for episodes of palpitations or elevated heart rates.  We discussed that Phenteramine could be contributory to these episodes and she will discuss discontinuation with her neurologist.  3. Dizziness - Reports intermittent episodes of dizziness.  No noted aggravating or relieving factors.  Reassurance provided that no telemetry findings on the ZIO monitor contributory to dizziness.  We discussed the possibility of orthostatic hypotension, she was encouraged to hydrate and eat regular meals.  Encouraged to follow-up with ENT as recommended by her PCP.  4. Right peroneal nerve palsy -stable managed by neurology.  Disposition: Follow up in 6 month(s) with Dr. Earney Navy, NP 10/12/2019, 3:38 PM

## 2019-10-14 ENCOUNTER — Ambulatory Visit: Payer: 59 | Admitting: Family

## 2019-10-14 ENCOUNTER — Ambulatory Visit: Payer: 59 | Admitting: Physical Therapy

## 2019-10-14 ENCOUNTER — Encounter: Payer: Self-pay | Admitting: Family

## 2019-10-20 ENCOUNTER — Encounter: Payer: Self-pay | Admitting: Family

## 2019-10-20 DIAGNOSIS — J029 Acute pharyngitis, unspecified: Secondary | ICD-10-CM

## 2019-10-20 HISTORY — DX: Acute pharyngitis, unspecified: J02.9

## 2019-10-26 ENCOUNTER — Telehealth: Payer: Self-pay | Admitting: Cardiology

## 2019-10-26 DIAGNOSIS — R002 Palpitations: Secondary | ICD-10-CM

## 2019-10-26 MED ORDER — ACEBUTOLOL HCL 200 MG PO CAPS: 200.0000 mg | ORAL_CAPSULE | Freq: Two times a day (BID) | ORAL | 2 refills | Status: DC

## 2019-10-26 NOTE — Telephone Encounter (Signed)
Spoke with patient after Dr. Bettina Gavia reviewed the kardia strips and explained Dr. Joya Gaskins recommendations (see MyChart message). Patient is agreeable to wearing another 7 day ZIO monitor for further evaluation of palpitations. Monitor has been registered and will be mailed to her home. Patient verbalized understanding. She will reinstall the Chad app on her phone and send Dr. Bettina Gavia several baseline Jodelle Red strips for review and comparison as a great deal of artifact was noted in the kardia strips since sent earlier today. Patient is agreeable to plan. She will continue taking acebutolol 200 mg twice daily. No further questions.

## 2019-10-26 NOTE — Telephone Encounter (Signed)
Called patient who reports a 40 minute episode of palpitations last night. She checked her heart rate and rhythm on the Chad app and noticed many PVCs while laying down. Patient got up to ambulate and symptoms resolved. When she checked her heart rate and rhythm on the Chad app again, she was back in normal sinus rhythm. Patient increased acebutolol 200 mg from as needed to twice daily per Laurann Montana, NP 2 days ago. Advised patient to send kardia strips through MyChart for Dr. Bettina Gavia to review per his request. Dr. Bettina Gavia will advise once kardia strips are reviewed. Will contact patient with further recommendations. Patient is agreeable and verbalized understanding. No further questions.

## 2019-10-26 NOTE — Telephone Encounter (Signed)
Patient is having multiple episodes of palpitations and heart arrythmias. Please call

## 2019-11-03 ENCOUNTER — Ambulatory Visit (INDEPENDENT_AMBULATORY_CARE_PROVIDER_SITE_OTHER): Payer: 59

## 2019-11-03 DIAGNOSIS — R002 Palpitations: Secondary | ICD-10-CM | POA: Diagnosis not present

## 2019-11-03 MED ORDER — ACEBUTOLOL HCL 200 MG PO CAPS: 200.0000 mg | ORAL_CAPSULE | Freq: Two times a day (BID) | ORAL | 1 refills | Status: DC

## 2019-11-04 ENCOUNTER — Other Ambulatory Visit: Payer: Self-pay | Admitting: *Deleted

## 2019-11-04 DIAGNOSIS — R002 Palpitations: Secondary | ICD-10-CM

## 2019-11-08 ENCOUNTER — Other Ambulatory Visit: Payer: Self-pay

## 2019-11-08 ENCOUNTER — Ambulatory Visit: Payer: 59 | Admitting: Cardiology

## 2019-11-08 ENCOUNTER — Encounter: Payer: Self-pay | Admitting: Cardiology

## 2019-11-08 VITALS — BP 146/80 | HR 77 | Ht 65.5 in | Wt 122.0 lb

## 2019-11-08 DIAGNOSIS — R002 Palpitations: Secondary | ICD-10-CM | POA: Diagnosis not present

## 2019-11-08 MED ORDER — DILTIAZEM HCL ER COATED BEADS 180 MG PO CP24: 180.0000 mg | ORAL_CAPSULE | Freq: Every day | ORAL | 1 refills | Status: DC

## 2019-11-08 NOTE — Progress Notes (Signed)
Electrophysiology Office Note   Date:  11/08/2019   ID:  Katherine Levine, Katherine Levine 11-11-1981, MRN [id number]  PCP:  Garlan Fillers, MD  Cardiologist:  Bettina Gavia Primary Electrophysiologist:   Meredith Leeds, MD    Chief Complaint: palpitations   History of Present Illness: Katherine Levine is a 38 y.o. female who is being seen today for the evaluation of palpitations at the request of Bettina Gavia, Hilton Cork, MD. Presenting today for electrophysiology evaluation.  She has a history of right peroneal nerve palsy, palpitations.  She has been having palpitations for quite some time.  She has been getting lightheaded and near syncopal.  She wore a ZIO monitor that showed infrequent PVCs and PACs.  She had some Mobitz 1 AV block, but no pauses of 3 seconds or more.  She had no atrial fibrillation, flutter, or SVT.  She continues to feel fluttering and pounding in her chest.  She also gets some shortness of breath.  This happens intermittently.  She has slowed down her exercise.  She is currently on acebutolol.  Today, she denies symptoms of palpitations, chest pain, shortness of breath, orthopnea, PND, lower extremity edema, claudication, dizziness, presyncope, syncope, bleeding, or neurologic sequela. The patient is tolerating medications without difficulties.    Past Medical History:  Diagnosis Date  . Dental crowns present    #8,9,25 per pt.  . History of kidney stones   . Left ACL tear 01/2017  . Vision abnormalities    Past Surgical History:  Procedure Laterality Date  . CESAREAN SECTION  2012; 2000   x 2  . CHOLECYSTECTOMY     age 63  . DILITATION & CURRETTAGE/HYSTROSCOPY WITH NOVASURE ABLATION  10/19/2012   Procedure: DILATATION & CURETTAGE/HYSTEROSCOPY WITH NOVASURE ABLATION;  Surgeon: Princess Bruins, MD;  Location: [facility];  Service: Gynecology;  Laterality: N/A;  . HEMORRHOID SURGERY    . KNEE ARTHROSCOPY WITH ANTERIOR CRUCIATE LIGAMENT (ACL) REPAIR Left 02/19/2017   Procedure: LEFT KNEE ARTHROSCOPY WITH ANTERIOR CRUCIATE LIGAMENT (ACL) REPAIR ALLOGRAFT;  Surgeon: Renette Butters, MD;  Location: [facility];  Service: Orthopedics;  Laterality: Left;  . LIVER RESECTION  06/2011  . TUBAL LIGATION       Current Outpatient Medications  Medication Sig Dispense Refill  . acebutolol (SECTRAL) 200 MG capsule Take 1 capsule (200 mg total) by mouth 2 (two) times daily. 180 capsule 1  . cetirizine (ZYRTEC) 10 MG chewable tablet Chew 10 mg by mouth as needed.     . Cholecalciferol (VITAMIN D-1000 MAX ST) 25 MCG (1000 UT) tablet Take 1 tablet by mouth daily.    . Cyanocobalamin (VITAMIN B-12) 2500 MCG SUBL Place 1 tablet under the tongue daily.    . fluticasone (FLONASE) 50 MCG/ACT nasal spray Place into both nostrils as needed.     Marland Kitchen GOODSENSE ALL DAY ALLERGY 10 MG tablet Take 1 tablet by mouth as needed for allergies.    Marland Kitchen LORazepam (ATIVAN) 1 MG tablet Take 1 mg by mouth as needed. Prior to dental appts    . omeprazole (PRILOSEC) 40 MG capsule Take 40 mg by mouth daily.    . phentermine 37.5 MG capsule TAKE 1 CAPSULE(37.5 MG) BY MOUTH EVERY MORNING 30 capsule 5  . rizatriptan (MAXALT-MLT) 10 MG disintegrating tablet TAKE 1 TABLET BY MOUTH AS NEEDED FOR MIGRAINE, MAY REPEAT IN 2 HOURS AS NEEDED 9 tablet 11  . diltiazem (CARDIZEM CD) 180 MG 24 hr capsule Take 1 capsule (180 mg total) by  mouth daily. 90 capsule 1   No current facility-administered medications for this visit.     Allergies:   Amoxicillin, Azithromycin, Fexofenadine, Sertraline, Adhesive [tape], Ciprofloxacin, Meperidine hcl, and Penicillins   Social History:  The patient  reports that she has never smoked. She has never used smokeless tobacco. She reports previous alcohol use. She reports that she does not use drugs.   Family History:  The patient's family history includes Cancer in her paternal aunt; Diabetes in her paternal aunt; Heart disease in her paternal aunt; Hypertension in  her paternal aunt; Melanoma in her father; Stroke in her paternal aunt. She was adopted.    ROS:  Please see the history of present illness.   Otherwise, review of systems is positive for none.   All other systems are reviewed and negative.    PHYSICAL EXAM: VS:  BP (!) 146/80   Pulse 77   Ht 5' 5.5" (1.664 m)   Wt 122 lb (55.3 kg)   SpO2 99%   BMI 19.99 kg/m  , BMI Body mass index is 19.99 kg/m. GEN: Well nourished, well developed, in no acute distress  HEENT: normal  Neck: no JVD, carotid bruits, or masses Cardiac: RRR; no murmurs, rubs, or gallops,no edema  Respiratory:  clear to auscultation bilaterally, normal work of breathing GI: soft, nontender, nondistended, + BS MS: no deformity or atrophy  Skin: warm and dry Neuro:  Strength and sensation are intact Psych: euthymic mood, full affect  EKG:  EKG is ordered today. Personal review of the ekg ordered shows sinus rhythm  Recent Labs: 08/12/2019: ALT 9; Hemoglobin 13.4; Platelets 210 09/02/2019: BUN 13; Creatinine, Ser 0.72; Potassium 3.9; Sodium 140    Lipid Panel     Component Value Date/Time   LDLDIRECT 73 12/17/2010 2053     Wt Readings from Last 3 Encounters:  11/08/19 122 lb (55.3 kg)  10/12/19 124 lb (56.2 kg)  09/02/19 131 lb 6.4 oz (59.6 kg)      Other studies Reviewed: Additional studies/ records that were reviewed today include: TTE 2017  Review of the above records today demonstrates:  - Left ventricle: The cavity size was normal. Wall thickness was   normal. Systolic function was normal. The estimated ejection   fraction was in the range of 60% to 65%. Wall motion was normal;   there were no regional wall motion abnormalities.  Cardiac Monitor 10/07/19 personally reviewed Conclusion, rare ventricular and supraventricular ectopy.  The 2 triggered and diary events predominantly unassociated with arrhythmia.  Rare Mobitz 1 second-degree AV block without pauses.  ASSESSMENT AND PLAN:  1.   Palpitations: Could certainly be due to an atrial tachycardia as she does have fairly abrupt changes in her heart rate.  She is on acebutolol which has somewhat improved her symptoms.  Despite that, she is continued to have symptoms.  Due to that, we  start her on diltiazem 180 mg.  She says that this started in August.  It could be that she has something acute going on that could get better over time.    Current medicines are reviewed at length with the patient today.   The patient does not have concerns regarding her medicines.  The following changes were made today: Start diltiazem  Labs/ tests ordered today include:  Orders Placed This Encounter  Procedures  . EKG 12-Lead     Disposition:   FU with   6 months  Signed,  Meredith Leeds, MD  11/08/2019 5:02 PM  [place] [place] [address] [phone] (office) [phone] (fax)

## 2019-11-08 NOTE — Patient Instructions (Addendum)
Medication Instructions:  Your physician has recommended you make the following change in your medication:  1. START Diltiazem 180 mg once daily  * If you need a refill on your cardiac medications before your next appointment, please call your pharmacy.   Labwork: None ordered  Testing/Procedures: None ordered  Follow-Up: Your physician wants you to follow-up in: 6 months with Dr. Curt Bears in [facility].  You will receive a reminder letter in the mail two months in advance. If you don't receive a letter, please call our office to schedule the follow-up appointment.   Thank you for choosing [facility]!!   Trinidad Curet, RN [phone]  Any Other Special Instructions Will Be Listed Below (If Applicable).

## 2019-11-17 ENCOUNTER — Institutional Professional Consult (permissible substitution): Payer: 59 | Admitting: Cardiology

## 2019-11-17 ENCOUNTER — Encounter

## 2020-01-11 DIAGNOSIS — D1802 Hemangioma of intracranial structures: Secondary | ICD-10-CM

## 2020-01-11 HISTORY — DX: Hemangioma of intracranial structures: D18.02

## 2020-05-09 ENCOUNTER — Telehealth: Payer: Self-pay

## 2020-05-09 MED ORDER — ACEBUTOLOL HCL 200 MG PO CAPS: 200.0000 mg | ORAL_CAPSULE | Freq: Two times a day (BID) | ORAL | 1 refills | Status: DC

## 2020-05-09 NOTE — Telephone Encounter (Signed)
Rx refill sent for Acebutolol

## 2020-06-09 NOTE — Progress Notes (Signed)
Cardiology Office Note:    Date:  06/11/2020   ID:  Katherine Levine, DOB 1981/07/17, MRN [id number]  PCP:  Garlan Fillers, MD  Cardiologist:  Shirlee More, MD    Referring MD: Garlan Fillers, MD    ASSESSMENT:    1. Palpitations   2. PVC (premature ventricular contraction)   3. PAC (premature atrial contraction)   4. Hypokalemia    PLAN:    In order of problems listed above:  1. She will discontinue her beta-blocker its been effective contributing to hypotension symptomatic and malaise and continue Cardizem going back to her previous dose of 180 mg daily. 2. Potassium 20 mEq daily 30 days refill 3 I will ask her PCP check magnesium with her next renal profile   Next appointment: 6 months   Medication Adjustments/Labs and Tests Ordered: Current medicines are reviewed at length with the patient today.  Concerns regarding medicines are outlined above.  No orders of the defined types were placed in this encounter.  Meds ordered this encounter  Medications  . diltiazem (CARDIZEM CD) 180 MG 24 hr capsule    Sig: Take 1 capsule (180 mg total) by mouth daily.    Dispense:  180 capsule    Refill:  1  . potassium chloride SA (KLOR-CON) 20 MEQ tablet    Sig: Take 1 tablet (20 mEq total) by mouth daily.    Dispense:  30 tablet    Refill:  2    No chief complaint on file.   History of Present Illness:    Katherine Levine is a 39 y.o. female with a hx of low oxygen saturation and palpitation last seen 09/02/2019.  At that time her D-dimer was low 0.26 she underwent CTA of the chest which showed no evidence of pulmonary embolism and was normal.  Ambulatory event monitor showed rare ventricular and supraventricular arrhythmia.  A subsequent repeat monitor showed isolated ventricular and supraventricular ectopy with triggered events associated with PVCs and APCs and she was treated with Cardizem.  [facility] 06/01/2020: BMP normal except for potassium 3.3 Hemoglobin normal  13.1 Iron and ferritin normal Normal 1.04  Compliance with diet, lifestyle and medications: Yes  With combined hospital diltiazem she has had hypotension and weakness.  Beta-blocker is ineffective we can increase it back to her previous dose of Cardizem which very nicely eliminated PVCs.  She has hypokalemia I will start her on supplementation Next labs I would also check a magnesium level.  This can be proarrhythmic.  No chest pain shortness of breath edema or syncope Past Medical History:  Diagnosis Date  . Dental crowns present    #8,9,25 per pt.  . History of kidney stones   . Left ACL tear 01/2017  . Vision abnormalities     Past Surgical History:  Procedure Laterality Date  . CESAREAN SECTION  2012; 2000   x 2  . CHOLECYSTECTOMY     age 39  . DILITATION & CURRETTAGE/HYSTROSCOPY WITH NOVASURE ABLATION  10/19/2012   Procedure: DILATATION & CURETTAGE/HYSTEROSCOPY WITH NOVASURE ABLATION;  Surgeon: Princess Bruins, MD;  Location: [facility];  Service: Gynecology;  Laterality: N/A;  . HEMORRHOID SURGERY    . KNEE ARTHROSCOPY WITH ANTERIOR CRUCIATE LIGAMENT (ACL) REPAIR Left 02/19/2017   Procedure: LEFT KNEE ARTHROSCOPY WITH ANTERIOR CRUCIATE LIGAMENT (ACL) REPAIR ALLOGRAFT;  Surgeon: Renette Butters, MD;  Location: [place];  Service: Orthopedics;  Laterality: Left;  . LIVER RESECTION  06/2011  . TUBAL LIGATION  Current Medications: Current Meds  Medication Sig  . cetirizine (ZYRTEC) 10 MG chewable tablet Chew 10 mg by mouth as needed.   . Cholecalciferol (VITAMIN D-1000 MAX ST) 25 MCG (1000 UT) tablet Take 1 tablet by mouth daily.  . Cyanocobalamin (VITAMIN B-12) 2500 MCG SUBL Place 1 tablet under the tongue daily.  Marland Kitchen diltiazem (TIAZAC) 120 MG 24 hr capsule Take 120 mg by mouth daily.  . fluticasone (FLONASE) 50 MCG/ACT nasal spray Place into both nostrils as needed.   Marland Kitchen GOODSENSE ALL DAY ALLERGY 10 MG tablet Take 1 tablet by mouth as needed for allergies.    Marland Kitchen LORazepam (ATIVAN) 1 MG tablet Take 1 mg by mouth as needed. Prior to dental appts  . omeprazole (PRILOSEC) 40 MG capsule Take 40 mg by mouth daily.  . rizatriptan (MAXALT-MLT) 10 MG disintegrating tablet TAKE 1 TABLET BY MOUTH AS NEEDED FOR MIGRAINE, MAY REPEAT IN 2 HOURS AS NEEDED  . [DISCONTINUED] acebutolol (SECTRAL) 200 MG capsule Take 1 capsule (200 mg total) by mouth 2 (two) times daily.     Allergies:   Amoxicillin, Azithromycin, Fexofenadine, Sertraline, Adhesive [tape], Ciprofloxacin, Meperidine hcl, and Penicillins   Social History   Socioeconomic History  . Marital status: Married    Spouse name: Not on file  . Number of children: Not on file  . Years of education: Not on file  . Highest education level: Not on file  Occupational History  . Not on file  Tobacco Use  . Smoking status: Never Smoker  . Smokeless tobacco: Never Used  Vaping Use  . Vaping Use: Never used  Substance and Sexual Activity  . Alcohol use: Not Currently    Alcohol/week: 0.0 standard drinks  . Drug use: No  . Sexual activity: Yes    Birth control/protection: Surgical  Other Topics Concern  . Not on file  Social History Narrative  . Not on file   Social Determinants of Health   Financial Resource Strain:   . Difficulty of Paying Living Expenses:   Food Insecurity:   . Worried About Charity fundraiser in the Last Year:   . Arboriculturist in the Last Year:   Transportation Needs:   . Film/video editor (Medical):   Marland Kitchen Lack of Transportation (Non-Medical):   Physical Activity:   . Days of Exercise per Week:   . Minutes of Exercise per Session:   Stress:   . Feeling of Stress :   Social Connections:   . Frequency of Communication with Friends and Family:   . Frequency of Social Gatherings with Friends and Family:   . Attends Religious Services:   . Active Member of Clubs or Organizations:   . Attends Archivist Meetings:   Marland Kitchen Marital Status:      Family  History: The patient's family history includes Cancer in her paternal aunt; Diabetes in her paternal aunt; Heart disease in her paternal aunt; Hypertension in her paternal aunt; Melanoma in her father; Stroke in her paternal aunt. She was adopted. ROS:   Please see the history of present illness.    All other systems reviewed and are negative.  EKGs/Labs/Other Studies Reviewed:    The following studies were reviewed today:    Recent Labs: 08/12/2019: ALT 9; Hemoglobin 13.4; Platelets 210 09/02/2019: BUN 13; Creatinine, Ser 0.72; Potassium 3.9; Sodium 140  Recent Lipid Panel    Component Value Date/Time   LDLDIRECT 73 12/17/2010 2053    Physical Exam:  VS:  BP 106/74   Pulse 77   Ht 5' 5.5" (1.664 m)   Wt 124 lb 1.3 oz (56.3 kg)   SpO2 100%   BMI 20.33 kg/m     Wt Readings from Last 3 Encounters:  06/11/20 124 lb 1.3 oz (56.3 kg)  11/08/19 122 lb (55.3 kg)  10/12/19 124 lb (56.2 kg)     GEN:  Well nourished, well developed in no acute distress HEENT: Normal NECK: No JVD; No carotid bruits LYMPHATICS: No lymphadenopathy CARDIAC: RRR, no murmurs, rubs, gallops RESPIRATORY:  Clear to auscultation without rales, wheezing or rhonchi  ABDOMEN: Soft, non-tender, non-distended MUSCULOSKELETAL:  No edema; No deformity  SKIN: Warm and dry NEUROLOGIC:  Alert and oriented x 3 PSYCHIATRIC:  Normal affect    Signed, Shirlee More, MD  06/11/2020 4:55 PM    [facility]

## 2020-06-11 ENCOUNTER — Other Ambulatory Visit: Payer: Self-pay

## 2020-06-11 ENCOUNTER — Ambulatory Visit (INDEPENDENT_AMBULATORY_CARE_PROVIDER_SITE_OTHER): Payer: BC Managed Care – PPO | Admitting: Cardiology

## 2020-06-11 ENCOUNTER — Encounter: Payer: Self-pay | Admitting: Cardiology

## 2020-06-11 VITALS — BP 106/74 | HR 77 | Ht 65.5 in | Wt 124.1 lb

## 2020-06-11 DIAGNOSIS — I493 Ventricular premature depolarization: Secondary | ICD-10-CM | POA: Diagnosis not present

## 2020-06-11 DIAGNOSIS — I491 Atrial premature depolarization: Secondary | ICD-10-CM

## 2020-06-11 DIAGNOSIS — R002 Palpitations: Secondary | ICD-10-CM

## 2020-06-11 DIAGNOSIS — E876 Hypokalemia: Secondary | ICD-10-CM | POA: Diagnosis not present

## 2020-06-11 MED ORDER — DILTIAZEM HCL ER COATED BEADS 180 MG PO CP24: 180.0000 mg | ORAL_CAPSULE | Freq: Every day | ORAL | 1 refills | Status: DC

## 2020-06-11 MED ORDER — POTASSIUM CHLORIDE CRYS ER 20 MEQ PO TBCR: 20.0000 meq | EXTENDED_RELEASE_TABLET | Freq: Every day | ORAL | 2 refills | Status: DC

## 2020-06-11 NOTE — Patient Instructions (Addendum)
Medication Instructions:  Your physician has recommended you make the following change in your medication:  INCREASE: Cardizem 180 take one tablet by mouth daily.  STOP: Acebutolol START: Potassium Chloride 20 meq take one tablet by mouth daily.  *If you need a refill on your cardiac medications before your next appointment, please call your pharmacy*   Lab Work: None If you have labs (blood work) drawn today and your tests are completely normal, you will receive your results only by: Marland Kitchen MyChart Message (if you have MyChart) OR . A paper copy in the mail If you have any lab test that is abnormal or we need to change your treatment, we will call you to review the results.   Testing/Procedures: None   Follow-Up: At [facility], you and your health needs are our priority.  As part of our continuing mission to provide you with exceptional heart care, we have created designated Provider Care Teams.  These Care Teams include your primary Cardiologist (physician) and Advanced Practice Providers (APPs -  Physician Assistants and Nurse Practitioners) who all work together to provide you with the care you need, when you need it.  We recommend signing up for the patient portal called "MyChart".  Sign up information is provided on this After Visit Summary.  MyChart is used to connect with patients for Virtual Visits (Telemedicine).  Patients are able to view lab/test results, encounter notes, upcoming appointments, etc.  Non-urgent messages can be sent to your provider as well.   To learn more about what you can do with MyChart, go to [email or web address].    Your next appointment:   1 year(s)  The format for your next appointment:   In Person  Provider:   Shirlee More, MD   Other Instructions

## 2020-08-21 ENCOUNTER — Other Ambulatory Visit: Payer: Self-pay | Admitting: Cardiology

## 2020-11-09 DIAGNOSIS — Z87442 Personal history of urinary calculi: Secondary | ICD-10-CM | POA: Insufficient documentation

## 2020-11-09 DIAGNOSIS — H539 Unspecified visual disturbance: Secondary | ICD-10-CM | POA: Insufficient documentation

## 2020-11-09 DIAGNOSIS — Z98811 Dental restoration status: Secondary | ICD-10-CM | POA: Insufficient documentation

## 2020-12-05 ENCOUNTER — Ambulatory Visit: Payer: BC Managed Care – PPO | Admitting: Cardiology

## 2020-12-05 NOTE — Progress Notes (Deleted)
Cardiology Office Note:    Date:  12/05/2020   ID:  Katherine Levine, DOB 08-25-1981, MRN [id number]  PCP:  Garlan Fillers, MD  Cardiologist:  Shirlee More, MD    Referring MD: Garlan Fillers, MD    ASSESSMENT:    No diagnosis found. PLAN:    In order of problems listed above:  1. ***   Next appointment: ***   Medication Adjustments/Labs and Tests Ordered: Current medicines are reviewed at length with the patient today.  Concerns regarding medicines are outlined above.  No orders of the defined types were placed in this encounter.  No orders of the defined types were placed in this encounter.   No chief complaint on file.   History of Present Illness:    Katherine Levine is a 40 y.o. female with a hx of palpitation hypokalemia and symptomatic PVCs and APCs although the overall frequency and event monitor was rare last seen 06/11/2020. Compliance with diet, lifestyle and medications: ***  Recent labs Katherine Levine 11/27/2020 sodium 138 potassium 3.9 creatinine 0.78 GFR greater than 90 cc cholesterol 145 LDL at target 60 triglycerides 48 HDL cholesterol 75 TSH normal 1.25 and CBC showed a hemoglobin of 13.7. Past Medical History:  Diagnosis Date  . Dental crowns present    #8,9,25 per pt.  . History of kidney stones   . Left ACL tear 01/2017  . Vision abnormalities     Past Surgical History:  Procedure Laterality Date  . CESAREAN SECTION  2012; 2000   x 2  . CHOLECYSTECTOMY     age 89  . DILITATION & CURRETTAGE/HYSTROSCOPY WITH NOVASURE ABLATION  10/19/2012   Procedure: DILATATION & CURETTAGE/HYSTEROSCOPY WITH NOVASURE ABLATION;  Surgeon: Princess Bruins, MD;  Location: [facility];  Service: Gynecology;  Laterality: N/A;  . HEMORRHOID SURGERY    . KNEE ARTHROSCOPY WITH ANTERIOR CRUCIATE LIGAMENT (ACL) REPAIR Left 02/19/2017   Procedure: LEFT KNEE ARTHROSCOPY WITH ANTERIOR CRUCIATE LIGAMENT (ACL) REPAIR ALLOGRAFT;  Surgeon: Renette Butters, MD;   Location: [place];  Service: Orthopedics;  Laterality: Left;  . LIVER RESECTION  06/2011  . TUBAL LIGATION      Current Medications: No outpatient medications have been marked as taking for the 12/05/20 encounter (Appointment) with Richardo Priest, MD.     Allergies:   Amoxicillin, Azithromycin, Fexofenadine, Sertraline, Adhesive [tape], Ciprofloxacin, Meperidine hcl, and Penicillins   Social History   Socioeconomic History  . Marital status: Married    Spouse name: Not on file  . Number of children: Not on file  . Years of education: Not on file  . Highest education level: Not on file  Occupational History  . Not on file  Tobacco Use  . Smoking status: Never Smoker  . Smokeless tobacco: Never Used  Vaping Use  . Vaping Use: Never used  Substance and Sexual Activity  . Alcohol use: Not Currently    Alcohol/week: 0.0 standard drinks  . Drug use: No  . Sexual activity: Yes    Birth control/protection: Surgical  Other Topics Concern  . Not on file  Social History Narrative  . Not on file   Social Determinants of Health   Financial Resource Strain: Not on file  Food Insecurity: Not on file  Transportation Needs: Not on file  Physical Activity: Not on file  Stress: Not on file  Social Connections: Not on file     Family History: The patient's ***family history includes Cancer in her paternal  aunt; Diabetes in her paternal aunt; Heart disease in her paternal aunt; Hypertension in her paternal aunt; Melanoma in her father; Stroke in her paternal aunt. She was adopted. ROS:   Please see the history of present illness.    All other systems reviewed and are negative.  EKGs/Labs/Other Studies Reviewed:    The following studies were reviewed today:  EKG:  EKG ordered today and personally reviewed.  The ekg ordered today demonstrates ***  Recent Labs: No results found for requested labs within last 8760 hours.  Recent Lipid Panel    Component Value  Date/Time   LDLDIRECT 73 12/17/2010 2053    Physical Exam:    VS:  There were no vitals taken for this visit.    Wt Readings from Last 3 Encounters:  06/11/20 124 lb 1.3 oz (56.3 kg)  11/08/19 122 lb (55.3 kg)  10/12/19 124 lb (56.2 kg)     GEN: *** Well nourished, well developed in no acute distress HEENT: Normal NECK: No JVD; No carotid bruits LYMPHATICS: No lymphadenopathy CARDIAC: ***RRR, no murmurs, rubs, gallops RESPIRATORY:  Clear to auscultation without rales, wheezing or rhonchi  ABDOMEN: Soft, non-tender, non-distended MUSCULOSKELETAL:  No edema; No deformity  SKIN: Warm and dry NEUROLOGIC:  Alert and oriented x 3 PSYCHIATRIC:  Normal affect    Signed, Norman Herrlich, MD  12/05/2020 7:35 AM    [facility]

## 2020-12-07 MED ORDER — DILTIAZEM HCL ER COATED BEADS 120 MG PO CP24: 120.0000 mg | ORAL_CAPSULE | Freq: Every day | ORAL | 3 refills | Status: DC

## 2021-01-02 ENCOUNTER — Encounter: Payer: Self-pay | Admitting: Cardiology

## 2021-01-02 ENCOUNTER — Other Ambulatory Visit: Payer: Self-pay

## 2021-01-02 ENCOUNTER — Ambulatory Visit: Payer: BC Managed Care – PPO | Admitting: Cardiology

## 2021-01-02 VITALS — BP 120/60 | HR 70 | Ht 65.5 in | Wt 127.1 lb

## 2021-01-02 DIAGNOSIS — R002 Palpitations: Secondary | ICD-10-CM

## 2021-01-02 DIAGNOSIS — E876 Hypokalemia: Secondary | ICD-10-CM | POA: Diagnosis not present

## 2021-01-02 DIAGNOSIS — I493 Ventricular premature depolarization: Secondary | ICD-10-CM

## 2021-01-02 DIAGNOSIS — I491 Atrial premature depolarization: Secondary | ICD-10-CM

## 2021-01-02 MED ORDER — DILTIAZEM HCL ER COATED BEADS 120 MG PO CP24: 120.0000 mg | ORAL_CAPSULE | Freq: Every day | ORAL | 3 refills | Status: DC

## 2021-01-02 MED ORDER — POTASSIUM CHLORIDE CRYS ER 20 MEQ PO TBCR: EXTENDED_RELEASE_TABLET | ORAL | 3 refills | Status: DC

## 2021-01-02 NOTE — Patient Instructions (Signed)

## 2021-01-02 NOTE — Progress Notes (Signed)
Cardiology Office Note:    Date:  01/02/2021   ID:  Katherine Levine, DOB 04/10/1981, MRN [id number]  PCP:  Garlan Fillers, MD  Cardiologist:  Shirlee More, MD    Referring MD: Garlan Fillers, MD    ASSESSMENT:    1. Palpitations   2. PVC (premature ventricular contraction)   3. PAC (premature atrial contraction)   4. Hypokalemia    PLAN:    In order of problems listed above:  1. She has done well with her symptomatic PVCs APCs continue low-dose calcium channel blocker I told her she like she can reduce to every other day and I will keep her on long-term potassium supplements with previous hypokalemia precipitating arrhythmia.   Next appointment: 1 year   Medication Adjustments/Labs and Tests Ordered: Current medicines are reviewed at length with the patient today.  Concerns regarding medicines are outlined above.  Orders Placed This Encounter  Procedures  . EKG 12-Lead   No orders of the defined types were placed in this encounter.   Chief Complaint  Patient presents with  . Follow-up  . Palpitations    History of Present Illness:     Katherine Levine is a 40 y.o. female with a hx of palpitation hypokalemia and symptomatic PVCs and APCs although the overall frequency and event monitor was rare last seen 06/11/2020.  Compliance with diet, lifestyle and medications: Yes  1 month ago she had COVID-19 outpatient and vaccinated individual.  She had a great deal of weakness and fatigue has resolved. She continues to do well no palpitation tolerates diltiazem is on potassium supplements tells me in October her serum potassium was normal. Past Medical History:  Diagnosis Date  . Dental crowns present    #8,9,25 per pt.  . History of kidney stones   . Left ACL tear 01/2017  . Vision abnormalities     Past Surgical History:  Procedure Laterality Date  . CESAREAN SECTION  2012; 2000   x 2  . CHOLECYSTECTOMY     age 24  . DILITATION &  CURRETTAGE/HYSTROSCOPY WITH NOVASURE ABLATION  10/19/2012   Procedure: DILATATION & CURETTAGE/HYSTEROSCOPY WITH NOVASURE ABLATION;  Surgeon: Princess Bruins, MD;  Location: [facility];  Service: Gynecology;  Laterality: N/A;  . HEMORRHOID SURGERY    . KNEE ARTHROSCOPY WITH ANTERIOR CRUCIATE LIGAMENT (ACL) REPAIR Left 02/19/2017   Procedure: LEFT KNEE ARTHROSCOPY WITH ANTERIOR CRUCIATE LIGAMENT (ACL) REPAIR ALLOGRAFT;  Surgeon: Renette Butters, MD;  Location: [place];  Service: Orthopedics;  Laterality: Left;  . LIVER RESECTION  06/2011  . TUBAL LIGATION      Current Medications: Current Meds  Medication Sig  . cetirizine (ZYRTEC) 10 MG chewable tablet Chew 10 mg by mouth as needed.   . Cholecalciferol (VITAMIN D-1000 MAX ST) 25 MCG (1000 UT) tablet Take 1 tablet by mouth daily.  . Cyanocobalamin (VITAMIN B-12) 2500 MCG SUBL Place 1 tablet under the tongue daily.  Marland Kitchen diltiazem (CARDIZEM CD) 120 MG 24 hr capsule Take 1 capsule (120 mg total) by mouth daily.  Marland Kitchen diltiazem (TIAZAC) 120 MG 24 hr capsule Take 120 mg by mouth daily.  . fluticasone (FLONASE) 50 MCG/ACT nasal spray Place into both nostrils as needed.   Marland Kitchen GOODSENSE ALL DAY ALLERGY 10 MG tablet Take 1 tablet by mouth as needed for allergies.  Marland Kitchen LORazepam (ATIVAN) 1 MG tablet Take 1 mg by mouth as needed. Prior to dental appts  . omeprazole (PRILOSEC) 40 MG capsule Take  40 mg by mouth daily.  . potassium chloride SA (KLOR-CON) 20 MEQ tablet TAKE 1 TABLET(20 MEQ) BY MOUTH DAILY  . rizatriptan (MAXALT-MLT) 10 MG disintegrating tablet TAKE 1 TABLET BY MOUTH AS NEEDED FOR MIGRAINE, MAY REPEAT IN 2 HOURS AS NEEDED     Allergies:   Amoxicillin, Azithromycin, Fexofenadine, Sertraline, Adhesive [tape], Ciprofloxacin, Meperidine hcl, and Penicillins   Social History   Socioeconomic History  . Marital status: Married    Spouse name: Not on file  . Number of children: Not on file  . Years of education: Not on file  . Highest  education level: Not on file  Occupational History  . Not on file  Tobacco Use  . Smoking status: Never Smoker  . Smokeless tobacco: Never Used  Vaping Use  . Vaping Use: Never used  Substance and Sexual Activity  . Alcohol use: Not Currently    Alcohol/week: 0.0 standard drinks  . Drug use: No  . Sexual activity: Yes    Birth control/protection: Surgical  Other Topics Concern  . Not on file  Social History Narrative  . Not on file   Social Determinants of Health   Financial Resource Strain: Not on file  Food Insecurity: Not on file  Transportation Needs: Not on file  Physical Activity: Not on file  Stress: Not on file  Social Connections: Not on file     Family History: The patient's family history includes Cancer in her paternal aunt; Diabetes in her paternal aunt; Heart disease in her paternal aunt; Hypertension in her paternal aunt; Melanoma in her father; Stroke in her paternal aunt. She was adopted. ROS:   Please see the history of present illness.    All other systems reviewed and are negative.  EKGs/Labs/Other Studies Reviewed:    The following studies were reviewed today:  EKG:  EKG ordered today and personally reviewed.  The ekg ordered today demonstrates sinus rhythm and normal including QT interval and QRS morphology  Recent Labs: No results found for requested labs within last 8760 hours.  Recent Lipid Panel    Component Value Date/Time   LDLDIRECT 73 12/17/2010 2053    Physical Exam:    VS:  BP 120/60 (BP Location: Right Arm, Patient Position: Sitting, Cuff Size: Normal)   Pulse 70   Ht 5' 5.5" (1.664 m)   Wt 127 lb 1.9 oz (57.7 kg)   SpO2 99%   BMI 20.83 kg/m     Wt Readings from Last 3 Encounters:  01/02/21 127 lb 1.9 oz (57.7 kg)  06/11/20 124 lb 1.3 oz (56.3 kg)  11/08/19 122 lb (55.3 kg)     GEN:  Well nourished, well developed in no acute distress HEENT: Normal NECK: No JVD; No carotid bruits LYMPHATICS: No  lymphadenopathy CARDIAC: RRR, no murmurs, rubs, gallops RESPIRATORY:  Clear to auscultation without rales, wheezing or rhonchi  ABDOMEN: Soft, non-tender, non-distended MUSCULOSKELETAL:  No edema; No deformity  SKIN: Warm and dry NEUROLOGIC:  Alert and oriented x 3 PSYCHIATRIC:  Normal affect    Signed, Shirlee More, MD  01/02/2021 3:44 PM    [facility]

## 2021-02-17 IMAGING — CT CT ANGIO CHEST
2 of 8 series · 19 of 36 positions shown · IV contrast (omnipaque)
Comparison: None.

CLINICAL DATA: Shortness of breath times 2-3 months.

EXAM:
CT ANGIOGRAPHY CHEST WITH CONTRAST
TECHNIQUE: Multidetector CT imaging of the chest was performed using the
standard protocol during bolus administration of intravenous
contrast. Multiplanar CT image reconstructions and MIPs were
obtained to evaluate the vascular anatomy.
CONTRAST:  100mL OMNIPAQUE IOHEXOL 350 MG/ML SOLN

[Series 6: pe coronal mpr · coronal · 0.56mm/px · 1 of 112 slices shown]
[im 56/112  mediastinal]
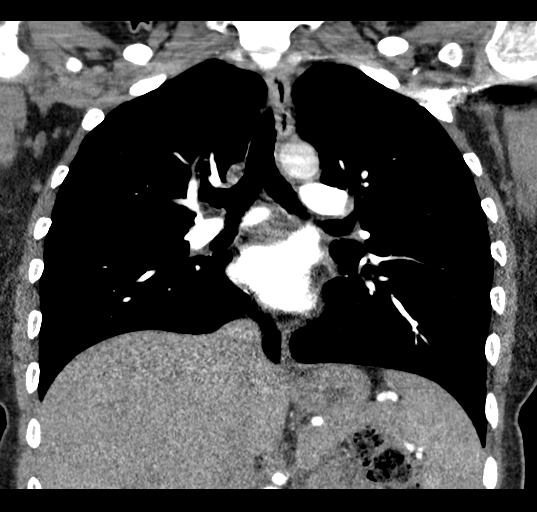

[Series 10: pe thins · axial · 0.62mm/px · z∈[-260,-24]mm · 18 of 264 slices shown]
[im 14/264  lung]
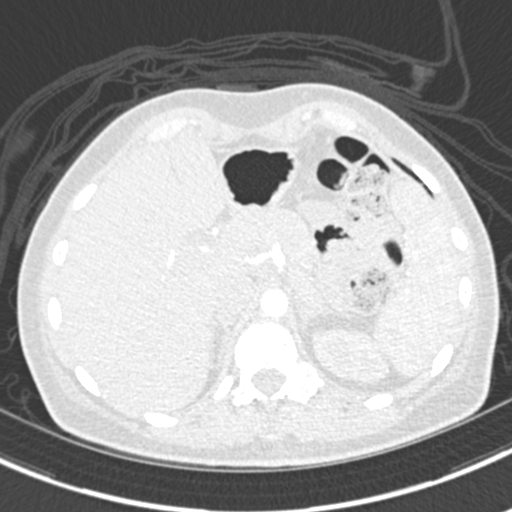
[im 28/264  mediastinal]
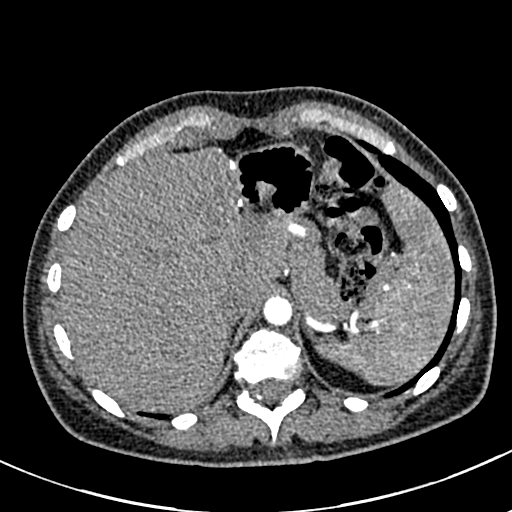
[im 42/264  lung]
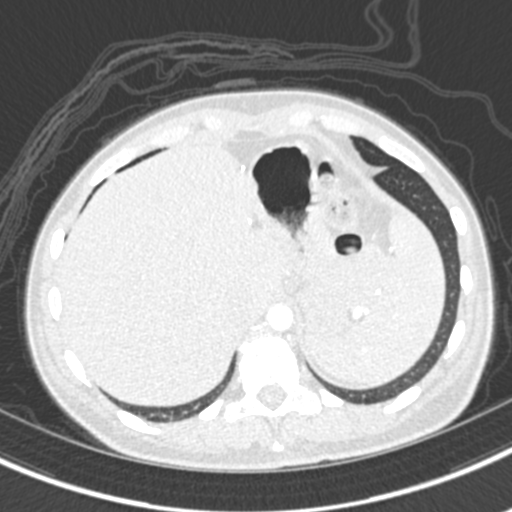
[im 56/264  mediastinal]
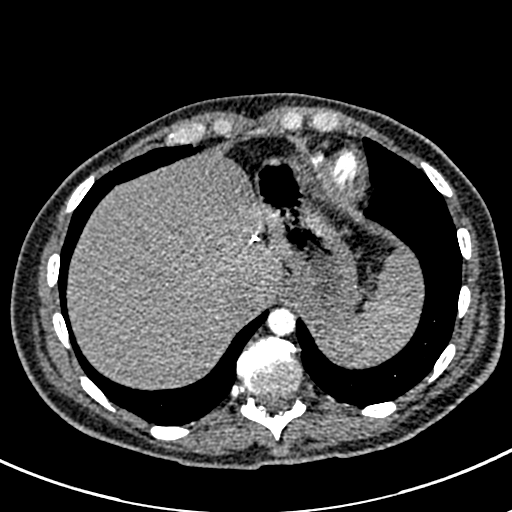
[im 70/264  lung]
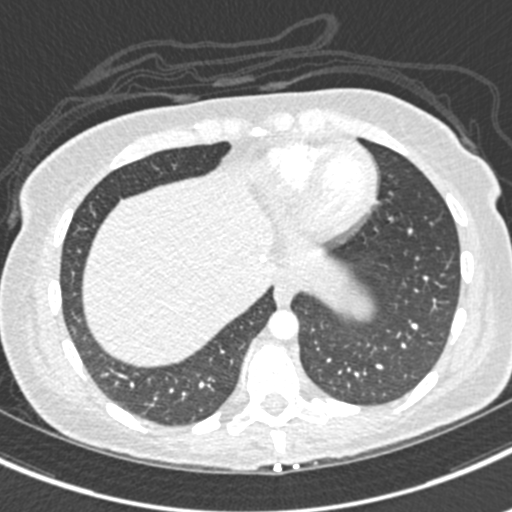
[im 84/264  mediastinal]
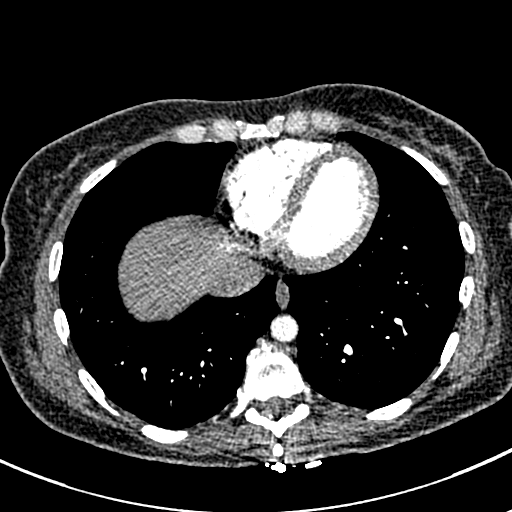
[im 97/264  lung]
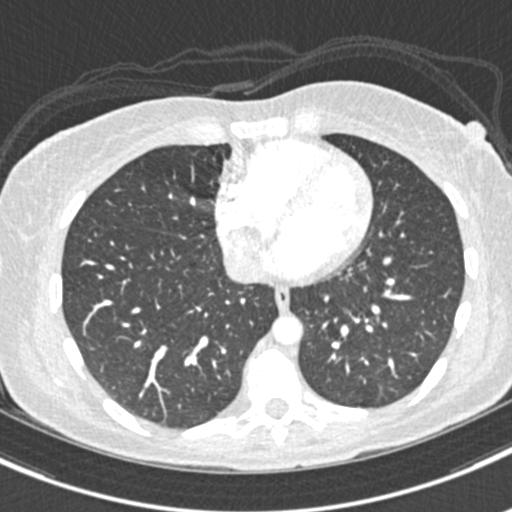
[im 111/264  mediastinal]
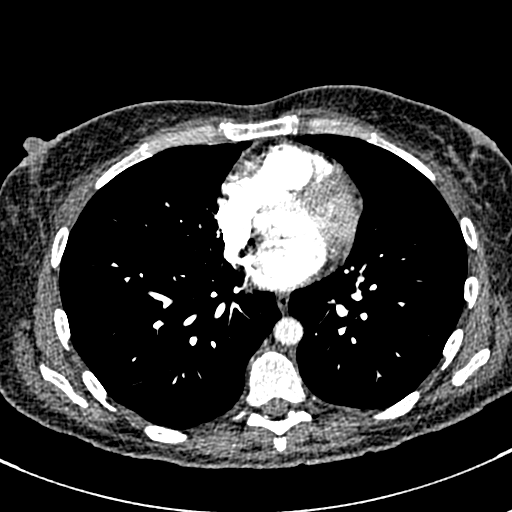
[im 125/264  lung]
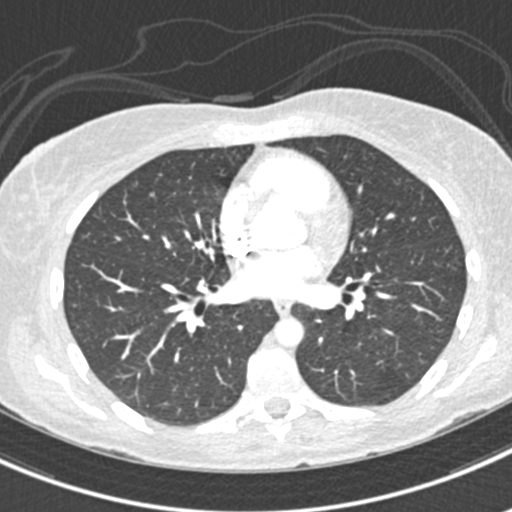
[im 139/264  mediastinal]
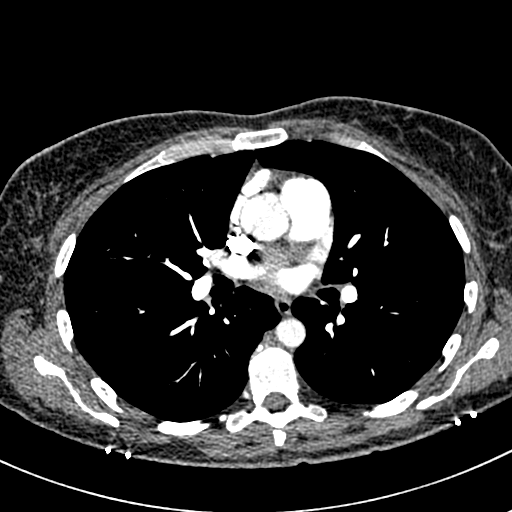
[im 153/264  lung]
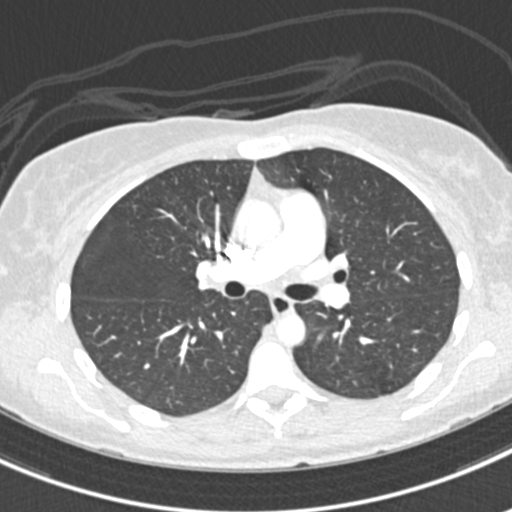
[im 167/264  mediastinal]
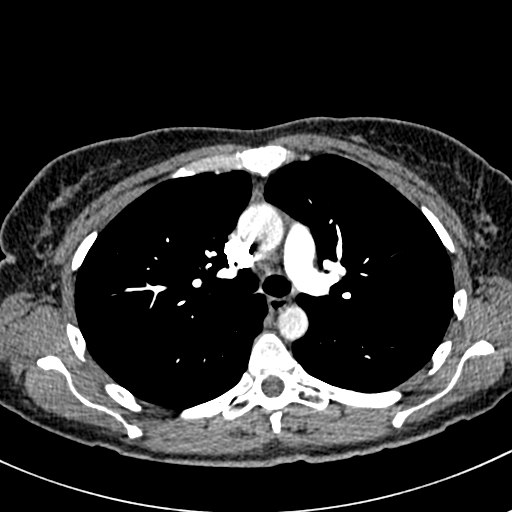
[im 180/264  lung]
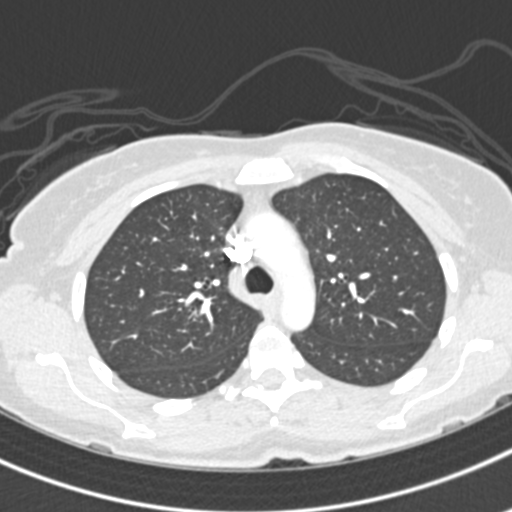
[im 194/264  mediastinal]
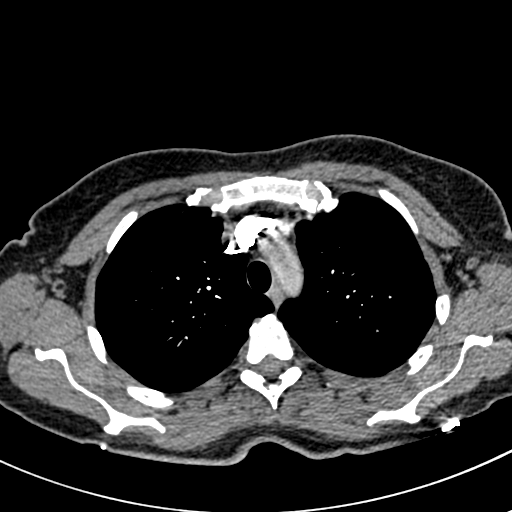
[im 208/264  lung]
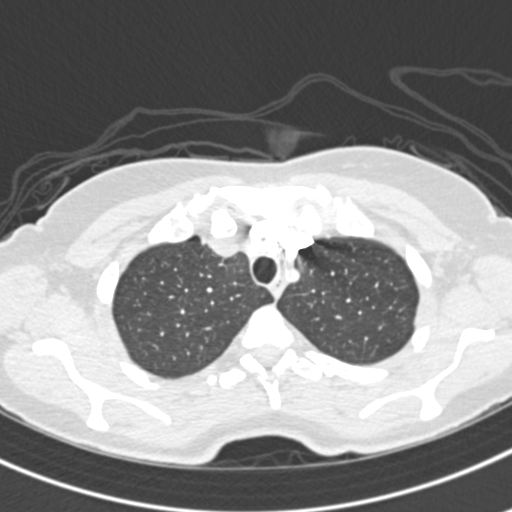
[im 222/264  mediastinal]
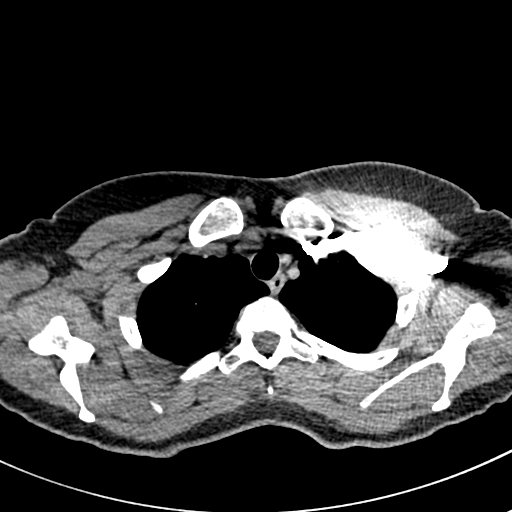
[im 236/264  lung]
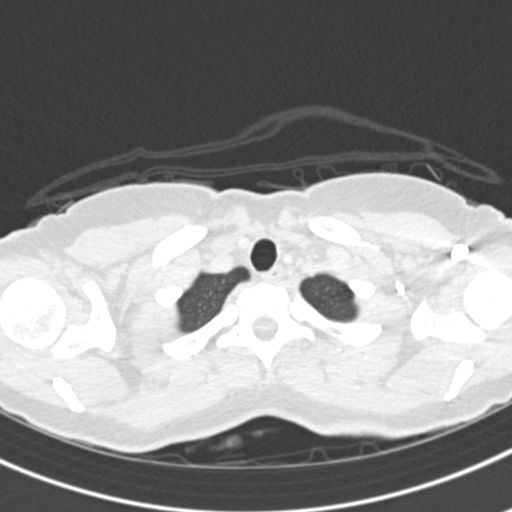
[im 250/264  mediastinal]
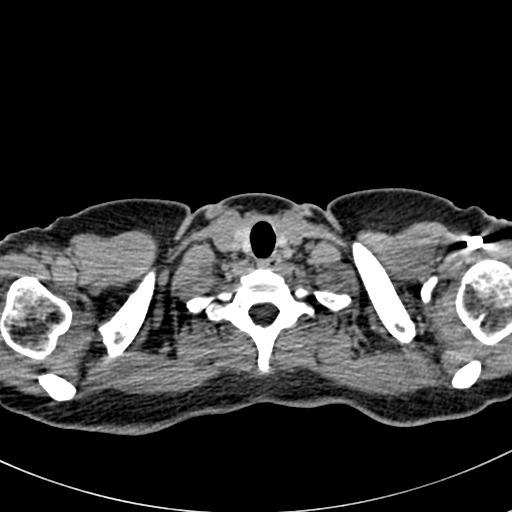

[19 of 36 positions shown; findings below may reference images not displayed]

FINDINGS: Cardiovascular: Contrast injection is sufficient to demonstrate
satisfactory opacification of the pulmonary arteries to the
segmental level. There is no pulmonary embolus. The main pulmonary
artery is within normal limits for size. There is no CT evidence of
acute right heart strain. The visualized aorta is normal. Heart size
is normal, without pericardial effusion.

Mediastinum/Nodes:

--No mediastinal or hilar lymphadenopathy.

--No axillary lymphadenopathy.

--No supraclavicular lymphadenopathy.

--Normal thyroid gland.

--The esophagus is unremarkable

Lungs/Pleura: No pulmonary nodules or masses. No pleural effusion or
pneumothorax. No focal airspace consolidation. No focal pleural
abnormality.

Upper Abdomen: Patient appears to be status post prior left
hepatectomy

Musculoskeletal: No chest wall abnormality. No acute or significant
osseous findings.

Review of the MIP images confirms the above findings.
IMPRESSION: 1. No pulmonary embolism.
2. The lungs are clear. No specific finding to explain the patient's
shortness of breath.

## 2021-03-18 ENCOUNTER — Ambulatory Visit: Payer: BC Managed Care – PPO | Admitting: Cardiology

## 2021-03-18 ENCOUNTER — Encounter: Payer: Self-pay | Admitting: Cardiology

## 2021-03-18 ENCOUNTER — Other Ambulatory Visit: Payer: Self-pay

## 2021-03-18 VITALS — BP 124/70 | HR 57 | Ht 65.5 in | Wt 126.4 lb

## 2021-03-18 DIAGNOSIS — R002 Palpitations: Secondary | ICD-10-CM

## 2021-03-18 NOTE — Patient Instructions (Signed)
Medication Instructions:  °Your physician recommends that you continue on your current medications as directed. Please refer to the Current Medication list given to you today. ° °*If you need a refill on your cardiac medications before your next appointment, please call your pharmacy* ° ° °Lab Work: °None ordered ° ° °Testing/Procedures: °None ordered ° ° °Follow-Up: °At [facility], you and your health needs are our priority.  As part of our continuing mission to provide you with exceptional heart care, we have created designated Provider Care Teams.  These Care Teams include your primary Cardiologist (physician) and Advanced Practice Providers (APPs -  Physician Assistants and Nurse Practitioners) who all work together to provide you with the care you need, when you need it. ° °Your next appointment:   °1 year(s) ° °The format for your next appointment:   °In Person ° °Provider:   °Will Camnitz, MD ° ° ° °Thank you for choosing [facility]!! ° ° °Ivianna Notch, RN °[phone] ° ° ° °

## 2021-03-18 NOTE — Progress Notes (Signed)
Electrophysiology Office Note   Date:  03/18/2021   ID:  Ricci, Paff Aug 01, 1981, MRN [id number]  PCP:  Garlan Fillers, MD  Cardiologist:  Bettina Gavia Primary Electrophysiologist:  Ferguson Gertner Meredith Leeds, MD    Chief Complaint: palpitations   History of Present Illness: Katherine Levine is a 40 y.o. female who is being seen today for the evaluation of palpitations at the request of Rice, Wannetta Sender, MD. Presenting today for electrophysiology evaluation.  She has a history of right peroneal nerve palsy and palpitations.  She had been having palpitations for quite some time.  ZIO monitor showed episodes of PVCs.  She also had Mobitz 1 AV block but no pauses of 3 seconds or more.  She had no atrial fibrillation, atrial flutter.  She had some abrupt change in her heart rate.  She was initially started on acebutolol but continued to have symptoms.  She is now on diltiazem.  Today, denies symptoms of palpitations, chest pain, shortness of breath, orthopnea, PND, lower extremity edema, claudication, dizziness, presyncope, syncope, bleeding, or neurologic sequela. The patient is tolerating medications without difficulties.  She is currently feeling well.  She has no chest pain or shortness of breath.  Her palpitations are much improved.  She decrease the dose to 120 mg at her last cardiology visit.  She did try to take it on an as-needed basis, but she had worsening palpitations and has restarted it.  She notes no restrictions due to her medication.  Past Medical History:  Diagnosis Date  . Dental crowns present    #8,9,25 per pt.  . History of kidney stones   . Left ACL tear 01/2017  . Vision abnormalities    Past Surgical History:  Procedure Laterality Date  . CESAREAN SECTION  2012; 2000   x 2  . CHOLECYSTECTOMY     age 71  . DILITATION & CURRETTAGE/HYSTROSCOPY WITH NOVASURE ABLATION  10/19/2012   Procedure: DILATATION & CURETTAGE/HYSTEROSCOPY WITH NOVASURE ABLATION;  Surgeon:  Princess Bruins, MD;  Location: [facility];  Service: Gynecology;  Laterality: N/A;  . HEMORRHOID SURGERY    . KNEE ARTHROSCOPY WITH ANTERIOR CRUCIATE LIGAMENT (ACL) REPAIR Left 02/19/2017   Procedure: LEFT KNEE ARTHROSCOPY WITH ANTERIOR CRUCIATE LIGAMENT (ACL) REPAIR ALLOGRAFT;  Surgeon: Renette Butters, MD;  Location: [facility];  Service: Orthopedics;  Laterality: Left;  . LIVER RESECTION  06/2011  . TUBAL LIGATION       Current Outpatient Medications  Medication Sig Dispense Refill  . cetirizine (ZYRTEC) 10 MG chewable tablet Chew 10 mg by mouth as needed.     . Cholecalciferol (VITAMIN D-1000 MAX ST) 25 MCG (1000 UT) tablet Take 1 tablet by mouth daily.    . Cyanocobalamin (VITAMIN B-12) 2500 MCG SUBL Place 1 tablet under the tongue daily.    Marland Kitchen diltiazem (CARDIZEM CD) 120 MG 24 hr capsule Take 1 capsule (120 mg total) by mouth daily. 90 capsule 3  . fluticasone (FLONASE) 50 MCG/ACT nasal spray Place into both nostrils as needed.     Marland Kitchen GOODSENSE ALL DAY ALLERGY 10 MG tablet Take 1 tablet by mouth as needed for allergies.    Marland Kitchen LORazepam (ATIVAN) 1 MG tablet Take 1 mg by mouth as needed. Prior to dental appts    . omeprazole (PRILOSEC) 40 MG capsule Take 40 mg by mouth daily.    . potassium chloride SA (KLOR-CON) 20 MEQ tablet TAKE 1 TABLET(20 MEQ) BY MOUTH DAILY 90 tablet 3  .  rizatriptan (MAXALT-MLT) 10 MG disintegrating tablet TAKE 1 TABLET BY MOUTH AS NEEDED FOR MIGRAINE, MAY REPEAT IN 2 HOURS AS NEEDED 9 tablet 11   No current facility-administered medications for this visit.    Allergies:   Amoxicillin, Azithromycin, Fexofenadine, Sertraline, Adhesive [tape], Ciprofloxacin, Meperidine hcl, and Penicillins   Social History:  The patient  reports that she has never smoked. She has never used smokeless tobacco. She reports previous alcohol use. She reports that she does not use drugs.   Family History:  The patient's family history includes Cancer in her paternal aunt;  Diabetes in her paternal aunt; Heart disease in her paternal aunt; Hypertension in her paternal aunt; Melanoma in her father; Stroke in her paternal aunt. She was adopted.   ROS:  Please see the history of present illness.   Otherwise, review of systems is positive for none.   All other systems are reviewed and negative.   PHYSICAL EXAM: VS:  BP 124/70   Pulse (!) 57   Ht 5' 5.5" (1.664 m)   Wt 126 lb 6.4 oz (57.3 kg)   SpO2 98%   BMI 20.71 kg/m  , BMI Body mass index is 20.71 kg/m. GEN: Well nourished, well developed, in no acute distress  HEENT: normal  Neck: no JVD, carotid bruits, or masses Cardiac: RRR; no murmurs, rubs, or gallops,no edema  Respiratory:  clear to auscultation bilaterally, normal work of breathing GI: soft, nontender, nondistended, + BS MS: no deformity or atrophy  Skin: warm and dry Neuro:  Strength and sensation are intact Psych: euthymic mood, full affect  EKG:  EKG is not ordered today. Personal review of the ekg ordered 01/03/21 shows sinus rhythm, rate 62  Recent Labs: No results found for requested labs within last 8760 hours.    Lipid Panel     Component Value Date/Time   LDLDIRECT 73 12/17/2010 2053     Wt Readings from Last 3 Encounters:  03/18/21 126 lb 6.4 oz (57.3 kg)  01/02/21 127 lb 1.9 oz (57.7 kg)  06/11/20 124 lb 1.3 oz (56.3 kg)      Other studies Reviewed: Additional studies/ records that were reviewed today include: TTE 2017  Review of the above records today demonstrates:  - Left ventricle: The cavity size was normal. Wall thickness was   normal. Systolic function was normal. The estimated ejection   fraction was in the range of 60% to 65%. Wall motion was normal;   there were no regional wall motion abnormalities.  Cardiac Monitor 10/07/19 personally reviewed Conclusion, rare ventricular and supraventricular ectopy.  The 2 triggered and diary events predominantly unassociated with arrhythmia.  Rare Mobitz 1 second-degree  AV block without pauses.  ASSESSMENT AND PLAN:  1.  Palpitations: Possibly due to an atrial tachycardia as she has fairly abrupt changes in her heart rate.  Currently on diltiazem.  Her diltiazem dose was decreased as she was not getting her heart rate on her treadmill.  She is able to do all of her daily activities without noticing palpitations.  No changes.   Current medicines are reviewed at length with the patient today.   The patient does not have concerns regarding her medicines.  The following changes were made today: none  Labs/ tests ordered today include:  No orders of the defined types were placed in this encounter.    Disposition:   FU with Hasna Stefanik 12 months  Signed, Missy Baksh Meredith Leeds, MD  03/18/2021 3:30 PM     [facility]  [address] [place] [address] [address] [phone] (office) [phone] (fax)

## 2021-06-20 ENCOUNTER — Ambulatory Visit: Payer: BC Managed Care – PPO

## 2021-06-20 ENCOUNTER — Telehealth: Payer: Self-pay | Admitting: Cardiology

## 2021-06-20 DIAGNOSIS — R001 Bradycardia, unspecified: Secondary | ICD-10-CM

## 2021-06-20 NOTE — Telephone Encounter (Signed)
Request reviewed by Dr. Curt Bears.  Per Dr. Curt Bears order 2 week zio monitor.  Schedule appointment after results received.  Mychart message sent to Pt advising of recommendations.  Will continue to follow.

## 2021-06-20 NOTE — Progress Notes (Unsigned)
Enrolled patient for a 14 day Zio XT  monitor to be mailed to patients home  °

## 2021-06-20 NOTE — Telephone Encounter (Signed)
Katherine Levine is calling stating she is currently at the hospital in [place]. She states they advised her to call to schedule a f/u with Dr. Curt Bears ASAP. She states she went in due to her HR getting down to the 30's. Advised her the first available is 07/29/21 and she requested this message be sent for an emergency work in, she does not wish to see a PA.

## 2021-07-01 ENCOUNTER — Encounter: Payer: Self-pay | Admitting: Student

## 2021-07-01 ENCOUNTER — Other Ambulatory Visit: Payer: Self-pay

## 2021-07-01 ENCOUNTER — Ambulatory Visit: Payer: BC Managed Care – PPO | Admitting: Student

## 2021-07-01 VITALS — BP 114/80 | HR 67 | Ht 65.5 in | Wt 122.4 lb

## 2021-07-01 DIAGNOSIS — I491 Atrial premature depolarization: Secondary | ICD-10-CM

## 2021-07-01 DIAGNOSIS — R002 Palpitations: Secondary | ICD-10-CM

## 2021-07-01 DIAGNOSIS — I493 Ventricular premature depolarization: Secondary | ICD-10-CM | POA: Diagnosis not present

## 2021-07-01 NOTE — Progress Notes (Signed)
PCP:  Kathe Becton, DO Primary Cardiologist: Shirlee More, MD Electrophysiologist: Will Meredith Leeds, MD   Katherine Levine is a 40 y.o. female seen today for Will Meredith Leeds, MD for routine electrophysiology followup.    She has a history of right peroneal nerve palsy and palpitations.  She had been having palpitations for quite some time.  ZIO monitor showed episodes of PVCs.  She also had Mobitz 1 AV block but no pauses of 3 seconds or more.  She had no atrial fibrillation, atrial flutter.  She had some abrupt change in her heart rate.  She was initially started on acebutolol but continued to have symptoms.  She is now on diltiazem.  She was working several weeks ago when she felt malaise and irregular heart rate by palpation. She put on a pulse ox which red in 30-40s. She presented to ED which showed NSR with normal rates. She is not sure if PVCs or PACs were present. She denied any other kind of viral prodrome, but later in the week saw her PCP and was having abdominal pain. Was seen in the ED that weekend for Abd pain and diarrhea. CT abd pelvis reportedly unremarkable apart from "hyperattenuation of the cardiac blood possible suggestive of anemia". Hgb stable. CMET stable as below.   She has continued to have intermittent "hard" palpitations. She remains fatigue and unable to keep up with her usual exercise. She has had mild left sided chest and shoulder discomfort, most recently today while carrying a bag into her car. Not clearly exertional.   Past Medical History:  Diagnosis Date   Dental crowns present    #8,9,25 per pt.   History of kidney stones    Left ACL tear 01/2017   Vision abnormalities    Past Surgical History:  Procedure Laterality Date   CESAREAN SECTION  2012; 2000   x 2   CHOLECYSTECTOMY     age 14   [place]  10/19/2012   Procedure: DILATATION & CURETTAGE/HYSTEROSCOPY WITH NOVASURE ABLATION;  Surgeon:  Princess Bruins, MD;  Location: [facility];  Service: Gynecology;  Laterality: N/A;   HEMORRHOID SURGERY     KNEE ARTHROSCOPY WITH ANTERIOR CRUCIATE LIGAMENT (ACL) REPAIR Left 02/19/2017   Procedure: LEFT KNEE ARTHROSCOPY WITH ANTERIOR CRUCIATE LIGAMENT (ACL) REPAIR ALLOGRAFT;  Surgeon: Renette Butters, MD;  Location: [facility];  Service: Orthopedics;  Laterality: Left;   LIVER RESECTION  06/2011   TUBAL LIGATION      Current Outpatient Medications  Medication Sig Dispense Refill   cetirizine (ZYRTEC) 10 MG chewable tablet Chew 10 mg by mouth as needed.      Cholecalciferol (VITAMIN D-1000 MAX ST) 25 MCG (1000 UT) tablet Take 1 tablet by mouth daily.     Cyanocobalamin (VITAMIN B-12) 2500 MCG SUBL Place 1 tablet under the tongue daily.     desvenlafaxine (PRISTIQ) 50 MG 24 hr tablet Take 50 mg by mouth daily.     diltiazem (CARDIZEM CD) 120 MG 24 hr capsule Take 1 capsule (120 mg total) by mouth daily. 90 capsule 3   fluticasone (FLONASE) 50 MCG/ACT nasal spray Place into both nostrils as needed.      GOODSENSE ALL DAY ALLERGY 10 MG tablet Take 1 tablet by mouth as needed for allergies.     LORazepam (ATIVAN) 1 MG tablet Take 1 mg by mouth as needed. Prior to dental appts     Magnesium 250 MG TABS Take by  mouth daily in the afternoon.     omeprazole (PRILOSEC) 40 MG capsule Take 40 mg by mouth daily.     potassium chloride SA (KLOR-CON) 20 MEQ tablet TAKE 1 TABLET(20 MEQ) BY MOUTH DAILY 90 tablet 3   rizatriptan (MAXALT-MLT) 10 MG disintegrating tablet TAKE 1 TABLET BY MOUTH AS NEEDED FOR MIGRAINE, MAY REPEAT IN 2 HOURS AS NEEDED 9 tablet 11   No current facility-administered medications for this visit.    Allergies  Allergen Reactions   Amoxicillin Itching and Nausea And Vomiting   Azithromycin Itching and Nausea And Vomiting   Fexofenadine Itching and Nausea And Vomiting   Sertraline Other (See Comments)    TREMORS   Adhesive [Tape] Rash   Ciprofloxacin Rash    Meperidine Hcl Itching and Rash   Penicillins Itching    Social History   Socioeconomic History   Marital status: Married    Spouse name: Not on file   Number of children: Not on file   Years of education: Not on file   Highest education level: Not on file  Occupational History   Not on file  Tobacco Use   Smoking status: Never   Smokeless tobacco: Never  Vaping Use   Vaping Use: Never used  Substance and Sexual Activity   Alcohol use: Not Currently    Alcohol/week: 0.0 standard drinks   Drug use: No   Sexual activity: Yes    Birth control/protection: Surgical  Other Topics Concern   Not on file  Social History Narrative   Not on file   Social Determinants of Health   Financial Resource Strain: Not on file  Food Insecurity: Not on file  Transportation Needs: Not on file  Physical Activity: Not on file  Stress: Not on file  Social Connections: Not on file  Intimate Partner Violence: Not on file    Review of Systems: All other systems reviewed and are otherwise negative except as noted above.  Physical Exam: Vitals:   07/01/21 1050  BP: 114/80  Pulse: 67  SpO2: 98%  Weight: 122 lb 6.4 oz (55.5 kg)  Height: 5' 5.5" (1.664 m)    GEN- The patient is well appearing, alert and oriented x 3 today.   HEENT: normocephalic, atraumatic; sclera clear, conjunctiva pink; hearing intact; oropharynx clear; neck supple, no JVP Lymph- no cervical lymphadenopathy Lungs- Clear to ausculation bilaterally, normal work of breathing.  No wheezes, rales, rhonchi Heart- Regular rate and rhythm, no murmurs, rubs or gallops, PMI not laterally displaced GI- soft, non-tender, non-distended, bowel sounds present, no hepatosplenomegaly Extremities- no clubbing, cyanosis, or edema; DP/PT/radial pulses 2+ bilaterally MS- no significant deformity or atrophy Skin- warm and dry, no rash or lesion Psych- euthymic mood, full affect Neuro- strength and sensation are intact  EKG is ordered.  Personal review of EKG from today shows NSR at 67 bpm with stable intervals  Additional studies reviewed include: Previous EP office notes. Recent visit notes and messages  TTE 2017 Review of the above records today demonstrates: - Left ventricle: The cavity size was normal. Wall thickness was   normal. Systolic function was normal. The estimated ejection   fraction was in the range of 60% to 65%. Wall motion was normal;   there were no regional wall motion abnormalities.   Cardiac Monitor 10/07/19, personally reviewed.  Conclusion, rare ventricular and supraventricular ectopy.  The 2 triggered and diary events predominantly unassociated with arrhythmia.  Rare Mobitz 1 second-degree AV block without pauses.  Cardiac monitor 11/2019  Rare PACs and PVCs. Symptoms were associated with blocked PACs, triggered events were PVCs/PACs  CMET 06/21/2021 LFTs WNL Cr 0.83 K 3.5  Assessment and Plan:  1. Palpitations ; PACs/PVCs In the past have felt due to atrial tachycardia with fairly abrupt changes in HR.  Overall has tolerated diltiazem well, though with recent illness/? GI issues.  She is wearing Zio currently which will hopefully shed light on the cardiac aspect of her issues. She has worn it for 10 days and continued to have symptoms. I asked her to go ahead and send today.  RTC 4-6 weeks with Dr. Curt Bears to review monitor.  Update echo with increased palpitations and malaise.  Re-assurance given that thus far her cardiac findings have been symptomatic but non-life threatening.  2. Malaise/Fatigue I think mostly likely she had a viral syndrome that caused aggravation of her known PACs/PVCs, and much less likely that this was a primarily cardiac issue. Especially given GI illness. I hesitate to make any adjustment to her meds with completely normal EKG today and pending monitor.   Labwork from care-everywhere reviewed as above.   Shirley Friar, PA-C  07/01/21 11:23 AM

## 2021-07-01 NOTE — Patient Instructions (Signed)
Medication Instructions:  Your physician recommends that you continue on your current medications as directed. Please refer to the Current Medication list given to you today.  *If you need a refill on your cardiac medications before your next appointment, please call your pharmacy*   Lab Work: None If you have labs (blood work) drawn today and your tests are completely normal, you will receive your results only by: [facility] (if you have MyChart) OR A paper copy in the mail If you have any lab test that is abnormal or we need to change your treatment, we will call you to review the results.   Testing/Procedures: Your physician has requested that you have an echocardiogram. Echocardiography is a painless test that uses sound waves to create images of your heart. It provides your doctor with information about the size and shape of your heart and how well your heart's chambers and valves are working. This procedure takes approximately one hour. There are no restrictions for this procedure.   Follow-Up: At [facility], you and your health needs are our priority.  As part of our continuing mission to provide you with exceptional heart care, we have created designated Provider Care Teams.  These Care Teams include your primary Cardiologist (physician) and Advanced Practice Providers (APPs -  Physician Assistants and Nurse Practitioners) who all work together to provide you with the care you need, when you need it.   Your next appointment:   As scheduled

## 2021-07-09 ENCOUNTER — Other Ambulatory Visit: Payer: Self-pay

## 2021-07-09 ENCOUNTER — Ambulatory Visit (HOSPITAL_COMMUNITY): Payer: BC Managed Care – PPO | Attending: Internal Medicine

## 2021-07-09 DIAGNOSIS — R002 Palpitations: Secondary | ICD-10-CM | POA: Diagnosis present

## 2021-07-09 DIAGNOSIS — I493 Ventricular premature depolarization: Secondary | ICD-10-CM

## 2021-07-09 DIAGNOSIS — I491 Atrial premature depolarization: Secondary | ICD-10-CM | POA: Diagnosis present

## 2021-07-09 LAB — ECHOCARDIOGRAM COMPLETE
Area-P 1/2: 3.72 cm2
S' Lateral: 2.3 cm

## 2021-08-20 ENCOUNTER — Ambulatory Visit: Payer: BC Managed Care – PPO | Admitting: Cardiology

## 2021-08-20 ENCOUNTER — Encounter: Payer: Self-pay | Admitting: Cardiology

## 2021-08-20 ENCOUNTER — Other Ambulatory Visit: Payer: Self-pay

## 2021-08-20 VITALS — BP 112/64 | HR 58 | Ht 65.5 in | Wt 123.0 lb

## 2021-08-20 DIAGNOSIS — R002 Palpitations: Secondary | ICD-10-CM | POA: Diagnosis not present

## 2021-08-20 NOTE — Progress Notes (Signed)
Electrophysiology Office Note   Date:  08/20/2021   ID:  Katherine Levine, Katherine Levine 12/21/1980, MRN [id number]  PCP:  Kathe Becton, DO  Cardiologist:  Bettina Gavia Primary Electrophysiologist:  Constance Haw, MD    Chief Complaint: palpitations   History of Present Illness: Katherine Levine is a 40 y.o. female who is being seen today for the evaluation of palpitations at the request of Kathe Becton, DO. Presenting today for electrophysiology evaluation.  She has a history of right peroneal nerve palsy and palpitations.  She had been having palpitations for quite some time.  She wore a ZIO monitor that showed PVCs.  She also has Mobitz 1 AV block but no pauses of longer than 3 seconds.  Her monitor showed no atrial fibrillation or atrial flutter.  She is currently on diltiazem.  She recently wore another monitor that showed sinus rhythm.  No arrhythmias were noted.  All of the triggered episodes were associated with sinus rhythm.  She had rare PACs and PVCs, all less than 1%.  Today, denies symptoms of palpitations, chest pain, shortness of breath, orthopnea, PND, lower extremity edema, claudication, dizziness, presyncope, syncope, bleeding, or neurologic sequela. The patient is tolerating medications without difficulties.  She currently feels well.  She had a few palpitations while wearing her cardiac monitor that were all associated with sinus rhythm.  She is currently feeling well.  She does note some exercise intolerance when she has the 30-minute mark of the vigorous exercise, though otherwise she has no major complaints.  Past Medical History:  Diagnosis Date   Dental crowns present    #8,9,25 per pt.   History of kidney stones    Left ACL tear 01/2017   Vision abnormalities    Past Surgical History:  Procedure Laterality Date   CESAREAN SECTION  2012; 2000   x 2   CHOLECYSTECTOMY     age 60   [place]  10/19/2012    Procedure: DILATATION & CURETTAGE/HYSTEROSCOPY WITH NOVASURE ABLATION;  Surgeon: Princess Bruins, MD;  Location: [facility];  Service: Gynecology;  Laterality: N/A;   HEMORRHOID SURGERY     KNEE ARTHROSCOPY WITH ANTERIOR CRUCIATE LIGAMENT (ACL) REPAIR Left 02/19/2017   Procedure: LEFT KNEE ARTHROSCOPY WITH ANTERIOR CRUCIATE LIGAMENT (ACL) REPAIR ALLOGRAFT;  Surgeon: Renette Butters, MD;  Location: [facility];  Service: Orthopedics;  Laterality: Left;   LIVER RESECTION  06/2011   TUBAL LIGATION       Current Outpatient Medications  Medication Sig Dispense Refill   cetirizine (ZYRTEC) 10 MG chewable tablet Chew 10 mg by mouth as needed.      Cholecalciferol (VITAMIN D-1000 MAX ST) 25 MCG (1000 UT) tablet Take 1 tablet by mouth daily.     Cyanocobalamin (VITAMIN B-12) 2500 MCG SUBL Place 1 tablet under the tongue daily.     desvenlafaxine (PRISTIQ) 50 MG 24 hr tablet Take 50 mg by mouth daily.     fluticasone (FLONASE) 50 MCG/ACT nasal spray Place into both nostrils as needed.      GOODSENSE ALL DAY ALLERGY 10 MG tablet Take 1 tablet by mouth as needed for allergies.     LORazepam (ATIVAN) 1 MG tablet Take 1 mg by mouth as needed. Prior to dental appts     Magnesium 250 MG TABS Take by mouth daily in the afternoon.     omeprazole (PRILOSEC) 40 MG capsule Take 40 mg by mouth daily.     potassium  chloride SA (KLOR-CON) 20 MEQ tablet TAKE 1 TABLET(20 MEQ) BY MOUTH DAILY 90 tablet 3   rizatriptan (MAXALT-MLT) 10 MG disintegrating tablet TAKE 1 TABLET BY MOUTH AS NEEDED FOR MIGRAINE, MAY REPEAT IN 2 HOURS AS NEEDED 9 tablet 11   No current facility-administered medications for this visit.    Allergies:   Amoxicillin, Azithromycin, Fexofenadine, Sertraline, Adhesive [tape], Ciprofloxacin, Meperidine hcl, and Penicillins   Social History:  The patient  reports that she has never smoked. She has never used smokeless tobacco. She reports that she does not currently use alcohol. She  reports that she does not use drugs.   Family History:  The patient's family history includes Cancer in her paternal aunt; Diabetes in her paternal aunt; Heart disease in her paternal aunt; Hypertension in her paternal aunt; Melanoma in her father; Stroke in her paternal aunt. She was adopted.   ROS:  Please see the history of present illness.   Otherwise, review of systems is positive for none.   All other systems are reviewed and negative.   PHYSICAL EXAM: VS:  BP 112/64   Pulse (!) 58   Ht 5' 5.5" (1.664 m)   Wt 123 lb (55.8 kg)   SpO2 98%   BMI 20.16 kg/m  , BMI Body mass index is 20.16 kg/m. GEN: Well nourished, well developed, in no acute distress  HEENT: normal  Neck: no JVD, carotid bruits, or masses Cardiac: RRR; no murmurs, rubs, or gallops,no edema  Respiratory:  clear to auscultation bilaterally, normal work of breathing GI: soft, nontender, nondistended, + BS MS: no deformity or atrophy  Skin: warm and dry Neuro:  Strength and sensation are intact Psych: euthymic mood, full affect  EKG:  EKG is ordered today. Personal review of the ekg ordered shows sinus rhythm, rate 67  Recent Labs: No results found for requested labs within last 8760 hours.    Lipid Panel     Component Value Date/Time   LDLDIRECT 73 12/17/2010 2053     Wt Readings from Last 3 Encounters:  08/20/21 123 lb (55.8 kg)  07/01/21 122 lb 6.4 oz (55.5 kg)  03/18/21 126 lb 6.4 oz (57.3 kg)      Other studies Reviewed: Additional studies/ records that were reviewed today include: TTE 2017  Review of the above records today demonstrates:  - Left ventricle: The cavity size was normal. Wall thickness was   normal. Systolic function was normal. The estimated ejection   fraction was in the range of 60% to 65%. Wall motion was normal;   there were no regional wall motion abnormalities.  Cardiac monitor 07/01/2021 personally reviewed Predominant rhythm was sinus rhythm Less than 1% ventricular  and supraventricular ectopy Triggered episodes associated with sinus rhythm  ASSESSMENT AND PLAN:  1.  Palpitations: This point there is no obvious source to her palpitations.  She wore a ZIO monitor and had multiple triggered events that were all associated with sinus rhythm.  She is overall comfortable with her control.  She does note that she is having some exercise intolerance after approximately 30 minutes.  Due to that we Darletta Noblett stop her diltiazem.  I Skila Rollins see her back as needed if she has more episodes.   Current medicines are reviewed at length with the patient today.   The patient does not have concerns regarding her medicines.  The following changes were made today: Stop diltiazem  Labs/ tests ordered today include:  No orders of the defined types were placed in this  encounter.    Disposition:   FU with Breauna Mazzeo as needed months  Signed, Aldonia Keeven Meredith Leeds, MD  08/20/2021 12:07 PM     [facility] [address] [place] [address] [phone] (office) [phone] (fax)

## 2022-03-05 ENCOUNTER — Other Ambulatory Visit: Payer: Self-pay

## 2022-03-05 MED ORDER — POTASSIUM CHLORIDE CRYS ER 20 MEQ PO TBCR
20.0000 meq | EXTENDED_RELEASE_TABLET | Freq: Every day | ORAL | 0 refills | Status: DC
Start: 2022-03-05 — End: 2022-04-07

## 2022-04-05 ENCOUNTER — Other Ambulatory Visit: Payer: Self-pay | Admitting: Cardiology

## 2022-04-07 NOTE — Telephone Encounter (Signed)
Potassium chloride SA 20 meq # 15 tablets only with message patient needs appointment for future refills / 2nd attempt ?Sent to  [id number] - [place], [place] - [address] AT [place] ?

## 2022-04-08 ENCOUNTER — Other Ambulatory Visit: Payer: Self-pay

## 2022-04-08 MED ORDER — POTASSIUM CHLORIDE CRYS ER 20 MEQ PO TBCR: 20.0000 meq | EXTENDED_RELEASE_TABLET | Freq: Every day | ORAL | 0 refills | Status: AC

## 2022-06-19 ENCOUNTER — Other Ambulatory Visit: Payer: Self-pay

## 2022-06-26 ENCOUNTER — Ambulatory Visit: Payer: BC Managed Care – PPO | Admitting: Cardiology

## 2024-08-01 ENCOUNTER — Ambulatory Visit
Admission: EM | Admit: 2024-08-01 | Discharge: 2024-08-01 | Disposition: A | Discharge status: Home / Self Care | Payer: Self-pay | Attending: Family Medicine | Admitting: Family Medicine

## 2024-08-01 DIAGNOSIS — J029 Acute pharyngitis, unspecified: Secondary | ICD-10-CM

## 2024-08-01 DIAGNOSIS — U071 COVID-19: Secondary | ICD-10-CM

## 2024-08-01 DIAGNOSIS — R051 Acute cough: Secondary | ICD-10-CM

## 2024-08-01 LAB — POC COVID19/FLU A&B COMBO
Covid Antigen, POC: POSITIVE — AB
Influenza A Antigen, POC: NEGATIVE
Influenza B Antigen, POC: NEGATIVE

## 2024-08-01 LAB — POCT RAPID STREP A (OFFICE): Rapid Strep A Screen: NEGATIVE

## 2024-08-01 MED ORDER — GUAIFENESIN ER 600 MG PO TB12: 600.0000 mg | ORAL_TABLET | Freq: Two times a day (BID) | ORAL | 0 refills | Status: AC | PRN

## 2024-08-01 MED ORDER — MOLNUPIRAVIR 200 MG PO CAPS: 4.0000 | ORAL_CAPSULE | Freq: Two times a day (BID) | ORAL | 0 refills | Status: AC

## 2024-08-01 NOTE — Discharge Instructions (Signed)
 You have tested positive for COVID-19.  Start molnupiravir  antiviral medication twice daily for 5 days.  We do Mucinex  as needed for your cough.  Lots of rest and fluids.  Continue over-the-counter Tylenol  or ibuprofen  as needed for fever body aches.  Please follow-up with your PCP if your symptoms do not improve.  Please go to the ER for any worsening symptoms.  Hope you feel better soon!
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
## 2024-08-01 NOTE — ED Triage Notes (Signed)
 Pt present with a cough and sore throat that started Friday. Today, she had a fever and body aches.   Home interventions: tylenol
